# Patient Record
Sex: Male | Born: 1960 | Race: White | Hispanic: No | Marital: Single | Smoking: Current some day smoker
Health system: Southern US, Community
[De-identification: ages and names within clinical notes are randomized; demographics above are authoritative.]

## PROBLEM LIST (undated history)

## (undated) DIAGNOSIS — I10 Essential (primary) hypertension: Secondary | ICD-10-CM

## (undated) DIAGNOSIS — B1920 Unspecified viral hepatitis C without hepatic coma: Secondary | ICD-10-CM

## (undated) DIAGNOSIS — E785 Hyperlipidemia, unspecified: Secondary | ICD-10-CM

## (undated) DIAGNOSIS — IMO0001 Reserved for inherently not codable concepts without codable children: Secondary | ICD-10-CM

## (undated) DIAGNOSIS — K759 Inflammatory liver disease, unspecified: Secondary | ICD-10-CM

## (undated) DIAGNOSIS — M1990 Unspecified osteoarthritis, unspecified site: Secondary | ICD-10-CM

## (undated) DIAGNOSIS — Z972 Presence of dental prosthetic device (complete) (partial): Secondary | ICD-10-CM

## (undated) DIAGNOSIS — K219 Gastro-esophageal reflux disease without esophagitis: Secondary | ICD-10-CM

## (undated) HISTORY — DX: Essential (primary) hypertension: I10

## (undated) HISTORY — DX: Hyperlipidemia, unspecified: E78.5

## (undated) HISTORY — DX: Gastro-esophageal reflux disease without esophagitis: K21.9

## (undated) HISTORY — PX: NO PAST SURGERIES: SHX2092

---

## 2007-09-14 ENCOUNTER — Ambulatory Visit: Payer: Self-pay | Admitting: Family Medicine

## 2007-10-25 DIAGNOSIS — K759 Inflammatory liver disease, unspecified: Secondary | ICD-10-CM

## 2007-10-25 HISTORY — DX: Inflammatory liver disease, unspecified: K75.9

## 2008-05-01 DIAGNOSIS — R5381 Other malaise: Secondary | ICD-10-CM | POA: Insufficient documentation

## 2008-06-25 ENCOUNTER — Ambulatory Visit: Payer: Self-pay | Admitting: Gastroenterology

## 2008-08-08 ENCOUNTER — Ambulatory Visit: Payer: Self-pay | Admitting: Gastroenterology

## 2009-05-15 DIAGNOSIS — M545 Low back pain, unspecified: Secondary | ICD-10-CM | POA: Insufficient documentation

## 2009-07-08 DIAGNOSIS — D619 Aplastic anemia, unspecified: Secondary | ICD-10-CM | POA: Insufficient documentation

## 2009-07-14 DIAGNOSIS — M791 Myalgia, unspecified site: Secondary | ICD-10-CM | POA: Insufficient documentation

## 2009-12-15 DIAGNOSIS — R0602 Shortness of breath: Secondary | ICD-10-CM | POA: Insufficient documentation

## 2010-06-21 ENCOUNTER — Ambulatory Visit: Payer: Self-pay | Admitting: Family Medicine

## 2010-07-24 IMAGING — US ABDOMEN ULTRASOUND
1 series · 18 of 25 positions shown · non-contrast
Comparison: none

REASON FOR EXAM: abd pain
COMMENTS:

[Series 1: abdomen ultrasound · 18 of 72 slices shown]
[im 1/72]
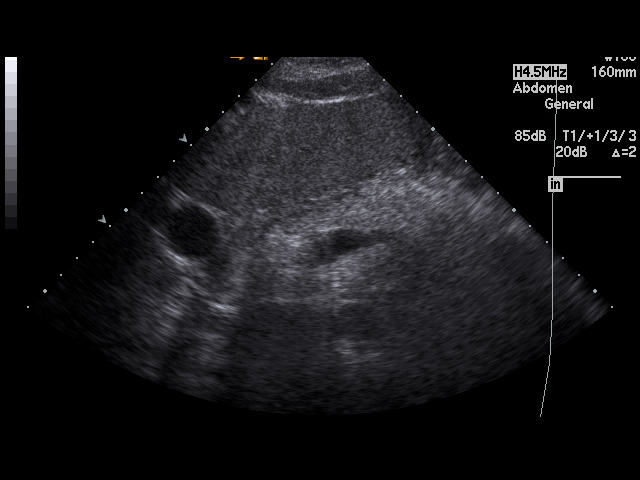
[im 6/72]
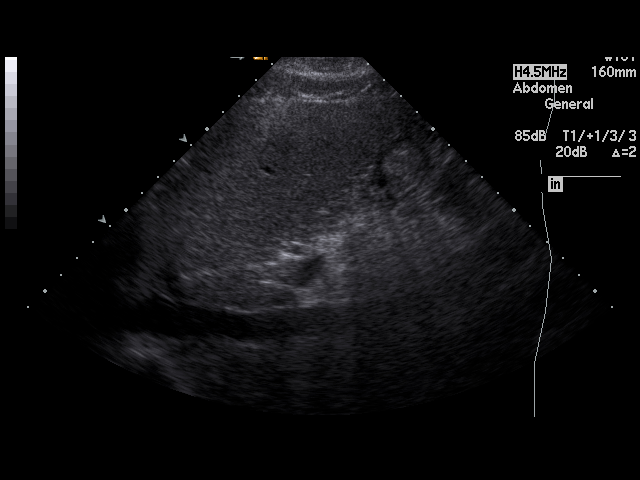
[im 9/72]
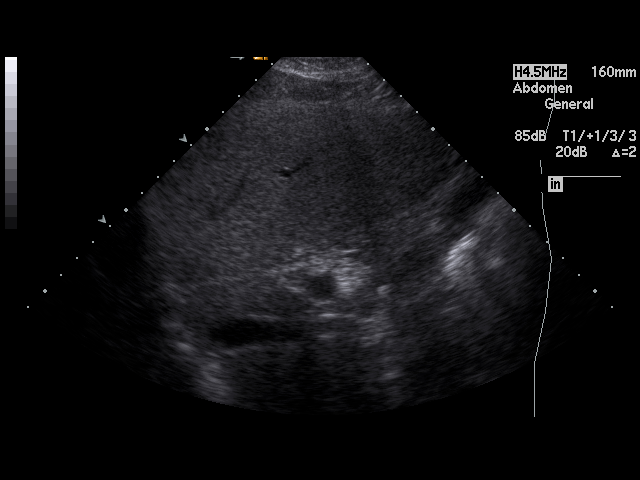
[im 12/72]
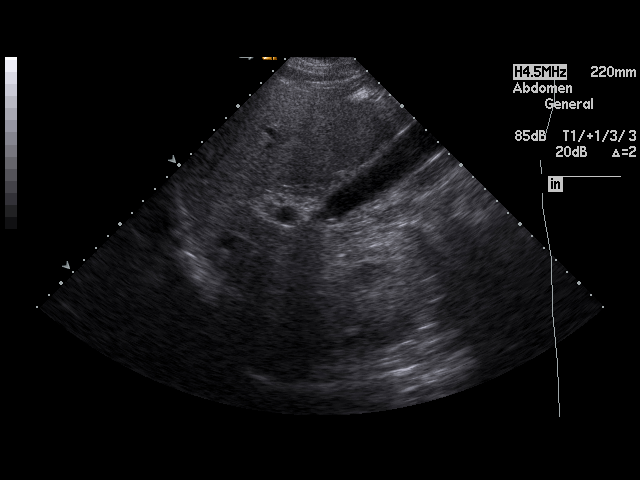
[im 18/72]
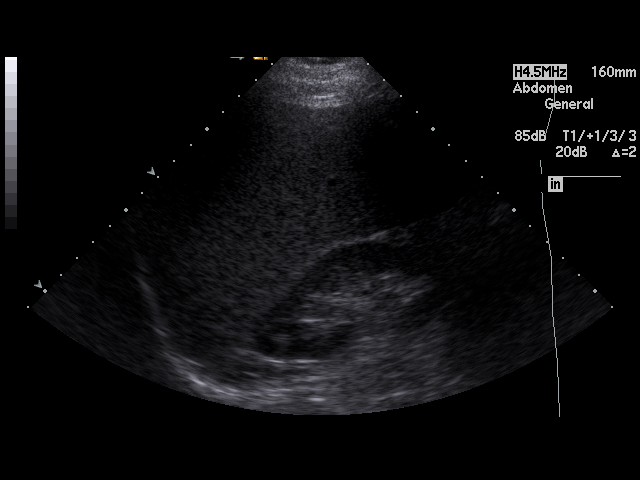
[im 21/72]
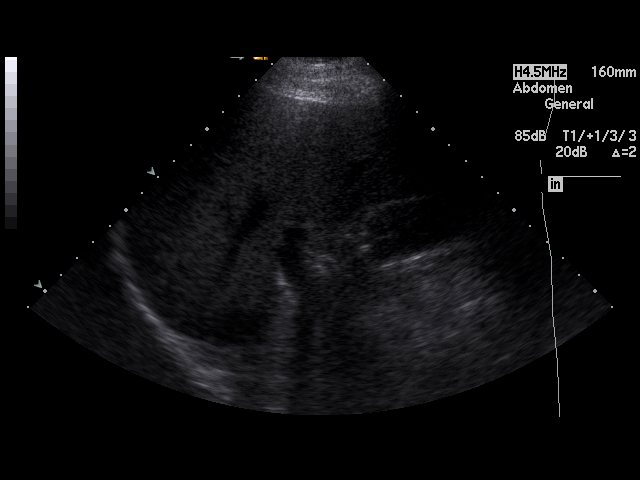
[im 27/72]
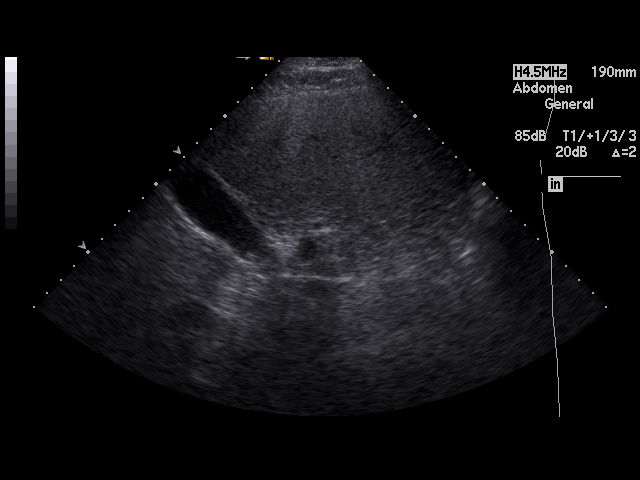
[im 30/72]
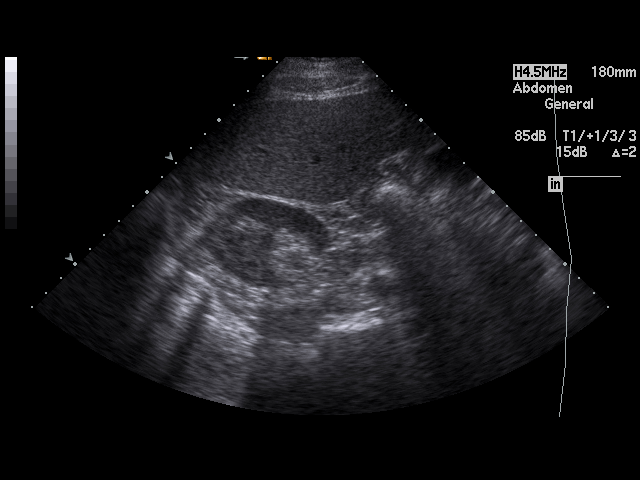
[im 33/72]
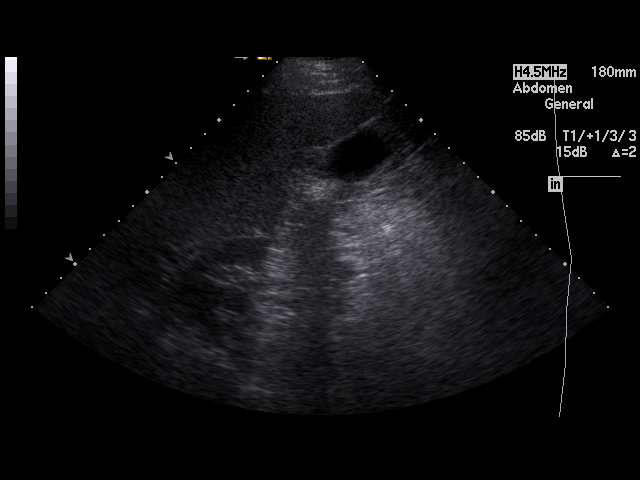
[im 39/72]
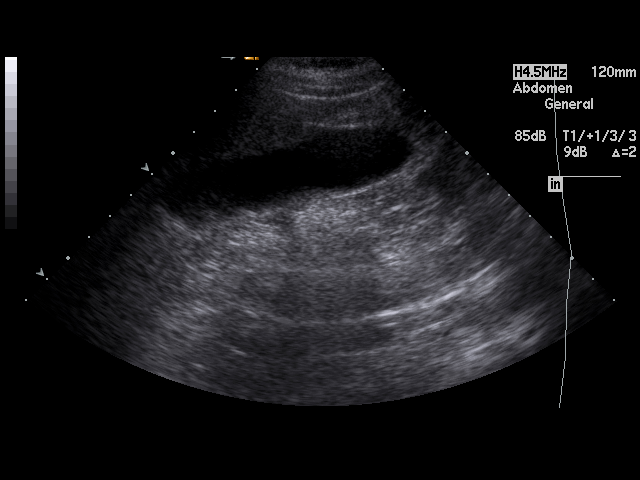
[im 42/72]
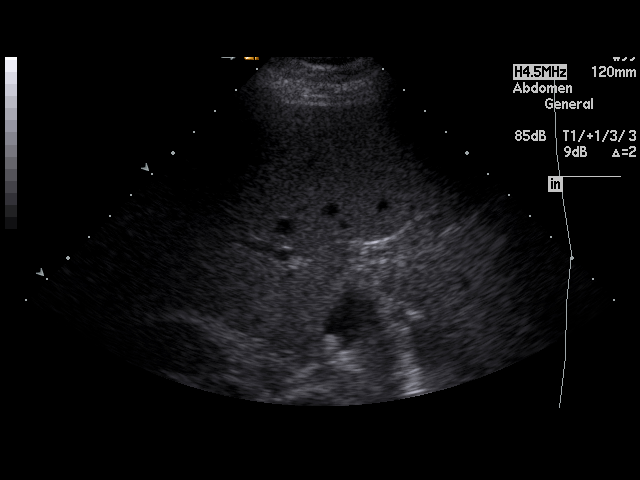
[im 45/72]
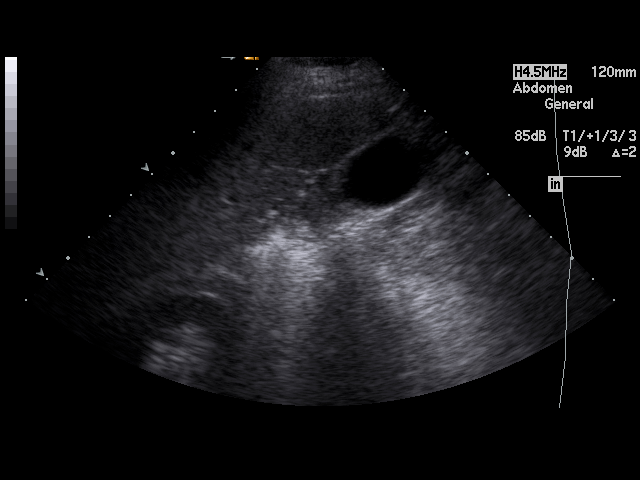
[im 51/72]
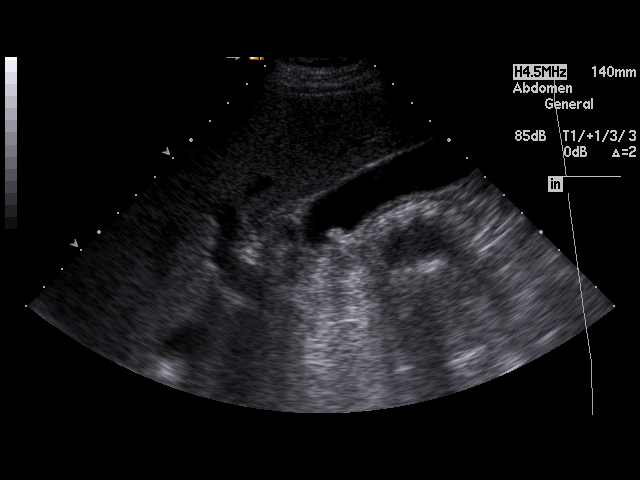
[im 54/72]
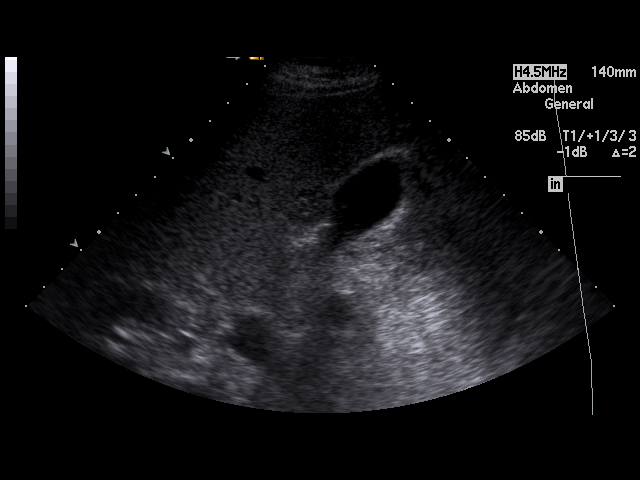
[im 60/72]
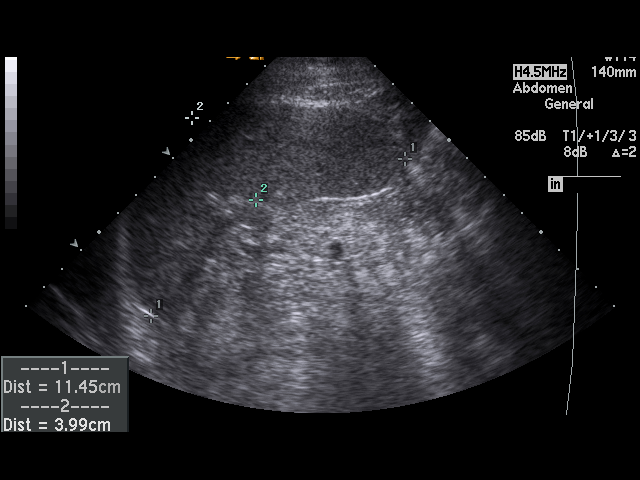
[im 63/72]
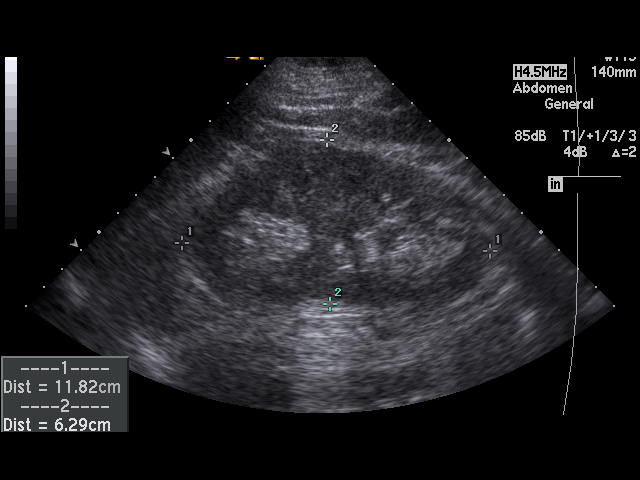
[im 66/72]
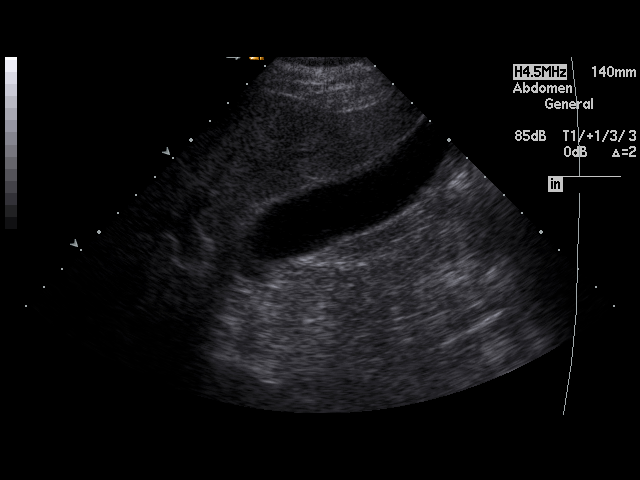
[im 72/72]
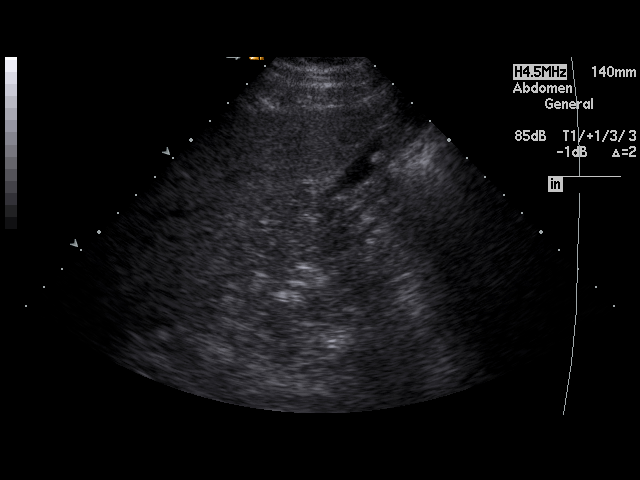

[18 of 25 positions shown; findings below may reference images not displayed]

PROCEDURE:     US  - US ABDOMEN GENERAL SURVEY  - June 25, 2008  [DATE]

RESULT:     The liver is normal.  The pancreas is partially visualized.
Gallstones are present. Gallbladder wall thickness is 1.7 mm.  The common
bile duct is 7 mm.  There is no hydronephrosis.  The RIGHT kidney measures
10.6 cm and the LEFT 11.8 cm.
IMPRESSION: Mobile gallstones.

## 2011-05-30 ENCOUNTER — Ambulatory Visit: Payer: Self-pay | Admitting: Family Medicine

## 2011-06-07 ENCOUNTER — Emergency Department: Payer: Self-pay | Admitting: Emergency Medicine

## 2012-01-06 ENCOUNTER — Emergency Department: Payer: Self-pay | Admitting: Emergency Medicine

## 2012-07-02 ENCOUNTER — Ambulatory Visit: Payer: Self-pay | Admitting: Family Medicine

## 2012-08-22 ENCOUNTER — Emergency Department: Payer: Self-pay

## 2012-08-22 LAB — URINALYSIS, COMPLETE
Ketone: NEGATIVE
Nitrite: NEGATIVE
Ph: 7 (ref 4.5–8.0)
Protein: NEGATIVE
RBC,UR: <1 /HPF (ref 0–5)
Specific Gravity: 1.002 (ref 1.003–1.030)

## 2012-08-22 LAB — CBC
HCT: 43.1 % (ref 40.0–52.0)
MCHC: 36 g/dL (ref 32.0–36.0)
RDW: 13.3 % (ref 11.5–14.5)
WBC: 7.8 10*3/uL (ref 3.8–10.6)

## 2012-08-22 LAB — COMPREHENSIVE METABOLIC PANEL
Albumin: 4.9 g/dL (ref 3.4–5.0)
Alkaline Phosphatase: 108 U/L (ref 50–136)
Anion Gap: 11 (ref 7–16)
Calcium, Total: 9.7 mg/dL (ref 8.5–10.1)
Co2: 26 mmol/L (ref 21–32)
Creatinine: 0.87 mg/dL (ref 0.60–1.30)
EGFR (African American): >60
Potassium: 3.4 mmol/L — ABNORMAL LOW (ref 3.5–5.1)
SGOT(AST): 22 U/L (ref 15–37)
SGPT (ALT): 53 U/L (ref 12–78)

## 2012-08-22 LAB — CK TOTAL AND CKMB (NOT AT ARMC)
CK, Total: 116 U/L (ref 35–232)
CK-MB: 1.4 ng/mL (ref 0.5–3.6)

## 2012-08-22 LAB — LIPASE, BLOOD: Lipase: 118 U/L (ref 73–393)

## 2013-03-14 ENCOUNTER — Ambulatory Visit: Payer: Self-pay | Admitting: Family Medicine

## 2014-01-08 DIAGNOSIS — I7300 Raynaud's syndrome without gangrene: Secondary | ICD-10-CM | POA: Insufficient documentation

## 2014-07-11 LAB — POCT ERYTHROCYTE SEDIMENTATION RATE, NON-AUTOMATED: SED RATE: 2 mm

## 2014-10-25 ENCOUNTER — Emergency Department: Payer: Self-pay | Admitting: Student

## 2014-12-15 ENCOUNTER — Ambulatory Visit: Payer: Self-pay | Admitting: Family Medicine

## 2015-02-10 NOTE — Consult Note (Signed)
PATIENT NAME:  Joshua Acevedo, Joshua Acevedo MR#:  [id number] DATE OF BIRTH:  August 05, 1961  DATE OF CONSULTATION:  08/23/2012  REFERRING PHYSICIAN:   CONSULTING PHYSICIAN:  Harrell Gave A. Jasmia Angst, MD  REASON FOR CONSULTATION: Suprapubic pain x1 day.   HISTORY OF PRESENT ILLNESS: Joshua Acevedo a pleasant 54 year old with a history of hepatitis (which he says is cured) who says that he was working today and developed suprapubic pain. He said it began on both sides. It is tender when he pushes on his abdomen and is worse with movement.  He said that his hands went numb at the same time.Marland Kitchen Has regular bowel movements, last one was today. He has never had a colonoscopy. He also is complaining of right foot pain for which he says is attributable to his gout.  Otherwise no nausea, vomiting, diarrhea, chest pain, shortness of breath, cough, fevers, or chills.   PAST MEDICAL HISTORY:  1. Hepatitis C. ? cured 2. Gout. 3. Osteoarthritis, which he says is secondary to the treatment that he got for hepatitis C.  4. Gastroesophageal reflux disease.  5. Hypercholesterolemia.   MEDICATIONS:  1. Pravastatin 20 mg p.o. daily.  2. Percocet 5/325 mg 2 tabs p.o. every 6 hours. 3. Nexium 40 mg p.o. daily.  4. Meloxicam 15 mg p.o. daily.  5. Flexeril 10 mg p.o. twice a day. 6. AndroGel topically.  7. Allopurinol 100 mg p.o. daily.   SOCIAL HISTORY: He is here with his wife. He lives in [place]. He has smoked one pack a day x30 years. Social drinker and smokes marijuana.   FAMILY HISTORY: Diabetes mellitus in his father as well as gout and coronary artery disease and hypertension in his mother.   REVIEW OF SYSTEMS: A complete 12 point review of systems was performed.  Pertinent positives and negatives as above.  PHYSICAL EXAMINATION:   VITAL SIGNS: Temperature 98.2, pulse 71, blood pressure 173/95, respirations 18, and saturation 96% on room air.   GENERAL: No acute distress, alert and oriented x3.   HEAD: Normocephalic,  atraumatic.   EYES: No scleral icterus. No conjunctivitis  FACE: Normal external ears. Normal external nose.  CHEST: Lungs clear to auscultation, moving air well.   ABDOMEN: Soft, nondistended. Tender when he pushes suprapubic, but no grimacing and no guarding and no rebound.   EXTREMITIES: Moves all extremities. Strength 5/5 in all four extremities.   PSYCH: Normal affect, pleasant mood.   LABORATORY, DIAGNOSTIC AND RADIOLOGIC DATA: I have reviewed all of Mr. Fleer' labs and his labs are significant for white cell count of 7.8 and hemoglobin and hematocrit 15.5 and 43.1. Potassium 3.4. Otherwise no lab abnormalities.   CT: I personally reviewed the CT scan. I do not visualize any acute diverticulitis, no suprapubic stranding. Appendix is visualized and appears normal size and of normal thickness, with contrast intraluminally.   Ultrasound shows gallstones with no pericholecystic fluid. No gallbladder wall thickening. Normal common bile duct. Normal thickness.  ASSESSMENT AND PLAN: Joshua Acevedo is pleasant 55 year old male with suprapubic pain. I do not see any obvious etiology to his abdominal pain at this time. I was consulted for gallstones and possible cholecystitis or symptomatic cholelithiasis. I do not see that his gallbladder is causing this problem. I have told him that he may follow up with me as an outpatient if he has gallbladder symptoms, which he does not at this time.  ____________________________ Glena Norfolk Kavan Devan, MD cal:slb D: 08/23/2012 06:46:51 ET T: 08/23/2012 09:37:35 ET JOB#: [id number]  cc: Harrell Gave A.  Collin Rengel, MD, <Dictator> Floyde Parkins MD ELECTRONICALLY SIGNED 08/24/2012 2:02

## 2015-02-27 LAB — CBC AND DIFFERENTIAL
HCT: 39 % — AB (ref 41–53)
Hemoglobin: 13.1 g/dL — AB (ref 13.5–17.5)
NEUTROS ABS: 47 /uL
Platelets: 205 10*3/uL (ref 150–399)
WBC: 7.4 10*3/mL

## 2015-02-27 LAB — HEPATIC FUNCTION PANEL
ALT: 16 U/L (ref 10–40)
AST: 19 U/L (ref 14–40)
Alkaline Phosphatase: 97 U/L (ref 25–125)
BILIRUBIN, TOTAL: 0.2 mg/dL

## 2015-02-27 LAB — LIPID PANEL
Cholesterol: 277 mg/dL — AB (ref 0–200)
HDL: 28 mg/dL — AB (ref 35–70)
LDL CALC: 219 mg/dL
TRIGLYCERIDES: 150 mg/dL (ref 40–160)

## 2015-02-27 LAB — BASIC METABOLIC PANEL
BUN: 7 mg/dL (ref 4–21)
Creatinine: 0.7 mg/dL (ref 0.6–1.3)
Glucose: 103 mg/dL
Potassium: 5.1 mmol/L (ref 3.4–5.3)
Sodium: 143 mmol/L (ref 137–147)

## 2015-02-27 LAB — TSH: TSH: 1.44 u[IU]/mL (ref 0.41–5.90)

## 2015-03-16 DIAGNOSIS — R6882 Decreased libido: Secondary | ICD-10-CM | POA: Insufficient documentation

## 2015-03-16 DIAGNOSIS — R5383 Other fatigue: Secondary | ICD-10-CM | POA: Insufficient documentation

## 2015-03-16 DIAGNOSIS — G894 Chronic pain syndrome: Secondary | ICD-10-CM | POA: Insufficient documentation

## 2015-03-16 DIAGNOSIS — R252 Cramp and spasm: Secondary | ICD-10-CM | POA: Insufficient documentation

## 2015-03-16 DIAGNOSIS — I10 Essential (primary) hypertension: Secondary | ICD-10-CM | POA: Insufficient documentation

## 2015-03-16 DIAGNOSIS — E559 Vitamin D deficiency, unspecified: Secondary | ICD-10-CM | POA: Insufficient documentation

## 2015-03-16 DIAGNOSIS — R7303 Prediabetes: Secondary | ICD-10-CM | POA: Insufficient documentation

## 2015-03-16 DIAGNOSIS — F1210 Cannabis abuse, uncomplicated: Secondary | ICD-10-CM | POA: Insufficient documentation

## 2015-03-16 DIAGNOSIS — I7300 Raynaud's syndrome without gangrene: Secondary | ICD-10-CM | POA: Insufficient documentation

## 2015-03-16 DIAGNOSIS — E7800 Pure hypercholesterolemia, unspecified: Secondary | ICD-10-CM | POA: Insufficient documentation

## 2015-03-16 DIAGNOSIS — K219 Gastro-esophageal reflux disease without esophagitis: Secondary | ICD-10-CM | POA: Insufficient documentation

## 2015-03-16 DIAGNOSIS — E291 Testicular hypofunction: Secondary | ICD-10-CM | POA: Insufficient documentation

## 2015-03-16 DIAGNOSIS — M2550 Pain in unspecified joint: Secondary | ICD-10-CM | POA: Insufficient documentation

## 2015-03-16 DIAGNOSIS — M109 Gout, unspecified: Secondary | ICD-10-CM | POA: Insufficient documentation

## 2015-03-16 DIAGNOSIS — M6530 Trigger finger, unspecified finger: Secondary | ICD-10-CM | POA: Insufficient documentation

## 2015-03-16 DIAGNOSIS — Z8261 Family history of arthritis: Secondary | ICD-10-CM | POA: Insufficient documentation

## 2015-03-16 DIAGNOSIS — R4586 Emotional lability: Secondary | ICD-10-CM | POA: Insufficient documentation

## 2015-03-26 ENCOUNTER — Other Ambulatory Visit: Payer: Self-pay | Admitting: Family Medicine

## 2015-04-03 ENCOUNTER — Other Ambulatory Visit: Payer: Self-pay | Admitting: Family Medicine

## 2015-04-03 MED ORDER — ALPRAZOLAM 0.5 MG PO TABS: 0.5000 mg | ORAL_TABLET | Freq: Three times a day (TID) | ORAL | Status: DC | PRN

## 2015-04-03 NOTE — Telephone Encounter (Signed)
Patient's wife Angie advised RX is ready for pick up.

## 2015-04-03 NOTE — Telephone Encounter (Signed)
Advise patient prescription will be at the front desk for pick up.

## 2015-04-03 NOTE — Telephone Encounter (Signed)
Patient's wife Janace Hoard) is calling requesting a refill on his Alprazolam. Please call patient @ [phone] when RX is ready for pick up or if you have any questions.

## 2015-04-14 ENCOUNTER — Other Ambulatory Visit: Payer: Self-pay | Admitting: Family Medicine

## 2015-04-14 MED ORDER — HYDROCODONE-ACETAMINOPHEN 10-325 MG PO TABS: 1.0000 | ORAL_TABLET | Freq: Four times a day (QID) | ORAL | Status: DC | PRN

## 2015-04-14 NOTE — Telephone Encounter (Signed)
Patient advised RX is ready for pick up at the front desk.

## 2015-04-14 NOTE — Telephone Encounter (Signed)
Refill printed and left at the front desk for pick up. Patient feels this routine use helps to maintain his mobility and control of chronic pain from myalgias and arthralgias secondary to Hepatitis C treatment with Interferon and Ribavirin.

## 2015-04-14 NOTE — Telephone Encounter (Signed)
Pt contacted office for refill request on the following medications:  HYDROcodone-acetaminophen (NORCO) 10-325 MG per tablet.  [id number]#[id number]

## 2015-04-17 ENCOUNTER — Encounter: Payer: Self-pay | Admitting: Family Medicine

## 2015-04-17 ENCOUNTER — Ambulatory Visit (INDEPENDENT_AMBULATORY_CARE_PROVIDER_SITE_OTHER): Payer: 59 | Admitting: Family Medicine

## 2015-04-17 ENCOUNTER — Telehealth: Payer: Self-pay | Admitting: Family Medicine

## 2015-04-17 VITALS — BP 132/76 | HR 80 | Temp 98.5°F | Resp 18 | Wt 153.8 lb

## 2015-04-17 DIAGNOSIS — M2550 Pain in unspecified joint: Secondary | ICD-10-CM | POA: Diagnosis not present

## 2015-04-17 DIAGNOSIS — L959 Vasculitis limited to the skin, unspecified: Secondary | ICD-10-CM

## 2015-04-17 NOTE — Telephone Encounter (Signed)
Please advise 

## 2015-04-17 NOTE — Telephone Encounter (Signed)
Will need to review actual differences in the yellow versus the white tablet with the pharmacist. Usually the tablets without color are less likely to have any extra inert ingredients to cause reactions. If having itching, may need to take a little Benadryl to counter any reaction. Should limit amount of salt in diet to be sure this does not cause fluid retention and swelling. Let us know if this continues over the weekend.

## 2015-04-17 NOTE — Progress Notes (Signed)
Subjective:    Patient ID: Joshua Acevedo, male    DOB: [birth year], 54 y.o.   MRN: [id number]  HPI Rash started after taking the first few doses of the new bottle of generic Norco 10-325 mg on 04-14-15. States he had to change pharmacies due to insurance requirement ([facility] to [facility]). He noticed the pills were different and did not seem to help any with his chronic pain ([facility] pills were [facility] - [facility] pills were white). Rash is painful and itchy around lower legs above ankles with tight sensation and puffiness around ankles. Wonders if the medicine is not as labeled or he is having an allergic reaction to the new generic.  Patient Active Problem List   Diagnosis Date Noted  . Arthralgia of multiple joints 03/16/2015  . Chronic pain associated with significant psychosocial dysfunction 03/16/2015  . Family history of arthritis 03/16/2015  . Fatigue 03/16/2015  . Acid reflux 03/16/2015  . Gout 03/16/2015  . Hypercholesteremia 03/16/2015  . Benign hypertension 03/16/2015  . Borderline diabetes 03/16/2015  . Decreased libido 03/16/2015  . 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 Cannabis abuse 03/16/2015  . Changeable mood 03/16/2015  . Cramp of limb 03/16/2015  . Acquired trigger finger 03/16/2015  . Raynaud's syndrome 03/16/2015  . Systemic lupus erythematosus (SLE) inhibitor 03/16/2015  . Avitaminosis D 03/16/2015  . Breathlessness on exertion 12/15/2009  . Muscle ache 07/14/2009  . Bone marrow failure 07/08/2009  . LBP (low back pain) 05/15/2009  . Hepatitis C virus infection without hepatic coma 06/02/2008  . Malaise and fatigue 05/01/2008   History  Substance Use Topics  . Smoking status: Current Every Day Smoker -- 1.00 packs/day for 40 years    Types: Cigarettes  . Smokeless tobacco: Never Used  . Alcohol Use: No     Comment: formerly was a heavy user   Past Surgical History  Procedure Laterality Date  . No past surgeries     Family History  Problem Relation Age  of Onset  . Diabetes Paternal Grandfather   . Heart attack Maternal Grandmother   . Heart attack Maternal Grandfather   . Hypertension Mother   . Cataracts Mother   . Diabetes Father   . Coronary artery disease Father    Current Outpatient Prescriptions on File Prior to Visit  Medication Sig Dispense Refill  . ALPRAZolam (XANAX) 0.5 MG tablet Take 1 tablet (0.5 mg total) by mouth 3 (three) times daily as needed. 90 tablet 1  . cholecalciferol (VITAMIN D) 1000 UNITS tablet Take 2 tablets by mouth daily.    . cyclobenzaprine (FLEXERIL) 10 MG tablet Take 1 tablet by mouth 2 (two) times daily as needed.    Marland Kitchen HYDROcodone-acetaminophen (NORCO) 10-325 MG per tablet Take 1-2 tablets by mouth 4 (four) times daily as needed. 180 tablet 0  . NIFEdipine (PROCARDIA-XL/ADALAT CC) 30 MG 24 hr tablet Take 1 tablet by mouth daily.    . Omeprazole 20 MG TBEC Take 2 tablets by mouth daily.    . pravastatin (PRAVACHOL) 40 MG tablet Take 1 tablet by mouth daily.     No current facility-administered medications on file prior to visit.   No Known Allergies     Review of Systems  Constitutional: Negative.   Respiratory: Negative.   Cardiovascular: Negative.   Gastrointestinal: Negative.   Skin: Positive for rash.  Neurological: Positive for headaches.  Psychiatric/Behavioral: Positive for agitation.      BP 132/76 mmHg  Pulse 80  Temp(Src) 98.5 F (36.9  C) (Oral)  Resp 18  Wt 153 lb 12.8 oz (69.763 kg)  Objective:   Physical Exam  Constitutional: He is oriented to person, place, and time. He appears well-developed and well-nourished. No distress.  HENT:  Head: Normocephalic and atraumatic.  Right Ear: Hearing normal.  Left Ear: Hearing normal.  Nose: Nose normal.  Eyes: Conjunctivae and lids are normal. Right eye exhibits no discharge. Left eye exhibits no discharge. No scleral icterus.  Pulmonary/Chest: Effort normal. No respiratory distress.  Musculoskeletal: Normal range of motion.   Neurological: He is alert and oriented to person, place, and time.  Skin: Skin is intact. Rash noted. No lesion noted.  Lacy red painful and itchy rash around lower legs above ankles. Ankles are puffy (R>L). Good pulses without blisters or open sores. No pain to palpate ankles or check ROM. No pitting edema.  Psychiatric: He has a normal mood and affect. His speech is normal and behavior is normal. Thought content normal.      Assessment & Plan:  1. Vasculitis of skin Onset of rash since starting a new generic Norco prescriptions on 04-14-15. No contact with new lotions, plants, foods, detergents, soaps, etc. Will treat with Triamcinolone 0.1% cream TID 30 gm and recheck in 1 week. Elevate legs as much as possible and restrict sun exposure over the weekend.  2. Arthralgia of multiple joints Pain worsening and puffiness of ankles since starting a new generic Norco prescription. Had him take the prescription back to the pharmacy to be sure the prescription was as labeled. Pharmacist advised [place] it was their generic brand of Norco (153 tablets and 8 [facility] brand Tylenol Arthritis tablets). Had him bring all the pills here and placed in disposal container for out dated samples and re-wrote prescription to pick up at a different pharmacy. With skin reaction, it is possible he had an allergic reaction or vasculitis of RA. Recheck in 1 week.

## 2015-04-17 NOTE — Telephone Encounter (Signed)
Patient advised as directed below. Patient verbalized understanding. Patient states he took the RX back to the pharmacy and the pharmacist said the difference between the white and yellow tablets are the additives in the medications.  Patient states the medication and not helping control he pain and he is swelling and has a rash since starting the medication.    Patient is requesting a new Hydrocodone RX to take to a different pharmacy even if he has to pay out of pocket for the medication. Advised patient that if Simona Huh was willing to give a new RX that he may have to bring the old RX up here for disposal because of being a narcotic. Patient verbalized understanding.

## 2015-04-17 NOTE — Telephone Encounter (Signed)
Pt states [facility] per [employer].  Pt states the Rx for Hydrocodone has changed from being a yellow pill to a white pill.  Pt states since he has been taking the white pills his legs are swollen, hand have a rash.  Pt also states the white pill is not helping with the pain.  Pt is asking if he could be having a reaction to the pills?  Pt states he can not come in today due to work.  VQ#[phone]

## 2015-04-17 NOTE — Telephone Encounter (Signed)
Pt stated his legs were swelling more and were tight. Michelle triaged the call and advised to get him in the [place] with [place] today. I scheduled pt with Simona Huh today @ 3. Thanks TNP

## 2015-04-17 NOTE — Telephone Encounter (Signed)
Pt states [facility] per [employer]. Pt states the Rx for Hydrocodone has changed from being a yellow pill to a white pill. Pt states since he has been taking the white pills his legs are swollen, hand have a rash. Pt also states the white pill is not helping with the pain. Pt is asking if he could be having a reaction to the pills? Pt states he can not come in today due to work. BD#[phone]/[id number]

## 2015-04-24 ENCOUNTER — Ambulatory Visit (INDEPENDENT_AMBULATORY_CARE_PROVIDER_SITE_OTHER): Payer: 59 | Admitting: Family Medicine

## 2015-04-24 ENCOUNTER — Telehealth: Payer: Self-pay | Admitting: Gastroenterology

## 2015-04-24 ENCOUNTER — Encounter: Payer: Self-pay | Admitting: Family Medicine

## 2015-04-24 VITALS — BP 126/72 | HR 78 | Temp 98.1°F | Resp 16 | Wt 149.0 lb

## 2015-04-24 DIAGNOSIS — E7800 Pure hypercholesterolemia, unspecified: Secondary | ICD-10-CM

## 2015-04-24 DIAGNOSIS — I10 Essential (primary) hypertension: Secondary | ICD-10-CM | POA: Diagnosis not present

## 2015-04-24 DIAGNOSIS — I776 Arteritis, unspecified: Secondary | ICD-10-CM

## 2015-04-24 NOTE — Telephone Encounter (Signed)
pcp Lelon Huh Triage They have the insurance corrected now

## 2015-04-24 NOTE — Progress Notes (Signed)
Subjective:     Patient ID: Joshua Acevedo, male   DOB: Mar 11, 1961, 54 y.o.   MRN: [id number]  Rash This is a new problem. The current episode started 1 to 4 weeks ago. The problem has been resolved since onset. It is unknown if there was an exposure to a precipitant. Pertinent negatives include no fever. (Patient reports that joint pain has improved ) The treatment provided significant relief.  Rash has resolve and only some tan coloration remains.  Hyperlipidemia Reports tolerating Pravastatin without additional arthralgias or myalgias (has chronic muscle and joint pains since treatment of Hepatitis C with Ribavirin and Interferon). Requests recheck of lipid panel to check progress. Patient Active Problem List   Diagnosis Date Noted  . Arthralgia of multiple joints 03/16/2015  . Chronic pain associated with significant psychosocial dysfunction 03/16/2015  . Family history of arthritis 03/16/2015  . Fatigue 03/16/2015  . Acid reflux 03/16/2015  . Gout 03/16/2015  . Hypercholesteremia 03/16/2015  . Benign hypertension 03/16/2015  . Borderline diabetes 03/16/2015  . Decreased libido 03/16/2015  . 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 Cannabis abuse 03/16/2015  . Changeable mood 03/16/2015  . Cramp of limb 03/16/2015  . Acquired trigger finger 03/16/2015  . Raynaud's syndrome 03/16/2015  . Systemic lupus erythematosus (SLE) inhibitor 03/16/2015  . Avitaminosis D 03/16/2015  . Breathlessness on exertion 12/15/2009  . Muscle ache 07/14/2009  . Bone marrow failure 07/08/2009  . LBP (low back pain) 05/15/2009  . Hepatitis C virus infection without hepatic coma 06/02/2008  . Malaise and fatigue 05/01/2008   Past Surgical History  Procedure Laterality Date  . No past surgeries     History  Substance Use Topics  . Smoking status: Current Every Day Smoker -- 1.00 packs/day for 40 years    Types: Cigarettes  . Smokeless tobacco: Never Used  . Alcohol Use: No     Comment: formerly was a  heavy user   Family History  Problem Relation Age of Onset  . Diabetes Paternal Grandfather   . Heart attack Maternal Grandmother   . Heart attack Maternal Grandfather   . Hypertension Mother   . Cataracts Mother   . Diabetes Father   . Coronary artery disease Father    Current Outpatient Prescriptions on File Prior to Visit  Medication Sig Dispense Refill  . ALPRAZolam (XANAX) 0.5 MG tablet Take 1 tablet (0.5 mg total) by mouth 3 (three) times daily as needed. 90 tablet 1  . cholecalciferol (VITAMIN D) 1000 UNITS tablet Take 2 tablets by mouth daily.    . cyclobenzaprine (FLEXERIL) 10 MG tablet Take 1 tablet by mouth 2 (two) times daily as needed.    Marland Kitchen NIFEdipine (PROCARDIA-XL/ADALAT CC) 30 MG 24 hr tablet Take 1 tablet by mouth daily.    . Omeprazole 20 MG TBEC Take 2 tablets by mouth daily.    . pravastatin (PRAVACHOL) 40 MG tablet Take 1 tablet by mouth daily.    Marland Kitchen HYDROcodone-acetaminophen (NORCO) 10-325 MG per tablet Take 1-2 tablets by mouth 4 (four) times daily as needed. (Patient not taking: Reported on 04/24/2015) 180 tablet 0   No current facility-administered medications on file prior to visit.   No Known Allergies  Review of Systems  Constitutional: Negative.  Negative for fever.  HENT: Negative.   Respiratory: Negative.   Cardiovascular: Negative.   Gastrointestinal: Negative.   Musculoskeletal: Positive for back pain and arthralgias.  Skin: Positive for rash.  Neurological: Negative.      BP 126/72 mmHg  Pulse 78  Temp(Src) 98.1 F (36.7 C)  Resp 16  Wt 149 lb (67.586 kg)   Objective:   Physical Exam  Constitutional: He is oriented to person, place, and time. He appears well-developed and well-nourished.  HENT:  Head: Normocephalic and atraumatic.  Nose: Nose normal.  Eyes: EOM are normal. Pupils are equal, round, and reactive to light.  Cardiovascular: Normal rate and regular rhythm.   Pulmonary/Chest: Effort normal and breath sounds normal.   Abdominal: Soft. Bowel sounds are normal.  Musculoskeletal:  No joint swelling. Ache and soreness of calves and feet today. Normal pulses.  Neurological: He is alert and oriented to person, place, and time.  Skin: Skin is warm and dry. Rash noted.  Vasculitis of lower legs fading with slight tan coloration remaining. No tenderness of skin.  Psychiatric:  Irritated but not anxious or angry today.      Assessment:     Vasculitis Discomfort and rash fading since he changed from the generic Norco that had a yellow dye in the tablets back to a plain white tablet of generic Norco and using Triamcinolone cream.  Hypercholesterolemia States he is tolerating Pravastatin and concerned about progress.  Lab Results  Component Value Date   CHOL 277* 02/27/2015   Lab Results  Component Value Date   HDL 28* 02/27/2015   Lab Results  Component Value Date   LDLCALC 219 02/27/2015   Lab Results  Component Value Date   TRIG 150 02/27/2015   Benign Hypertension    Tolerating Nifedipine (initially started for Raynaud's Phenomenon of fingers - no recurrence since established on this medication). No peripheral edema. Plan:    1. Vasculitis Resolving. Will recheck CBC for changes of infection. - CBC with Differential/Platelet  2. Hypercholesteremia Tolerating medications. Continue present dosage and work harder on low fat diet. Recheck lipids and metabolic panel with history of hepatitis. - Lipid panel - Comprehensive metabolic panel  3. Benign hypertension Very well controlled blood pressure. Continue Nifedipine and recheck pending lab reports. BP Readings from Last 3 Encounters:  04/24/15 126/72  04/17/15 132/76  12/15/14 132/82    - CBC with Differential/Platelet

## 2015-04-25 LAB — LIPID PANEL
Chol/HDL Ratio: 11 ratio units — ABNORMAL HIGH (ref 0.0–5.0)
Cholesterol, Total: 308 mg/dL — ABNORMAL HIGH (ref 100–199)
HDL: 28 mg/dL — AB (ref >39)
LDL CALC: 241 mg/dL — AB (ref 0–99)
Triglycerides: 195 mg/dL — ABNORMAL HIGH (ref 0–149)
VLDL Cholesterol Cal: 39 mg/dL (ref 5–40)

## 2015-04-25 LAB — COMPREHENSIVE METABOLIC PANEL
A/G RATIO: 1.8 (ref 1.1–2.5)
ALT: 28 IU/L (ref 0–44)
AST: 21 IU/L (ref 0–40)
Albumin: 4.8 g/dL (ref 3.5–5.5)
Alkaline Phosphatase: 112 IU/L (ref 39–117)
BUN/Creatinine Ratio: 8 — ABNORMAL LOW (ref 9–20)
BUN: 5 mg/dL — ABNORMAL LOW (ref 6–24)
CALCIUM: 10 mg/dL (ref 8.7–10.2)
CHLORIDE: 98 mmol/L (ref 97–108)
CO2: 30 mmol/L — ABNORMAL HIGH (ref 18–29)
Creatinine, Ser: 0.62 mg/dL — ABNORMAL LOW (ref 0.76–1.27)
GFR calc non Af Amer: 112 mL/min/{1.73_m2} (ref >59)
GFR, EST AFRICAN AMERICAN: 129 mL/min/{1.73_m2} (ref >59)
GLOBULIN, TOTAL: 2.6 g/dL (ref 1.5–4.5)
GLUCOSE: 68 mg/dL (ref 65–99)
Potassium: 4.4 mmol/L (ref 3.5–5.2)
Sodium: 142 mmol/L (ref 134–144)
Total Protein: 7.4 g/dL (ref 6.0–8.5)

## 2015-04-25 LAB — CBC WITH DIFFERENTIAL/PLATELET
BASOS ABS: 0 10*3/uL (ref 0.0–0.2)
BASOS: 0 %
EOS (ABSOLUTE): 0.1 10*3/uL (ref 0.0–0.4)
Eos: 1 %
HEMATOCRIT: 39 % (ref 37.5–51.0)
Hemoglobin: 13.5 g/dL (ref 12.6–17.7)
IMMATURE GRANULOCYTES: 0 %
Immature Grans (Abs): 0 10*3/uL (ref 0.0–0.1)
LYMPHS ABS: 3.5 10*3/uL — AB (ref 0.7–3.1)
Lymphs: 43 %
MCH: 30.3 pg (ref 26.6–33.0)
MCHC: 34.6 g/dL (ref 31.5–35.7)
MCV: 88 fL (ref 79–97)
Monocytes Absolute: 0.5 10*3/uL (ref 0.1–0.9)
Monocytes: 7 %
Neutrophils Absolute: 3.9 10*3/uL (ref 1.4–7.0)
Neutrophils: 49 %
PLATELETS: 340 10*3/uL (ref 150–379)
RBC: 4.45 x10E6/uL (ref 4.14–5.80)
RDW: 13.8 % (ref 12.3–15.4)
WBC: 8 10*3/uL (ref 3.4–10.8)

## 2015-04-30 ENCOUNTER — Telehealth: Payer: Self-pay | Admitting: Family Medicine

## 2015-04-30 ENCOUNTER — Telehealth: Payer: Self-pay

## 2015-04-30 DIAGNOSIS — E785 Hyperlipidemia, unspecified: Secondary | ICD-10-CM

## 2015-04-30 MED ORDER — ATORVASTATIN CALCIUM 20 MG PO TABS: 20.0000 mg | ORAL_TABLET | Freq: Every day | ORAL | Status: DC

## 2015-04-30 NOTE — Telephone Encounter (Signed)
Patient advised as directed below. Patient verbalized understanding and agrees with treatment plan. Patient states he takes the Pravastatin on a daily basis. Patient agrees to switch to Atorvastatin 40 mg. Pharmacy- [place] [facility].

## 2015-04-30 NOTE — Telephone Encounter (Signed)
Change sent to his pharmacy.

## 2015-04-30 NOTE — Telephone Encounter (Signed)
Pt is asking if the Rx for atorvastatin (LIPITOR) 20 MG is correct?  Pt states he was taking a different Rx that was 40mg .  Pharmacy only gave him 20mg .  [id number]#[phone]

## 2015-04-30 NOTE — Telephone Encounter (Signed)
LMTCB

## 2015-04-30 NOTE — Telephone Encounter (Signed)
-----   Message from [place], [place] sent at 04/30/2015 12:56 AM EDT ----- Normal liver function tests and no signs of anemia or infection. Cholesterol and triglycerides worse than last check 2 months ago. If taking Pravastatin 40 mg qd on a regular basis with lowering fats in diet, will need to switch to Atorvastatin 40 mg qd and recheck blood levels in 3 months.

## 2015-05-01 MED ORDER — ATORVASTATIN CALCIUM 40 MG PO TABS: 40.0000 mg | ORAL_TABLET | Freq: Every day | ORAL | Status: DC

## 2015-05-01 NOTE — Telephone Encounter (Signed)
Please advise. Previous message states will switch to Atorvastatin 40 mg.

## 2015-05-01 NOTE — Telephone Encounter (Signed)
Per Bartolo Darter for Atorvastatin 40 mg tablets sent to [facility]. Patient is aware that the RX should have been 40 mg instead of 20 mg.

## 2015-05-01 NOTE — Telephone Encounter (Signed)
Advised pharmacy the Atorvastatin order should have been 40 mg qd.

## 2015-05-07 NOTE — Telephone Encounter (Signed)
LVM for pt to return my call to schedule colonoscopy.  

## 2015-05-14 ENCOUNTER — Other Ambulatory Visit: Payer: Self-pay | Admitting: Family Medicine

## 2015-05-14 ENCOUNTER — Telehealth: Payer: Self-pay | Admitting: Family Medicine

## 2015-05-14 MED ORDER — HYDROCODONE-ACETAMINOPHEN 10-325 MG PO TABS: 1.0000 | ORAL_TABLET | Freq: Four times a day (QID) | ORAL | Status: DC | PRN

## 2015-05-14 NOTE — Telephone Encounter (Signed)
HYDROcodone-acetaminophen (NORCO) 10-325 MG per tablet needs written refill

## 2015-05-15 NOTE — Telephone Encounter (Signed)
Pt advised-aa 

## 2015-05-15 NOTE — Telephone Encounter (Signed)
See below

## 2015-05-15 NOTE — Telephone Encounter (Signed)
Prescription written and placed at the front desk file for pick-up.

## 2015-05-22 ENCOUNTER — Other Ambulatory Visit: Payer: Self-pay

## 2015-05-22 MED ORDER — CYCLOBENZAPRINE HCL 10 MG PO TABS: 10.0000 mg | ORAL_TABLET | Freq: Two times a day (BID) | ORAL | Status: DC | PRN

## 2015-05-22 NOTE — Telephone Encounter (Signed)
Patient's wife requesting a refill for Flexeril. Order pulled down and waiting for your approval-aa

## 2015-05-31 ENCOUNTER — Other Ambulatory Visit: Payer: Self-pay | Admitting: Family Medicine

## 2015-06-01 ENCOUNTER — Other Ambulatory Visit: Payer: Self-pay | Admitting: Family Medicine

## 2015-06-01 DIAGNOSIS — R4586 Emotional lability: Secondary | ICD-10-CM

## 2015-06-01 DIAGNOSIS — G894 Chronic pain syndrome: Secondary | ICD-10-CM

## 2015-06-01 MED ORDER — ALPRAZOLAM 0.5 MG PO TABS: 0.5000 mg | ORAL_TABLET | Freq: Three times a day (TID) | ORAL | Status: DC | PRN

## 2015-06-01 NOTE — Telephone Encounter (Signed)
Pt's wife contacted office for refill request on the following medications: ALPRAZolam (XANAX) 0.5 MG tablet to [facility]. Thanks TNP

## 2015-06-02 NOTE — Telephone Encounter (Signed)
Patient advised RX is at the front desk for pickup.  

## 2015-06-11 ENCOUNTER — Telehealth: Payer: Self-pay | Admitting: *Deleted

## 2015-06-11 ENCOUNTER — Telehealth: Payer: Self-pay | Admitting: Family Medicine

## 2015-06-11 DIAGNOSIS — G894 Chronic pain syndrome: Secondary | ICD-10-CM

## 2015-06-11 NOTE — Telephone Encounter (Signed)
Pt's wife is requesting a refill for HYDROcodone-acetaminophen ([place]) 10-325 MG per tablet because pt will run out Sunday and will need this for work on Monday. They would like to pick up the RX tomorrow 06/12/15. Thanks TNP

## 2015-06-11 NOTE — Telephone Encounter (Signed)
-----   Message from Birdie Sons, MD sent at 06/10/2015  4:16 PM EDT ----- Regarding: Appointment This is a patient of Dennis's that scheduled an appointment with me on Friday 8-19 because he wants me to prescribe him more pain medications.   Please advise patient that we would be happy to refer him to pain management clinic, but I will not be writing him prescriptions for any pain medications. Please cancel his appointment for Friday. May send order to [facility] for referral to pain clinic if he likes.

## 2015-06-11 NOTE — Telephone Encounter (Signed)
Patient's wife Janace Hoard was given the message. Wife stated that pt would like to see pain management.

## 2015-06-12 ENCOUNTER — Other Ambulatory Visit: Payer: Self-pay | Admitting: Family Medicine

## 2015-06-12 ENCOUNTER — Ambulatory Visit: Payer: 59 | Admitting: Family Medicine

## 2015-06-12 ENCOUNTER — Telehealth: Payer: Self-pay | Admitting: Family Medicine

## 2015-06-12 DIAGNOSIS — M2550 Pain in unspecified joint: Secondary | ICD-10-CM

## 2015-06-12 MED ORDER — HYDROCODONE-ACETAMINOPHEN 10-325 MG PO TABS: 1.0000 | ORAL_TABLET | Freq: Four times a day (QID) | ORAL | Status: DC | PRN

## 2015-06-12 NOTE — Telephone Encounter (Signed)
Pt's wife calling to see if husband's prescription is ready for pick up CB# [phone]. CC  HYDROcodone-acetaminophen (NORCO) 10-325 MG per tablet

## 2015-06-12 NOTE — Telephone Encounter (Signed)
Refilled and must proceed with Pain Management referral.

## 2015-06-12 NOTE — Telephone Encounter (Signed)
Patient will be out of medication over the weekend.

## 2015-06-12 NOTE — Telephone Encounter (Signed)
Pt called to request refill. I advised his wife requested it yesterday. Thanks TNP

## 2015-06-23 ENCOUNTER — Other Ambulatory Visit: Payer: Self-pay | Admitting: Family Medicine

## 2015-06-23 NOTE — Telephone Encounter (Signed)
Needs refill on NIFEdipine (PROCARDIA-XL/ADALAT CC) 30 MG 24 hr tablet 06/01/15 -- Margo Common, PA TAKE 1 TABLET BY MOUTH EVERY DAY  Uses [facility]  Thanks Con Memos

## 2015-06-23 NOTE — Telephone Encounter (Signed)
Patient's wife advised [facility] with 3 additional refills. Wife states they use a different pharmacy now. She will call to get the RX transferred to [facility]. If she is not able to transfer RX she will call back.

## 2015-06-25 ENCOUNTER — Encounter: Payer: Self-pay | Admitting: Pain Medicine

## 2015-06-25 ENCOUNTER — Telehealth: Payer: Self-pay | Admitting: Family Medicine

## 2015-06-25 ENCOUNTER — Ambulatory Visit: Payer: 59 | Attending: Pain Medicine | Admitting: Pain Medicine

## 2015-06-25 VITALS — BP 149/88 | HR 90 | Temp 98.4°F | Resp 18 | Ht 66.0 in | Wt 150.0 lb

## 2015-06-25 DIAGNOSIS — R7309 Other abnormal glucose: Secondary | ICD-10-CM | POA: Diagnosis not present

## 2015-06-25 DIAGNOSIS — B1920 Unspecified viral hepatitis C without hepatic coma: Secondary | ICD-10-CM | POA: Insufficient documentation

## 2015-06-25 DIAGNOSIS — F17200 Nicotine dependence, unspecified, uncomplicated: Secondary | ICD-10-CM | POA: Diagnosis not present

## 2015-06-25 DIAGNOSIS — M797 Fibromyalgia: Secondary | ICD-10-CM | POA: Insufficient documentation

## 2015-06-25 DIAGNOSIS — I7300 Raynaud's syndrome without gangrene: Secondary | ICD-10-CM | POA: Insufficient documentation

## 2015-06-25 DIAGNOSIS — M542 Cervicalgia: Secondary | ICD-10-CM | POA: Diagnosis present

## 2015-06-25 DIAGNOSIS — M329 Systemic lupus erythematosus, unspecified: Secondary | ICD-10-CM | POA: Insufficient documentation

## 2015-06-25 DIAGNOSIS — I10 Essential (primary) hypertension: Secondary | ICD-10-CM | POA: Insufficient documentation

## 2015-06-25 DIAGNOSIS — M069 Rheumatoid arthritis, unspecified: Secondary | ICD-10-CM | POA: Insufficient documentation

## 2015-06-25 DIAGNOSIS — M546 Pain in thoracic spine: Secondary | ICD-10-CM | POA: Diagnosis present

## 2015-06-25 NOTE — Progress Notes (Signed)
Safety precautions to be maintained throughout the outpatient stay will include: orient to surroundings, keep bed in low position, maintain call bell within reach at all times, provide assistance with transfer out of bed and ambulation.  

## 2015-06-25 NOTE — Patient Instructions (Signed)
PLAN   Continue present medications  F/U PCP Dr Natale Milch   for evaliation of  BP and general medical  condition  F/U surgical evaluation. May consider pending follow-up evaluations  F/U neurological evaluation. May consider pending follow-up evaluations  F/U rheumatological evaluation   May consider radiofrequency rhizolysis or intraspinal procedures pending response to present treatment and F/U evaluation   Patient to call Pain Management Center should patient have concerns prior to scheduled return appointment.

## 2015-06-25 NOTE — Progress Notes (Signed)
Subjective:    Patient ID: Joshua Acevedo, male    DOB: 21-Jan-1961, 54 y.o.   MRN: [id number]  HPI  Patient is 54 year old gentleman who comes to pain management Center at the request of Dr. Lelon Huh for further evaluation and treatment of pain involving the neck and entire back upper and lower extremity regions as well as headaches. Patient has diagnosis of systemic lupus erythematosus  Raynaud's syndrome osteoarthritis hepatitis C with multiple arthralgias and myalgias. Patient described his pain as aching agonizing unknowing cramping cool sensation that was toothache-like sensation and uncomfortable patient's pain was awakens him from sleep and is associated with spasms as well as weakness patient states the pain increases with sitting standing squatting stooping bending kneeling lifting walking and working. Pain has decreased with warm showers and baths and medications we discussed patient's condition on today's visit and will consider modifications of treatment regimen pending further assessment of patient's condition. We will reevaluate patient at time return appointment as well as discussed his medications and will consider treatment of patient or referral to a tertiary center. The patient was understanding and agreement with suggested treatment plan       Review of Systems   Cardiovascular High blood pressure  Pulmonary Cigarette smoking  Neurological Unremarkable  Psychological Unremarkable  Gastrointestinal Hepatitis C  Genitourinary Unremarkable  Endocrine Unremarkable  Rheumatological Osteoarthritis Rheumatoid arthritis  Musculoskeletal Unremarkable  Other significant Unremarkable       Objective:   Physical Exam  There was tends to palpation of the splenius capitis and occipitalis musculature regions of moderate degree. No new masses of the head and neck were noted. Patient appeared to be with slightly decreased grip strength. Tinel and Phalen's  maneuver were without increased pain of significant degree. There was tenderness over the cervical facet cervical paraspinal musculature region of moderate degree. There was moderate tenderness of the thoracic facet thoracic paraspinal musculature region. No crepitus of the thoracic region was noted. Palpation of the lumbar paraspinal musculature region lumbar facet region associated with increased pain of moderate degree with lateral bending and rotation and extension and palpation over the lumbar facets reproducing moderate discomfort. There was tenderness over the PSIS and PII S regions as well as the gluteal and piriformis musculature regions of moderate degree as well there was moderate tenderness of the greater trochanteric region iliotibial band region. Straight leg raising was tolerates approximately 30 without a definite increased pain with dorsiflexion noted. There was negative clonus negative Homans. DTRs were difficult to elicit patient had difficulty relaxing. Abdomen soft nontender no costovertebral angle tenderness was noted.    Assessment & Plan:    Systemic lupus erythematosus Raynaud's syndrome Fibromyalgia Osteoarthritis Hepatitis C Borderline diabetes mellitus   PLAN   Continue present medications  F/U PCP Dr. Caryn Section for evaliation of  BP and general medical  condition  F/U surgical evaluation. May consider pending follow-up evaluations  F/U neurological evaluation. May consider pending follow-up evaluations  Rheumatological evaluation as discussed  Psych evaluation was addressed on today's visit. Patient is with history of marijuana use. We informed patient that patient must discontinue the use of marijuana if he wishes to be treated in a pain management Center. The patient stated that he would stop marijuana use. We discussed psych evaluation and will observe urine drug screens and consider further evaluation of patient or discharge patient from [facility]  pending follow-up evaluations. Patient must discontinue use of marijuana if patient wishes to be treated and Pain Management  Center. Patient is understanding and agrees with suggested plan of treatment  May consider radiofrequency rhizolysis or intraspinal procedures pending response to present treatment and F/U evaluation   Patient to call Pain Management Center should patient have concerns prior to scheduled return appointment.

## 2015-06-25 NOTE — Telephone Encounter (Signed)
Pt's wife called to see if we had heard anything from the pain clinic. Wife stated he went there this morning and was told they wanted [place] to change pt's medications and they were going to send [place] a fax. Wife wanted to know if we had received any information & if we had any medications ready to be picked up. Thanks TNP

## 2015-06-26 NOTE — Telephone Encounter (Signed)
Not received any recommendations for changes yet. Dr. Primus Bravo may make changes at his follow up appointment.

## 2015-06-26 NOTE — Telephone Encounter (Signed)
Pt called again to see if we had heard anything form the pain clinic.

## 2015-07-02 ENCOUNTER — Telehealth: Payer: Self-pay

## 2015-07-02 ENCOUNTER — Telehealth: Payer: Self-pay | Admitting: Pain Medicine

## 2015-07-02 ENCOUNTER — Other Ambulatory Visit: Payer: Self-pay | Admitting: Pain Medicine

## 2015-07-02 NOTE — Telephone Encounter (Signed)
Pt would like to talk to someone about meds

## 2015-07-02 NOTE — Telephone Encounter (Signed)
Patient asking if we have heard from Dr. Mattie Marlin office about prescribing meds. Informed patient that we did get permission from Dr. Freddi Starr to prescribe medications.

## 2015-07-02 NOTE — Telephone Encounter (Signed)
Patient's wife Anajette Hirata advised as directed below. Wife verbalized understanding, and states she will advise patient of message below.

## 2015-07-02 NOTE — Telephone Encounter (Signed)
Patient states he has a follow up appointment with Dr. Primus Bravo on 07/28/15. Patient states that his Hydrocodone will run out on 9/18. Patient is requesting a RX until he sees Dr. Primus Bravo on 10/4. Please advise.

## 2015-07-02 NOTE — Telephone Encounter (Signed)
If Dr. Primus Bravo agrees, I could have it ready for pick up on 07-10-15.

## 2015-07-02 NOTE — Telephone Encounter (Signed)
Contacted [facility] pain clinic spoke with [place]. Per Tye Maryland patient does have a follow up appointment on 07/28/15, but she did not see in patient's chart where Dr. Primus Bravo agreed to start prescribing patient's medication at that time. Tye Maryland states typically Dr. Primus Bravo doe not start prescribing/treatment until after the first few office visits. Dr. Primus Bravo will send a documentation to [facility] when/if he starts prescribing for the patient.

## 2015-07-10 ENCOUNTER — Telehealth: Payer: Self-pay | Admitting: Pain Medicine

## 2015-07-10 MED ORDER — HYDROCODONE-ACETAMINOPHEN 10-325 MG PO TABS: 1.0000 | ORAL_TABLET | Freq: Four times a day (QID) | ORAL | Status: DC | PRN

## 2015-07-10 NOTE — Telephone Encounter (Signed)
Spoke with Dr. Primus Bravo, we do have permission from Dr. Jeananne Rama to prescribe.

## 2015-07-10 NOTE — Telephone Encounter (Signed)
Signed form from Dr. Primus Bravo to agree to him prescribing for this patient after his initial evaluation process. Will refill medication until this is accomplished.

## 2015-07-10 NOTE — Telephone Encounter (Signed)
RX at front desk for pickup. Left patient a voicemail advising him that RX is at front desk for pick up.

## 2015-07-10 NOTE — Telephone Encounter (Signed)
Pt stopped by and was wanting to talk to Dr Primus Bravo about getting a refill on meds , pt has only seen Dr Primus Bravo once. Dr Jeananne Rama has been prescribing.

## 2015-07-14 NOTE — Telephone Encounter (Signed)
error 

## 2015-07-20 ENCOUNTER — Telehealth: Payer: Self-pay | Admitting: Gastroenterology

## 2015-07-20 NOTE — Telephone Encounter (Signed)
Colonoscopy triage Call at work [phone]

## 2015-07-23 ENCOUNTER — Other Ambulatory Visit: Payer: Self-pay

## 2015-07-23 DIAGNOSIS — Z1211 Encounter for screening for malignant neoplasm of colon: Secondary | ICD-10-CM

## 2015-07-23 DIAGNOSIS — Z1212 Encounter for screening for malignant neoplasm of rectum: Principal | ICD-10-CM

## 2015-07-23 MED ORDER — NA SULFATE-K SULFATE-MG SULF 17.5-3.13-1.6 GM/177ML PO SOLN: 1.0000 | ORAL | Status: DC

## 2015-07-23 NOTE — Telephone Encounter (Signed)
Gastroenterology Pre-Procedure Review  Request Date: 08/28/15 Requesting Physician: Vernie Murders, PA  PATIENT REVIEW QUESTIONS: The patient responded to the following health history questions as indicated:    1. Are you having any GI issues? no 2. Do you have a personal history of Polyps? no 3. Do you have a family history of Colon Cancer or Polyps? no 4. Diabetes Mellitus? no 5. Joint replacements in the past 12 months?no 6. Major health problems in the past 3 months?no 7. Any artificial heart valves, MVP, or defibrillator?no    MEDICATIONS & ALLERGIES:    Patient reports the following regarding taking any anticoagulation/antiplatelet therapy:   Plavix, Coumadin, Eliquis, Xarelto, Lovenox, Pradaxa, Brilinta, or Effient? no Aspirin? no  Patient confirms/reports the following medications:  Current Outpatient Prescriptions  Medication Sig Dispense Refill  . ALPRAZolam (XANAX) 0.5 MG tablet Take 1 tablet (0.5 mg total) by mouth 3 (three) times daily as needed. 90 tablet 1  . atorvastatin (LIPITOR) 40 MG tablet Take 1 tablet (40 mg total) by mouth daily. 30 tablet 3  . cholecalciferol (VITAMIN D) 1000 UNITS tablet Take 2 tablets by mouth daily.    . cyclobenzaprine (FLEXERIL) 10 MG tablet Take 1 tablet (10 mg total) by mouth 2 (two) times daily as needed. 60 tablet 1  . fluticasone (FLONASE) 50 MCG/ACT nasal spray Place 2 sprays into both nostrils daily.    [place] HYDROcodone-acetaminophen (NORCO) 10-325 MG per tablet Take 1-2 tablets by mouth 4 (four) times daily as needed. 180 tablet 0  . Omeprazole 20 MG TBEC Take 2 tablets by mouth daily.     No current facility-administered medications for this visit.    Patient confirms/reports the following allergies:  No Known Allergies  No orders of the defined types were placed in this encounter.    AUTHORIZATION INFORMATION Primary Insurance: 1D#: Group #:  Secondary Insurance: 1D#: Group #:  SCHEDULE INFORMATION: Date: 08/28/15   Time: Location: [place]

## 2015-07-24 ENCOUNTER — Other Ambulatory Visit: Payer: Self-pay | Admitting: Family Medicine

## 2015-07-24 NOTE — Telephone Encounter (Signed)
Please call in alprazolam.  

## 2015-07-24 NOTE — Telephone Encounter (Signed)
Called in medication. Advised patient.

## 2015-07-24 NOTE — Telephone Encounter (Signed)
Joshua Acevedo patient 

## 2015-07-28 ENCOUNTER — Encounter: Payer: Self-pay | Admitting: Pain Medicine

## 2015-07-28 ENCOUNTER — Ambulatory Visit: Payer: 59 | Attending: Pain Medicine | Admitting: Pain Medicine

## 2015-07-28 VITALS — BP 138/69 | HR 81 | Temp 98.1°F | Resp 18 | Ht 66.0 in | Wt 152.0 lb

## 2015-07-28 DIAGNOSIS — R7309 Other abnormal glucose: Secondary | ICD-10-CM | POA: Insufficient documentation

## 2015-07-28 DIAGNOSIS — B1920 Unspecified viral hepatitis C without hepatic coma: Secondary | ICD-10-CM | POA: Insufficient documentation

## 2015-07-28 DIAGNOSIS — M329 Systemic lupus erythematosus, unspecified: Secondary | ICD-10-CM | POA: Diagnosis not present

## 2015-07-28 DIAGNOSIS — M546 Pain in thoracic spine: Secondary | ICD-10-CM | POA: Diagnosis present

## 2015-07-28 DIAGNOSIS — I7300 Raynaud's syndrome without gangrene: Secondary | ICD-10-CM | POA: Diagnosis not present

## 2015-07-28 DIAGNOSIS — M797 Fibromyalgia: Secondary | ICD-10-CM | POA: Insufficient documentation

## 2015-07-28 DIAGNOSIS — M1990 Unspecified osteoarthritis, unspecified site: Secondary | ICD-10-CM | POA: Diagnosis not present

## 2015-07-28 DIAGNOSIS — M542 Cervicalgia: Secondary | ICD-10-CM | POA: Diagnosis present

## 2015-07-28 DIAGNOSIS — M5136 Other intervertebral disc degeneration, lumbar region: Secondary | ICD-10-CM

## 2015-07-28 DIAGNOSIS — M533 Sacrococcygeal disorders, not elsewhere classified: Secondary | ICD-10-CM

## 2015-07-28 NOTE — Patient Instructions (Addendum)
PLAN   Continue present medication  F/U PCP Dr Natale Milch for evaliation of  BP and general medical  condition  F/U surgical evaluation. May consider pending follow-up evaluations  F/U neurological evaluation. May consider pending follow-up evaluations  Please ask Caryl Pina date of your lumbar MRI  Psych evaluation as discussed. Please ask Caryl Pina date of psych evaluation  May consider radiofrequency rhizolysis or intraspinal procedures pending response to present treatment and F/U evaluation   Patient to call Pain Management Center should patient have concerns prior to scheduled return appointment.

## 2015-07-28 NOTE — Progress Notes (Signed)
Safety precautions to be maintained throughout the outpatient stay will include: orient to surroundings, keep bed in low position, maintain call bell within reach at all times, provide assistance with transfer out of bed and ambulation.  

## 2015-07-28 NOTE — Progress Notes (Signed)
Subjective:    Patient ID: Joshua Acevedo, male    DOB: [birth year], 54 y.o.   MRN: [id number]  HPI Patient is 54 year old gentleman who returns to Pain Management Center for further evaluation and treatment of pain involving the neck and entire back upper and lower extremity regions. Patient states that he has to get up 3 hours early to get going to work. Patient is continuing to work despite his pain. Patient states that the pain is severe in the morning and that he has to take medication as and sit and watch the news while the medication works patient eventually is able to dress and get ready to go to work. Patient denies any recent trauma change in events of daily living the cause of the patient's symptoms to change. We discussed patient's condition and will repeat urine drug screen on today's visit. We've discussed the need for patient to avoid use of marijuana. We will need to have urine drug screen without evidence of marijuana prior to beginning to prescribed medications for treatment of patient's pain. The patient will continue to avoid use of marijuana and we will proceed with additional studies at this time including studies of the spine. Patient will return for follow-up evaluation and will consider modification of treatment regimen and will review urine drug screen and determine if we are going to continue treatment of patient.We also discussed patient undergoing psych evaluation and patient will proceed with psych evaluation is discussed as wellThe patient was understanding and in agreement with suggested treatment plan.   Review of Systems     Objective:   Physical Exam  There was tends to palpation of the splenius capitis and occipitalis musculature region a moderate degree. No new masses of the head and neck were noted. There was tennis of the acromioclavicular and glenohumeral joint region a moderate degree. Patient appeared to be with bilaterally equal grip strength. Tinel and  Phalen's maneuver associated with mild discomfort. There was tends to palpation of the thoracic facet thoracic paraspinal musculature region with evidence of moderate muscle spasms of the thoracic region. No crepitus of the thoracic region noted. Palpation of the lumbar paraspinal musculature region lumbar facet region was a tends to palpation of moderate degree. Lateral bending and rotation and extension and palpation of the lumbar facets reproduce moderate discomfort. Straight leg raising was tolerates approximately 20 without increased pain with dorsiflexion noted. There appeared to be negative clonus negative Homans. There was moderate tenderness over the PSIS and PII S regions as well as the gluteal and piriformis musculature regions. Palpation of the greater trochanteric region iliotibial band region was with moderate tends to palpation. There was moderate to moderately severe increase of pain with pressure applied to the ilium with patient in lateral decubitus position EHL strength appeared to be decreased. DTRs were difficult to elicit patient had difficulty relaxing. No definite sensory deficit of dermatomal distribution was detected. There was no clonus negative Homans. Abdomen nontender with no costovertebral maintenance noted.      Assessment & Plan:    Systemic lupus erythematosus Raynaud's syndrome Fibromyalgia Osteoarthritis Hepatitis C Borderline diabetes     PLAN   Continue present medication  F/U PCP for evaliation of  BP and general medical  condition  F/U surgical evaluation. May consider pending follow-up evaluations  F/U neurological evaluation. May consider pending follow-up evaluations  May consider radiofrequency rhizolysis or intraspinal procedures pending  response to present treatment and F/U evaluation   Lumbar MRI scheduled  Psych  evaluation as planned  Patient to call Pain Management Center should patient have concerns prior to scheduled return  appointment.

## 2015-07-30 ENCOUNTER — Telehealth: Payer: Self-pay | Admitting: Family Medicine

## 2015-07-30 NOTE — Telephone Encounter (Signed)
Pt's wife called asking the correct MG of Lipitor.  States pt has always taken 40 MG but when he went to get RX filled they gave him 20MG  and the pain clinic had on their paperwork 20MG .  She and the patient would like to know what is the correct MG.

## 2015-08-03 ENCOUNTER — Other Ambulatory Visit: Payer: Self-pay | Admitting: Pain Medicine

## 2015-08-04 ENCOUNTER — Telehealth: Payer: Self-pay | Admitting: Pain Medicine

## 2015-08-04 ENCOUNTER — Other Ambulatory Visit: Payer: Self-pay | Admitting: Family Medicine

## 2015-08-04 NOTE — Telephone Encounter (Signed)
Spoke with Dr. Primus Bravo, instructed to have patient come Monday at 0700. Attempted to call patient, left message at home for patient to call us.  Please put patient on the schedule for Monday, October 17 at 0700

## 2015-08-04 NOTE — Telephone Encounter (Signed)
Patient says he is out of meds now and cannot work with out them / can we call his pcp and let them know we have not taken over meds yet and would they write meds until nov appt Call him at work [phone]

## 2015-08-04 NOTE — Telephone Encounter (Signed)
Will be out of meds today  / next appt 08-25-15 does he get refills here or from previous phys

## 2015-08-06 ENCOUNTER — Other Ambulatory Visit: Payer: Self-pay | Admitting: Family Medicine

## 2015-08-06 DIAGNOSIS — G894 Chronic pain syndrome: Secondary | ICD-10-CM

## 2015-08-06 NOTE — Telephone Encounter (Signed)
Last ov was on 06/12/2015. Last refill for this medication was on 07/10/2015.  Thanks,

## 2015-08-06 NOTE — Telephone Encounter (Signed)
Pt contacted office for refill request on the following medications:  HYDROcodone-acetaminophen ([place]) 10-325 MG.  Pt is requesting enough to get him through until Monday.  Pt will be seen in the pain clinic on Monday. Pt states he only has 1 pill left.  CB#[phone]/MW

## 2015-08-10 ENCOUNTER — Ambulatory Visit: Payer: 59 | Attending: Pain Medicine | Admitting: Pain Medicine

## 2015-08-10 ENCOUNTER — Encounter: Payer: Self-pay | Admitting: Pain Medicine

## 2015-08-10 ENCOUNTER — Other Ambulatory Visit: Payer: Self-pay | Admitting: Family Medicine

## 2015-08-10 ENCOUNTER — Encounter: Payer: Self-pay | Admitting: Family Medicine

## 2015-08-10 ENCOUNTER — Other Ambulatory Visit: Payer: Self-pay | Admitting: Pain Medicine

## 2015-08-10 ENCOUNTER — Ambulatory Visit (INDEPENDENT_AMBULATORY_CARE_PROVIDER_SITE_OTHER): Payer: 59 | Admitting: Family Medicine

## 2015-08-10 VITALS — BP 130/93 | HR 86 | Temp 96.7°F | Resp 14 | Ht 66.0 in | Wt 150.0 lb

## 2015-08-10 VITALS — BP 158/88 | HR 84 | Temp 97.7°F | Resp 16 | Wt 155.0 lb

## 2015-08-10 DIAGNOSIS — M1990 Unspecified osteoarthritis, unspecified site: Secondary | ICD-10-CM | POA: Diagnosis not present

## 2015-08-10 DIAGNOSIS — I7300 Raynaud's syndrome without gangrene: Secondary | ICD-10-CM | POA: Diagnosis not present

## 2015-08-10 DIAGNOSIS — M542 Cervicalgia: Secondary | ICD-10-CM | POA: Diagnosis present

## 2015-08-10 DIAGNOSIS — B1920 Unspecified viral hepatitis C without hepatic coma: Secondary | ICD-10-CM | POA: Diagnosis not present

## 2015-08-10 DIAGNOSIS — M797 Fibromyalgia: Secondary | ICD-10-CM | POA: Diagnosis not present

## 2015-08-10 DIAGNOSIS — M5136 Other intervertebral disc degeneration, lumbar region: Secondary | ICD-10-CM

## 2015-08-10 DIAGNOSIS — M329 Systemic lupus erythematosus, unspecified: Secondary | ICD-10-CM | POA: Insufficient documentation

## 2015-08-10 DIAGNOSIS — M533 Sacrococcygeal disorders, not elsewhere classified: Secondary | ICD-10-CM | POA: Insufficient documentation

## 2015-08-10 DIAGNOSIS — R7309 Other abnormal glucose: Secondary | ICD-10-CM | POA: Insufficient documentation

## 2015-08-10 DIAGNOSIS — G8929 Other chronic pain: Secondary | ICD-10-CM

## 2015-08-10 DIAGNOSIS — M546 Pain in thoracic spine: Secondary | ICD-10-CM | POA: Diagnosis present

## 2015-08-10 DIAGNOSIS — M47816 Spondylosis without myelopathy or radiculopathy, lumbar region: Secondary | ICD-10-CM

## 2015-08-10 MED ORDER — HYDROCODONE-ACETAMINOPHEN 10-325 MG PO TABS: ORAL_TABLET | ORAL | Status: DC

## 2015-08-10 NOTE — Progress Notes (Signed)
Subjective:    Patient ID: Joshua Acevedo, male    DOB: [birth year], 54 y.o.   MRN: [id number]  HPI Patient 54 year old gentleman returns to [facility] for further evaluation and treatment of pain involving the neck and entire back upper and lower extremity regions. On today's visit we discussed patient's condition including patient's multiple arthralgias and myalgias. We also discussed patient's condition with D Chrissmon at the present time we will proceed with interventional treatment at time return appointment as well as prescribed patient's medications. We discussed patient undergoing evaluation at a tertiary pain clinic and follow-up rheumatological evaluations. After resolution to discussion of patient's condition and decision was made to proceed interventional treatment at time return appointment and to consider modifying medications and other aspects of treatment regimen pending response to treatment. The patient was also provided prescription hydrocodone acetaminophen 10/325 limit 6 per day for quantity of 180. Reversibly had been prescribed medications by D Chrissmon and had requested an increase of medications. We explained to patient on today's visit multiple times. There was need for patient to be evaluated by rheumatologist as well as to be considered for additional evaluations and additional treatments if patient was to have successful treatment of his condition. After rather than to discussion of patient's condition the patient was with understanding and wished to proceed with recommended treatment regimen. We'll advise patient that we could have patient evaluated by tertiary pain clinic at any time if such was felt to be necessary. All were with understanding and in agreement status treatment plan   Review of Systems     Objective:   Physical Exam  There was tends to palpation of the splenius capitis Cipro talus musculature region of mild to moderate degree. Patient  appeared to be with unremarkable Spurling's maneuver with grip strength appeared to be equal bilaterally. There was tenderness over the region of the acromioclavicular glenohumeral joint regions with tenderness over the thoracic facet thoracic paraspinal muscles region a moderate degree as well there. Be unremarkable Spurling's maneuver and Tinel and Phalen's maneuver were without significant increase of pain. Palpation over the region of the lumbar paraspinal muscles lumbar facet region associated with moderate discomfort with lateral bending and rotation extension and palpation of the lumbar facets reproducing moderate discomfort. There was moderate to moderately severe tenderness to palpation over the PSIS PSIS region as well as the gluteal and piriformis musculature region was prepped raising tolerates approximately 20 without a definite increased pain with dorsiflexion noted. There appeared to be negative clonus negative Homans. He was tenderness on the greater trochanteric region iliotibial band region. No definite sensory deficit of dermatomal distribution detected. There was negative clonus negative Homans. Palpation of the gluteal and piriformis musculature region reproduced moderate discomfort with moderate to moderately severe tenderness to palpation on the left as well as on the right. Abdomen is nontender with no costovertebral angle tenderness noted.      Assessment & Plan:   Sacroiliac joint dysfunction Lumbar facet syndrome Systemic lupus erythematosus Raynaud's syndrome Fibromyalgia Osteoarthritis Hepatitis C Borderline diabetes    PLAN   Continue present medication hydrocodone acetaminophen  Block of nerves to sacroiliac joint to be performed at time return appointment  F/U PCP D Chrissmon  for evaliation of  BP and general medical  condition  F/U surgical evaluation. May consider pending follow-up evaluations  F/U neurological evaluation. May consider pending follow-up  evaluations  F/U rheumatological evaluation with Dr. Mayer Masker at [facility] as discussed  May consider radiofrequency rhizolysis  or intraspinal procedures pending response to present treatment and F/U evaluation   Patient to call Pain Management Center should patient have concerns prior to scheduled return appointment.

## 2015-08-10 NOTE — Patient Instructions (Addendum)
PLAN   Continue present medication   Block of nerves to the sacroiliac joint to be performed at time of return appointment   F/U PCP D C Chrissmon for evaliation of  BP and general medical  Condition  Recommend evaluation at [facility] to pain clinic to recommend treatment regimen as discussed. Once you have been evaluated at [facility] pain clinic we will follow recommendations of the tertiary pain clinic and consider prescribing medications as well as considering interventional treatment for treatment of your pain  F/U surgical evaluation. May consider pending follow-up evaluations  F/U neurological evaluation. May consider pending follow-up evaluations  May consider radiofrequency rhizolysis or intraspinal procedures pending response to present treatment and F/U evaluation   Patient to call Pain Management Center should patient have concerns prior to scheduled return appointment.

## 2015-08-10 NOTE — Telephone Encounter (Signed)
Refill HYDROcodone-acetaminophen (NORCO) 10-325 MG tablet  Is totally out.   Pt is coming in at 1:30 but needs pain meds before.  Thanks Con Memos

## 2015-08-10 NOTE — Telephone Encounter (Signed)
Patient is being seen in the office now. 

## 2015-08-10 NOTE — Progress Notes (Signed)
Subjective:    Patient ID: Joshua Acevedo, male    DOB: 1961-06-06, 54 y.o.   MRN: [id number] Chief Complaint  Patient presents with  . Pain    HPI  This 54 year old male with history of hepatitis C treated with interferon and ribiviron haschronic generalized pain in arms and legs with Raynaud's Phenomena. He has started evaluation with Dr. Primus Bravo at the [facility]. States he no longer uses marijuana or alcohol (states drug tests at the Pain Clinic were negative) but still having severe pain in the mornings that limits his ability to work until he take Hydrocodone/APAP. States Dr. Primus Bravo was going to do spinal injections today but he doesn't want to have that done. 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 States he cannot be out of work due to financial difficulties and wife can't work due to her health.    Patient Active Problem List   Diagnosis Date Noted  . Facet syndrome, lumbar 08/10/2015  . Fibromyalgia 06/25/2015  . Primary Raynaud's phenomenon 06/25/2015  . Arthralgia of multiple joints 03/16/2015  . Chronic pain associated with significant psychosocial dysfunction 03/16/2015  . Family history of arthritis 03/16/2015  . Fatigue 03/16/2015  . Acid reflux 03/16/2015  . Gout 03/16/2015  . Hypercholesteremia 03/16/2015  . Benign hypertension 03/16/2015  . Borderline diabetes 03/16/2015  . Decreased libido 03/16/2015  . 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 Cannabis abuse 03/16/2015  . Changeable mood ([place]) 03/16/2015  . Cramp of limb 03/16/2015  . Acquired trigger finger 03/16/2015  . Raynaud's syndrome 03/16/2015  . Avitaminosis D 03/16/2015  . Paroxysmal digital cyanosis 01/08/2014  . Breathlessness on exertion 12/15/2009  . Muscle ache 07/14/2009  . Bone marrow failure ([place]) 07/08/2009  . LBP (low back pain) 05/15/2009  . Hepatitis C virus infection without hepatic coma 06/02/2008  . Malaise and fatigue 05/01/2008   Past Surgical  History  Procedure Laterality Date  . No past surgeries     Family History  Problem Relation Age of Onset  . Diabetes Paternal Grandfather   . Heart attack Maternal Grandmother   . Heart attack Maternal Grandfather   . Hypertension Mother   . Cataracts Mother   . Diabetes Father   . Coronary artery disease Father    Social History  Substance Use Topics  . Smoking status: Current Every Day Smoker -- 1.00 packs/day for 40 years    Types: Cigarettes  . Smokeless tobacco: Never Used  . Alcohol Use: No     Comment: formerly was a heavy user   No Known Allergies Current Outpatient Prescriptions on File Prior to Visit  Medication Sig Dispense Refill  . ALPRAZolam (XANAX) 0.5 MG tablet TAKE 1 TABLET THREE TIMES DAILY AS NEEDED 90 tablet 0  . atorvastatin (LIPITOR) 40 MG tablet Take 1 tablet (40 mg total) by mouth daily. 30 tablet 3  . cholecalciferol (VITAMIN D) 1000 UNITS tablet Take 2 tablets by mouth daily.    . cyclobenzaprine (FLEXERIL) 10 MG tablet TAKE 1 TABLET TWICE DAILY AS NEEDED 60 tablet 1  . fluticasone (FLONASE) 50 MCG/ACT nasal spray Place 2 sprays into both nostrils daily.    Marland Kitchen HYDROcodone-acetaminophen (NORCO) 10-325 MG tablet Limit 3-6 tablets by mouth per day if tolerated 180 tablet 0  . Na Sulfate-K Sulfate-Mg Sulf (SUPREP BOWEL PREP) SOLN Take 1 kit by mouth as directed. (Patient not taking: Reported on 07/28/2015) 1 Bottle 0  . Omeprazole 20  MG TBEC Take 2 tablets by mouth daily.     No current facility-administered medications on file prior to visit.    Review of Systems  HENT: Negative.   Eyes: Negative.   Respiratory: Negative.   Cardiovascular: Negative.   Gastrointestinal: Negative.   Genitourinary: Negative.   Musculoskeletal: Positive for myalgias and arthralgias.       Rare episodes of Raynaud's Phenomena. Still having soreness and pains in calves nearly all day. Controlled by Hydrocodone/APAP.  Neurological: Negative.   Psychiatric/Behavioral:  Positive for agitation.  BP 158/88 mmHg  Pulse 84  Temp(Src) 97.7 F (36.5 C) (Oral)  Resp 16  Wt 155 lb (70.308 kg)  SpO2 98%     Objective:   Physical Exam  Constitutional: He appears well-developed and well-nourished.  Musculoskeletal:  Tenderness in both calves and forearms at the present. No joint swelling or sign of Raynaud's today. Stiff gait initially upon standing but less as he walks some.  Psychiatric: His mood appears anxious. He is agitated.      Assessment & Plan:  1. Chronic pain Counseled regarding need to stay on the program as outlined by Dr. Primus Bravo and proceed with injection therapy. Discussed situation with Dr. Primus Bravo and he stated he would be glad to see the patient today to proceed with injections. He also suggested he may refer the patient to a tertiary pain clinic at [facility] or [facility] to confirm diagnoses of fibromyalgia, Raynaud's, gout and possible lupus. Patient agreed to go back to Dr. Ethel Rana office now.

## 2015-08-14 ENCOUNTER — Ambulatory Visit: Payer: 59

## 2015-08-24 ENCOUNTER — Other Ambulatory Visit: Payer: Self-pay | Admitting: Family Medicine

## 2015-08-24 ENCOUNTER — Encounter: Payer: Self-pay | Admitting: *Deleted

## 2015-08-24 NOTE — Telephone Encounter (Signed)
Cholesterol medication was changed to Lipitor 40 mg qd on 05-01-15 due to high cholesterol levels. Pain clinic knows statins like Lipitor can have muscle and joint pains as a side effect. They may be lowering the dose to try to help diminish pain.

## 2015-08-24 NOTE — Telephone Encounter (Signed)
Medication list in chart states Lipitor is 40 mg. Please advise.

## 2015-08-25 ENCOUNTER — Ambulatory Visit: Payer: 59 | Admitting: Pain Medicine

## 2015-08-25 NOTE — Telephone Encounter (Signed)
Pt's wife called to see if the Xanax had been called into the pharmacy. Thanks TNP

## 2015-08-25 NOTE — Telephone Encounter (Signed)
Patient's wife Anjanette advised as directed below. Wife verbalized understanding.

## 2015-08-26 ENCOUNTER — Telehealth: Payer: Self-pay | Admitting: Pain Medicine

## 2015-08-26 NOTE — Telephone Encounter (Signed)
Joshua Acevedo is out of Zanax and dr Jeananne Rama will not write anymore / wants dr crisp to take over this med

## 2015-08-27 NOTE — Discharge Instructions (Signed)

## 2015-08-28 ENCOUNTER — Encounter
Admission: RE | Disposition: A | Discharge status: Home / Self Care | Payer: Self-pay | Source: Ambulatory Visit | Attending: Gastroenterology

## 2015-08-28 ENCOUNTER — Other Ambulatory Visit: Payer: Self-pay | Admitting: Family Medicine

## 2015-08-28 ENCOUNTER — Other Ambulatory Visit: Payer: Self-pay | Admitting: Gastroenterology

## 2015-08-28 ENCOUNTER — Ambulatory Visit: Payer: 59 | Admitting: Anesthesiology

## 2015-08-28 ENCOUNTER — Ambulatory Visit
Admission: RE | Admit: 2015-08-28 | Discharge: 2015-08-28 | Disposition: A | Discharge status: Home / Self Care | Payer: 59 | Source: Ambulatory Visit | Attending: Gastroenterology | Admitting: Gastroenterology

## 2015-08-28 DIAGNOSIS — I10 Essential (primary) hypertension: Secondary | ICD-10-CM | POA: Diagnosis not present

## 2015-08-28 DIAGNOSIS — Z8489 Family history of other specified conditions: Secondary | ICD-10-CM | POA: Diagnosis not present

## 2015-08-28 DIAGNOSIS — Z8619 Personal history of other infectious and parasitic diseases: Secondary | ICD-10-CM | POA: Insufficient documentation

## 2015-08-28 DIAGNOSIS — Z79899 Other long term (current) drug therapy: Secondary | ICD-10-CM | POA: Diagnosis not present

## 2015-08-28 DIAGNOSIS — M1990 Unspecified osteoarthritis, unspecified site: Secondary | ICD-10-CM | POA: Diagnosis not present

## 2015-08-28 DIAGNOSIS — Z1211 Encounter for screening for malignant neoplasm of colon: Secondary | ICD-10-CM | POA: Insufficient documentation

## 2015-08-28 DIAGNOSIS — D124 Benign neoplasm of descending colon: Secondary | ICD-10-CM | POA: Insufficient documentation

## 2015-08-28 DIAGNOSIS — K219 Gastro-esophageal reflux disease without esophagitis: Secondary | ICD-10-CM | POA: Diagnosis not present

## 2015-08-28 DIAGNOSIS — D125 Benign neoplasm of sigmoid colon: Secondary | ICD-10-CM | POA: Insufficient documentation

## 2015-08-28 DIAGNOSIS — Z7951 Long term (current) use of inhaled steroids: Secondary | ICD-10-CM | POA: Diagnosis not present

## 2015-08-28 DIAGNOSIS — Z8249 Family history of ischemic heart disease and other diseases of the circulatory system: Secondary | ICD-10-CM | POA: Insufficient documentation

## 2015-08-28 DIAGNOSIS — E785 Hyperlipidemia, unspecified: Secondary | ICD-10-CM | POA: Diagnosis not present

## 2015-08-28 DIAGNOSIS — Z87891 Personal history of nicotine dependence: Secondary | ICD-10-CM | POA: Insufficient documentation

## 2015-08-28 DIAGNOSIS — K641 Second degree hemorrhoids: Secondary | ICD-10-CM | POA: Insufficient documentation

## 2015-08-28 DIAGNOSIS — Z833 Family history of diabetes mellitus: Secondary | ICD-10-CM | POA: Diagnosis not present

## 2015-08-28 HISTORY — DX: Reserved for inherently not codable concepts without codable children: IMO0001

## 2015-08-28 HISTORY — DX: Presence of dental prosthetic device (complete) (partial): Z97.2

## 2015-08-28 HISTORY — PX: POLYPECTOMY: SHX5525

## 2015-08-28 HISTORY — DX: Unspecified osteoarthritis, unspecified site: M19.90

## 2015-08-28 HISTORY — DX: Inflammatory liver disease, unspecified: K75.9

## 2015-08-28 HISTORY — PX: COLONOSCOPY WITH PROPOFOL: SHX5780

## 2015-08-28 SURGERY — COLONOSCOPY WITH PROPOFOL
Anesthesia: Monitor Anesthesia Care | Wound class: Contaminated

## 2015-08-28 MED ORDER — HYDROMORPHONE HCL 1 MG/ML IJ SOLN: 0.2500 mg | INTRAMUSCULAR | Status: DC | PRN

## 2015-08-28 MED ORDER — PROMETHAZINE HCL 25 MG/ML IJ SOLN: 6.2500 mg | INTRAMUSCULAR | Status: DC | PRN

## 2015-08-28 MED ORDER — OXYCODONE HCL 5 MG/5ML PO SOLN: 5.0000 mg | Freq: Once | ORAL | Status: DC | PRN

## 2015-08-28 MED ORDER — LACTATED RINGERS IV SOLN
INTRAVENOUS | Status: DC
  Administered 2015-08-28: 11:00:00 via INTRAVENOUS

## 2015-08-28 MED ORDER — LIDOCAINE HCL (CARDIAC) 20 MG/ML IV SOLN
INTRAVENOUS | Status: DC | PRN
  Administered 2015-08-28: 50 mg via INTRAVENOUS

## 2015-08-28 MED ORDER — PROPOFOL 10 MG/ML IV BOLUS
INTRAVENOUS | Status: DC | PRN
  Administered 2015-08-28: 20 mg via INTRAVENOUS
  Administered 2015-08-28: 70 mg via INTRAVENOUS
  Administered 2015-08-28: 30 mg via INTRAVENOUS
  Administered 2015-08-28: 20 mg via INTRAVENOUS
  Administered 2015-08-28 (×2): 30 mg via INTRAVENOUS
  Administered 2015-08-28: 20 mg via INTRAVENOUS

## 2015-08-28 MED ORDER — OXYCODONE HCL 5 MG PO TABS: 5.0000 mg | ORAL_TABLET | Freq: Once | ORAL | Status: DC | PRN

## 2015-08-28 MED ORDER — STERILE WATER FOR IRRIGATION IR SOLN
Status: DC | PRN
  Administered 2015-08-28: 12:00:00

## 2015-08-28 MED ORDER — MEPERIDINE HCL 25 MG/ML IJ SOLN: 6.2500 mg | INTRAMUSCULAR | Status: DC | PRN

## 2015-08-28 SURGICAL SUPPLY — 28 items
CANISTER SUCT 1200ML W/VALVE (MISCELLANEOUS) ×4 IMPLANT
FCP ESCP3.2XJMB 240X2.8X (MISCELLANEOUS)
FORCEPS BIOP RAD 4 LRG CAP 4 (CUTTING FORCEPS) ×4 IMPLANT
FORCEPS BIOP RJ4 240 W/NDL (MISCELLANEOUS)
FORCEPS ESCP3.2XJMB 240X2.8X (MISCELLANEOUS) IMPLANT
GOWN CVR UNV OPN BCK APRN NK (MISCELLANEOUS) ×4 IMPLANT
GOWN ISOL THUMB LOOP REG UNIV (MISCELLANEOUS) ×4
HEMOCLIP INSTINCT (CLIP) IMPLANT
INJECTOR VARIJECT VIN23 (MISCELLANEOUS) IMPLANT
KIT CO2 TUBING (TUBING) IMPLANT
KIT DEFENDO VALVE AND CONN (KITS) IMPLANT
KIT ENDO PROCEDURE OLY (KITS) ×4 IMPLANT
LIGATOR MULTIBAND 6SHOOTER MBL (MISCELLANEOUS) IMPLANT
MARKER SPOT ENDO TATTOO 5ML (MISCELLANEOUS) IMPLANT
PAD GROUND ADULT SPLIT (MISCELLANEOUS) IMPLANT
SNARE SHORT THROW 13M SML OVAL (MISCELLANEOUS) IMPLANT
SNARE SHORT THROW 30M LRG OVAL (MISCELLANEOUS) IMPLANT
SPOT EX ENDOSCOPIC TATTOO (MISCELLANEOUS)
SUCTION POLY TRAP 4CHAMBER (MISCELLANEOUS) IMPLANT
TRAP SUCTION POLY (MISCELLANEOUS) IMPLANT
TUBING CONN 6MMX3.1M (TUBING)
TUBING SUCTION CONN 0.25 STRL (TUBING) IMPLANT
UNDERPAD 30X60 958B10 (PK) (MISCELLANEOUS) IMPLANT
VALVE BIOPSY ENDO (VALVE) IMPLANT
VARIJECT INJECTOR VIN23 (MISCELLANEOUS)
WATER AUXILLARY (MISCELLANEOUS) IMPLANT
WATER STERILE IRR 250ML POUR (IV SOLUTION) ×4 IMPLANT
WATER STERILE IRR 500ML POUR (IV SOLUTION) IMPLANT

## 2015-08-28 NOTE — H&P (Signed)
[facility]  [address] [place], [place] [address] Phone: [phone] Fax : [phone]  Primary Care Physician:  Vernie Murders, [place] Primary Gastroenterologist:  Dr. Allen Norris  Pre-Procedure History & Physical: HPI:  Joshua Acevedo is a 54 y.o. male is here for a screening colonoscopy.   Past Medical History  Diagnosis Date  . Hypertension   . Hyperlipidemia   . GERD (gastroesophageal reflux disease)   . Arthritis     "everywhere"  . Shortness of breath dyspnea   . Hepatitis 2009    "c" - treated  . Wears dentures     partial upper    Past Surgical History  Procedure Laterality Date  . No past surgeries      Prior to Admission medications   Medication Sig Start Date End Date Taking? Authorizing Provider  ALPRAZolam Duanne Moron) 0.5 MG tablet TAKE 1 TABLET THREE TIMES DAILY AS NEEDED 07/24/15  Yes Birdie Sons, MD  atorvastatin (LIPITOR) 40 MG tablet Take 1 tablet (40 mg total) by mouth daily. 05/01/15  Yes Dennis E Chrismon, PA  cholecalciferol (VITAMIN D) 1000 UNITS tablet Take 2 tablets by mouth daily.   Yes Historical Provider, MD  cyclobenzaprine (FLEXERIL) 10 MG tablet TAKE 1 TABLET TWICE DAILY AS NEEDED 08/04/15  Yes Dennis E Chrismon, PA  fluticasone (FLONASE) 50 MCG/ACT nasal spray Place 2 sprays into both nostrils daily.   Yes Historical Provider, MD  HYDROcodone-acetaminophen [facility]) 10-325 MG tablet Limit 3-6 tablets by mouth per day if tolerated 08/10/15  Yes Mohammed Kindle, MD  Na Sulfate-K Sulfate-Mg Sulf (SUPREP BOWEL PREP) SOLN Take 1 kit by mouth as directed. 07/23/15  Yes Lucilla Lame, MD  NIFEDIPINE PO Take 20 mg by mouth daily. AM   Yes Historical Provider, MD  Omeprazole 20 MG TBEC Take 2 tablets by mouth daily.   Yes Historical Provider, MD    Allergies as of 07/23/2015  . (No Known Allergies)    Family History  Problem Relation Age of Onset  . Diabetes Paternal Grandfather   . Heart attack Maternal Grandmother   . Heart attack  Maternal Grandfather   . Hypertension Mother   . Cataracts Mother   . Diabetes Father   . Coronary artery disease Father     Social History   Social History  . Marital Status: Married    Spouse Name: N/A  . Number of Children: N/A  . Years of Education: N/A   Occupational History  . Not on file.   Social History Main Topics  . Smoking status: Former Smoker -- 1.00 packs/day for 40 years    Types: Cigarettes    Quit date: 08/19/2015  . Smokeless tobacco: Never Used  . Alcohol Use: No     Comment: formerly was a heavy user  . Drug Use: No     Comment: previously used marijuana, quit in 2009 when diagnosed with Hepatitis C  . Sexual Activity: Not on file   Other Topics Concern  . Not on file   Social History Narrative    Review of Systems: See HPI, otherwise negative ROS  Physical Exam: BP 155/75 mmHg  Pulse 72  Temp(Src) 97.9 F (36.6 C) (Temporal)  Resp 16  Ht _0  (1.676 m)  Wt 144 lb (65.318 kg)  BMI 23.25 kg/m2  SpO2 100% General:   Alert,  pleasant and cooperative in NAD Head:  Normocephalic and atraumatic. Neck:  Supple; no masses or thyromegaly. Lungs:  Clear throughout to auscultation.  Heart:  Regular rate and rhythm. Abdomen:  Soft, nontender and nondistended. Normal bowel sounds, without guarding, and without rebound.   Neurologic:  Alert and  oriented x4;  grossly normal neurologically.  Impression/Plan: Joshua Acevedo is now here to undergo a screening colonoscopy.  Risks, benefits, and alternatives regarding colonoscopy have been reviewed with the patient.  Questions have been answered.  All parties agreeable.

## 2015-08-28 NOTE — Telephone Encounter (Signed)
Pt's wife is requesting a refill for pt's ALPRAZolam Duanne Moron) 0.5 MG tablet be sent to [facility]. Wife stated that she contacted the pain clinic that pt goes to and was told they do not prescribe  ALPRAZolam (XANAX) 0.5 MG tablet that she would have to contact our office for this medication. Thanks TNP

## 2015-08-28 NOTE — Transfer of Care (Signed)
Immediate Anesthesia Transfer of Care Note  Patient: Joshua Acevedo  Procedure(s) Performed: Procedure(s): COLONOSCOPY WITH PROPOFOL (N/A) POLYPECTOMY  Patient Location: PACU  Anesthesia Type: MAC  Level of Consciousness: awake, alert  and patient cooperative  Airway and Oxygen Therapy: Patient Spontanous Breathing and Patient connected to supplemental oxygen  Post-op Assessment: Post-op Vital signs reviewed, Patient's Cardiovascular Status Stable, Respiratory Function Stable, Patent Airway and No signs of Nausea or vomiting  Post-op Vital Signs: Reviewed and stable  Complications: No apparent anesthesia complications

## 2015-08-28 NOTE — Anesthesia Procedure Notes (Signed)
Procedure Name: MAC Performed by: Raphael Espe Pre-anesthesia Checklist: Patient identified, Emergency Drugs available, Suction available, Timeout performed and Patient being monitored Patient Re-evaluated:Patient Re-evaluated prior to inductionOxygen Delivery Method: Nasal cannula Placement Confirmation: positive ETCO2       

## 2015-08-28 NOTE — Op Note (Signed)
[facility] Patient Name: Joshua Acevedo Procedure Date: 08/28/2015 12:25 PM MRN: [id number] Account #: [id number] Date of Birth: Dec 14, 1960 Admit Type: Outpatient Age: 54 Room: [facility] OR ROOM 01 Gender: Male Note Status: Finalized Procedure:         Colonoscopy Indications:       Screening for colorectal malignant neoplasm Providers:         Lucilla Lame, MD Referring MD:      Vickki Muff. Chrismon, MD (Referring MD) Medicines:         Propofol per Anesthesia Complications:     No immediate complications. Procedure:         Pre-Anesthesia Assessment:                    - Prior to the procedure, a History and Physical was                     performed, and patient medications and allergies were                     reviewed. The patient's tolerance of previous anesthesia                     was also reviewed. The risks and benefits of the procedure                     and the sedation options and risks were discussed with the                     patient. All questions were answered, and informed consent                     was obtained. Prior Anticoagulants: The patient has taken                     no previous anticoagulant or antiplatelet agents. ASA                     Grade Assessment: II - A patient with mild systemic                     disease. After reviewing the risks and benefits, the                     patient was deemed in satisfactory condition to undergo                     the procedure.                    After obtaining informed consent, the colonoscope was                     passed under direct vision. Throughout the procedure, the                     patient's blood pressure, pulse, and oxygen saturations                     were monitored continuously. The Olympus CF-HQ190L                     Colonoscope (S#. [id number]) was introduced through the anus  and advanced to the the cecum, identified by appendiceal                      orifice and ileocecal valve. The colonoscopy was performed                     without difficulty. The patient tolerated the procedure                     well. The quality of the bowel preparation was excellent. Findings:      The perianal and digital rectal examinations were normal.      A 4 mm polyp was found in the descending colon. The polyp was sessile.       The polyp was removed with a cold biopsy forceps. Resection and       retrieval were complete.      Two sessile polyps were found in the sigmoid colon. The polyps were 2 to       3 mm in size. These polyps were removed with a cold biopsy forceps.       Resection and retrieval were complete.      Non-bleeding internal hemorrhoids were found during retroflexion. The       hemorrhoids were Grade II (internal hemorrhoids that prolapse but reduce       spontaneously). Impression:        - One 4 mm polyp in the descending colon. Resected and                     retrieved.                    - Two 2 to 3 mm polyps in the sigmoid colon. Resected and                     retrieved.                    - Non-bleeding internal hemorrhoids. Recommendation:    - Await pathology results.                    - Repeat colonoscopy in 5 years if polyp adenoma and 10                     years if hyperplastic Procedure Code(s): --- Professional ---                    [phone], Colonoscopy, flexible; with biopsy, single or                     multiple Diagnosis Code(s): --- Professional ---                    Z12.11, Encounter for screening for malignant neoplasm of                     colon                    D12.4, Benign neoplasm of descending colon CPT copyright 2014 American Medical Association. All rights reserved. The codes documented in this report are preliminary and upon coder review may  be revised to meet current compliance requirements. Lucilla Lame, MD 08/28/2015 12:50:32 PM This report has been signed  electronically. Number of Addenda: 0 Note Initiated On: 08/28/2015 12:25 PM Scope Withdrawal  Time: 0 hours 8 minutes 2 seconds  Total Procedure Duration: 0 hours 11 minutes 9 seconds       [facility]

## 2015-08-28 NOTE — Anesthesia Postprocedure Evaluation (Signed)
  Anesthesia Post-op Note  Patient: Joshua Acevedo  Procedure(s) Performed: Procedure(s): COLONOSCOPY WITH PROPOFOL (N/A) POLYPECTOMY  Anesthesia type:MAC  Patient location: PACU  Post pain: Pain level controlled  Post assessment: Post-op Vital signs reviewed, Patient's Cardiovascular Status Stable, Respiratory Function Stable, Patent Airway and No signs of Nausea or vomiting  Post vital signs: Reviewed and stable  Last Vitals:  Filed Vitals:   08/28/15 1300  BP: 148/95  Pulse: 70  Temp:   Resp: 17    Level of consciousness: awake, alert  and patient cooperative  Complications: No apparent anesthesia complications

## 2015-08-28 NOTE — Anesthesia Preprocedure Evaluation (Addendum)
Anesthesia Evaluation  Patient identified by MRN, date of birth, ID band Patient awake    Reviewed: Allergy & Precautions, NPO status , Patient's Chart, lab work & pertinent test results, reviewed documented beta blocker date and time   Airway Mallampati: I  TM Distance: >3 FB Neck ROM: Full    Dental  (+) Partial Upper   Pulmonary former smoker,    Pulmonary exam normal        Cardiovascular hypertension, Normal cardiovascular exam     Neuro/Psych negative neurological ROS  negative psych ROS   GI/Hepatic GERD  Medicated and Controlled,(+) Hepatitis -, C  Endo/Other  negative endocrine ROS  Renal/GU negative Renal ROS  negative genitourinary   Musculoskeletal  (+) Arthritis , Osteoarthritis,  Fibromyalgia -  Abdominal   Peds  Hematology negative hematology ROS (+)   Anesthesia Other Findings   Reproductive/Obstetrics                            Anesthesia Physical Anesthesia Plan  ASA: II  Anesthesia Plan: MAC   Post-op Pain Management:    Induction: Intravenous  Airway Management Planned:   Additional Equipment:   Intra-op Plan:   Post-operative Plan:   Informed Consent: I have reviewed the patients History and Physical, chart, labs and discussed the procedure including the risks, benefits and alternatives for the proposed anesthesia with the patient or authorized representative who has indicated his/her understanding and acceptance.     Plan Discussed with: CRNA  Anesthesia Plan Comments:         Anesthesia Quick Evaluation

## 2015-08-31 ENCOUNTER — Encounter: Payer: Self-pay | Admitting: Gastroenterology

## 2015-08-31 MED ORDER — ALPRAZOLAM 0.5 MG PO TABS: 0.5000 mg | ORAL_TABLET | Freq: Three times a day (TID) | ORAL | Status: DC | PRN

## 2015-08-31 NOTE — Telephone Encounter (Signed)
Pt is calling to see if Rx is ready.  CB#[phone]/MW

## 2015-08-31 NOTE — Telephone Encounter (Signed)
RX can be called in at pharmacy.

## 2015-08-31 NOTE — Telephone Encounter (Signed)
RX called in at [facility]. Left patient a voicemail advising him that the RX has been called in at pharmacy.

## 2015-09-01 ENCOUNTER — Encounter: Payer: Self-pay | Admitting: Gastroenterology

## 2015-09-02 ENCOUNTER — Telehealth: Payer: Self-pay | Admitting: Pain Medicine

## 2015-09-02 NOTE — Telephone Encounter (Signed)
Routed to [facility] for input.

## 2015-09-02 NOTE — Telephone Encounter (Signed)
Patient's insurance has refused to pay for labs / they told her an appeal can be sent to [employer] as an appeal for payment / in the future if labs could be sent to [facility] they are accepted by ins. / please call patient when letter of appeal is sent.

## 2015-09-07 ENCOUNTER — Telehealth: Payer: Self-pay | Admitting: Pain Medicine

## 2015-09-07 NOTE — Telephone Encounter (Signed)
Out of meds on 09-08-15 has appt for procedure on 09-14-15 / wants refill please

## 2015-09-07 NOTE — Telephone Encounter (Signed)
Joshua Acevedo and [name] Schedule patient for eval for Tuesday, 09/08/2015 at 7 AM Angie, Has insurance approval this patient for his procedure? Please ask Angie if insurance has approved patient for procedure and have patient come for evaluation tomorrow for evaluation

## 2015-09-07 NOTE — Telephone Encounter (Signed)
According to our records, meds are TLU 09-08-15. What do you want to do?

## 2015-09-08 ENCOUNTER — Telehealth: Payer: Self-pay

## 2015-09-08 ENCOUNTER — Ambulatory Visit: Payer: 59 | Attending: Pain Medicine | Admitting: Pain Medicine

## 2015-09-08 ENCOUNTER — Other Ambulatory Visit: Payer: Self-pay | Admitting: Pain Medicine

## 2015-09-08 ENCOUNTER — Encounter: Payer: Self-pay | Admitting: Pain Medicine

## 2015-09-08 VITALS — BP 128/66 | HR 78 | Temp 98.6°F | Resp 15 | Ht 66.0 in | Wt 152.0 lb

## 2015-09-08 DIAGNOSIS — M47816 Spondylosis without myelopathy or radiculopathy, lumbar region: Secondary | ICD-10-CM

## 2015-09-08 DIAGNOSIS — I7300 Raynaud's syndrome without gangrene: Secondary | ICD-10-CM | POA: Insufficient documentation

## 2015-09-08 DIAGNOSIS — B1920 Unspecified viral hepatitis C without hepatic coma: Secondary | ICD-10-CM | POA: Insufficient documentation

## 2015-09-08 DIAGNOSIS — M533 Sacrococcygeal disorders, not elsewhere classified: Secondary | ICD-10-CM | POA: Diagnosis not present

## 2015-09-08 DIAGNOSIS — M797 Fibromyalgia: Secondary | ICD-10-CM | POA: Diagnosis not present

## 2015-09-08 DIAGNOSIS — M329 Systemic lupus erythematosus, unspecified: Secondary | ICD-10-CM | POA: Diagnosis not present

## 2015-09-08 DIAGNOSIS — M549 Dorsalgia, unspecified: Secondary | ICD-10-CM | POA: Diagnosis present

## 2015-09-08 DIAGNOSIS — R7303 Prediabetes: Secondary | ICD-10-CM | POA: Diagnosis not present

## 2015-09-08 DIAGNOSIS — M1990 Unspecified osteoarthritis, unspecified site: Secondary | ICD-10-CM | POA: Insufficient documentation

## 2015-09-08 DIAGNOSIS — M542 Cervicalgia: Secondary | ICD-10-CM | POA: Diagnosis present

## 2015-09-08 DIAGNOSIS — M5136 Other intervertebral disc degeneration, lumbar region: Secondary | ICD-10-CM

## 2015-09-08 MED ORDER — HYDROCODONE-ACETAMINOPHEN 10-325 MG PO TABS: ORAL_TABLET | ORAL | Status: DC

## 2015-09-08 NOTE — Telephone Encounter (Signed)
Pt needs urine to be sent to [facility] he said his insurance does not cover well

## 2015-09-08 NOTE — Progress Notes (Signed)
   Subjective:    Patient ID: Joshua Acevedo, male    DOB: 1960-12-29, 54 y.o.   MRN: [id number]  HPI  Patient is a 54 year old gentleman who returns to pain management for further evaluation and treatment of pain involving the neck and back upper and lower extremity region with predominant pain involving the lower back lower extremity regions. Patient is pain is aggravated by standing walking twisting turning maneuvers. Patient states that the pain occurs across the buttocks and is aggravated by prolonged standing walking twisting turning maneuvers. Patient continues to work states that his pain is aggravated by certain movements throughout the day as well as standing walking. We discussed patient's condition and will proceed with block of the nerves to the sacroiliac joint at time return appointment in attempt to decrease severity of symptoms, minimize progression of symptoms, and avoid any more involved treatment. The patient agreed to suggested treatment plan.       Review of Systems     Objective:   Physical Exam  There was tenderness to palpation of the cervical facet cervical paraspinal musculature region of mild/moderate degree. There was unremarkable Spurling's maneuver. There was moderate tenderness to palpation of the acromioclavicular and glenohumeral joint regions. Patient appeared to be with bilaterally equal grip strength and Tinel and Phalen's maneuver without increased pain of significant degree. No crepitus of the thoracic region was noted. Palpation over the lumbar paraspinal muscles lumbar facet region associated with moderate discomfort. Lateral bending and rotation extension and palpation of the lumbar facets reproduced moderate discomfort. There was moderately severe tenderness to palpation of the PSIS and PII S regions. Patient was a positive Patrick's maneuver. Straight leg raising was tolerated to approximately 20 without increased pain with dorsiflexion noted. There was  negative clonus negative Homans. There was mild tenderness to palpation of the greater trochanteric region and iliotibial band region. No definite sensory deficit or dermatomal distribution of the lower extremities noted. Palpation over the PSIS and PII S regions reproduced predominant portion of patient's pain Abdomen was without excessive tenderness to palpation and no costovertebral tenderness was noted.            Assessment & Plan:    Sacroiliac joint dysfunction Lumbar facet syndrome Systemic lupus erythematosus Raynaud's syndrome Fibromyalgia Osteoarthritis Hepatitis C Borderline diabetes   PLAN    Continue present medication hydrocodone acetaminophen   Block of nerves to the sacroiliac joint to be performed at time of return appointment   F/U PCP D C Chrissmon for evaliation of  BP and general medical  condition  F/U surgical evaluation. May consider pending follow-up evaluations  F/U neurological evaluation. May consider pending follow-up evaluations  May consider radiofrequency rhizolysis or intraspinal procedures pending response to present treatment and F/U evaluation  Patient is to call l pain management prior to return appointment if you have concerns regarding condition

## 2015-09-08 NOTE — Progress Notes (Signed)
Safety precautions to be maintained throughout the outpatient stay will include: orient to surroundings, keep bed in low position, maintain call bell within reach at all times, provide assistance with transfer out of bed and ambulation.  

## 2015-09-08 NOTE — Patient Instructions (Addendum)
PLAN   Continue present medication hydrocodone acetaminophen   Block of nerves to the sacroiliac joint to be performed at time of return appointment   F/U PCP D C Chrissmon for evaliation of  BP and general medical  condition  F/U surgical evaluation. May consider pending follow-up evaluations  F/U neurological evaluation. May consider pending follow-up evaluations  May consider radiofrequency rhizolysis or intraspinal procedures pending response to present treatment and F/U evaluation   Call pain management prior to return appointment if you have concerns regarding conditionSacroiliac (SI) Joint Injection Patient Information  Description: The sacroiliac joint connects the scrum (very low back and tailbone) to the ilium (a pelvic bone which also forms half of the hip joint).  Normally this joint experiences very little motion.  When this joint becomes inflamed or unstable low back and or hip and pelvis pain may result.  Injection of this joint with local anesthetics (numbing medicines) and steroids can provide diagnostic information and reduce pain.  This injection is performed with the aid of x-ray guidance into the tailbone area while you are lying on your stomach.   You may experience an electrical sensation down the leg while this is being done.  You may also experience numbness.  We also may ask if we are reproducing your normal pain during the injection.  Conditions which may be treated SI injection:   Low back, buttock, hip or leg pain  Preparation for the Injection:  1. Do not eat any solid food or dairy products within 6 hours of your appointment.  2. You may drink clear liquids up to 2 hours before appointment.  Clear liquids include water, black coffee, juice or soda.  No milk or cream please. 3. You may take your regular medications, including pain medications with a sip of water before your appointment.  Diabetics should hold regular insulin (if take separately) and take  1/2 normal NPH dose the morning of the procedure.  Carry some sugar containing items with you to your appointment. 4. A driver must accompany you and be prepared to drive you home after your procedure. 5. Bring all of your current medications with you. 6. An IV may be inserted and sedation may be given at the discretion of the physician. 7. A blood pressure cuff, EKG and other monitors will often be applied during the procedure.  Some patients may need to have extra oxygen administered for a short period.  8. You will be asked to provide medical information, including your allergies, prior to the procedure.  We must know immediately if you are taking blood thinners (like Coumadin/Warfarin) or if you are allergic to IV iodine contrast (dye).  We must know if you could possible be pregnant.  Possible side effects:   Bleeding from needle site  Infection (rare, may require surgery)  Nerve injury (rare)  Numbness & tingling (temporary)  A brief convulsion or seizure  Light-headedness (temporary)  Pain at injection site (several days)  Decreased blood pressure (temporary)  Weakness in the leg (temporary)   Call if you experience:   New onset weakness or numbness of an extremity below the injection site that last more than 8 hours.  Hives or difficulty breathing ( go to the emergency room)  Inflammation or drainage at the injection site  Any new symptoms which are concerning to you  Please note:  Although the local anesthetic injected can often make your back/ hip/ buttock/ leg feel good for several hours after the injections, the pain  will likely return.  It takes 3-7 days for steroids to work in the sacroiliac area.  You may not notice any pain relief for at least that one week.  If effective, we will often do a series of three injections spaced 3-6 weeks apart to maximally decrease your pain.  After the initial series, we generally will wait some months before a repeat  injection of the same type.  If you have any questions, please call [phone] [facility]  Sacroiliac (SI) Joint Injection Patient Information  Description: The sacroiliac joint connects the scrum (very low back and tailbone) to the ilium (a pelvic bone which also forms half of the hip joint).  Normally this joint experiences very little motion.  When this joint becomes inflamed or unstable low back and or hip and pelvis pain may result.  Injection of this joint with local anesthetics (numbing medicines) and steroids can provide diagnostic information and reduce pain.  This injection is performed with the aid of x-ray guidance into the tailbone area while you are lying on your stomach.   You may experience an electrical sensation down the leg while this is being done.  You may also experience numbness.  We also may ask if we are reproducing your normal pain during the injection.  Conditions which may be treated SI injection:   Low back, buttock, hip or leg pain  Preparation for the Injection:  9. Do not eat any solid food or dairy products within 6 hours of your appointment.  10. You may drink clear liquids up to 2 hours before appointment.  Clear liquids include water, black coffee, juice or soda.  No milk or cream please. 58. You may take your regular medications, including pain medications with a sip of water before your appointment.  Diabetics should hold regular insulin (if take separately) and take 1/2 normal NPH dose the morning of the procedure.  Carry some sugar containing items with you to your appointment. 12. A driver must accompany you and be prepared to drive you home after your procedure. 75. Bring all of your current medications with you. 14. An IV may be inserted and sedation may be given at the discretion of the physician. 15. A blood pressure cuff, EKG and other monitors will often be applied during the procedure.  Some patients may  need to have extra oxygen administered for a short period.  23. You will be asked to provide medical information, including your allergies, prior to the procedure.  We must know immediately if you are taking blood thinners (like Coumadin/Warfarin) or if you are allergic to IV iodine contrast (dye).  We must know if you could possible be pregnant.  Possible side effects:   Bleeding from needle site  Infection (rare, may require surgery)  Nerve injury (rare)  Numbness & tingling (temporary)  A brief convulsion or seizure  Light-headedness (temporary)  Pain at injection site (several days)  Decreased blood pressure (temporary)  Weakness in the leg (temporary)   Call if you experience:   New onset weakness or numbness of an extremity below the injection site that last more than 8 hours.  Hives or difficulty breathing ( go to the emergency room)  Inflammation or drainage at the injection site  Any new symptoms which are concerning to you  Please note:  Although the local anesthetic injected can often make your back/ hip/ buttock/ leg feel good for several hours after the injections, the pain will likely  return.  It takes 3-7 days for steroids to work in the sacroiliac area.  You may not notice any pain relief for at least that one week.  If effective, we will often do a series of three injections spaced 3-6 weeks apart to maximally decrease your pain.  After the initial series, we generally will wait some months before a repeat injection of the same type.  If you have any questions, please call [phone] [place]  What are the risk, side effects and possible complications? Generally speaking, most procedures are safe.  However, with any procedure there are risks, side effects, and the possibility of complications.  The risks and complications are dependent upon the sites that are lesioned, or the type  of nerve block to be performed.  The closer the procedure is to the spine, the more serious the risks are.  Great care is taken when placing the radio frequency needles, block needles or lesioning probes, but sometimes complications can occur. 1. Infection: Any time there is an injection through the skin, there is a risk of infection.  This is why sterile conditions are used for these blocks.  There are four possible types of infection. 1. Localized skin infection. 2. Central Nervous System Infection-This can be in the form of Meningitis, which can be deadly. 3. Epidural Infections-This can be in the form of an epidural abscess, which can cause pressure inside of the spine, causing compression of the spinal cord with subsequent paralysis. This would require an emergency surgery to decompress, and there are no guarantees that the patient would recover from the paralysis. 4. Discitis-This is an infection of the intervertebral discs.  It occurs in about 1% of discography procedures.  It is difficult to treat and it may lead to surgery.        2. Pain: the needles have to go through skin and soft tissues, will cause soreness.       3. Damage to internal structures:  The nerves to be lesioned may be near blood vessels or    other nerves which can be potentially damaged.       4. Bleeding: Bleeding is more common if the patient is taking blood thinners such as  aspirin, Coumadin, Ticiid, Plavix, etc., or if he/she have some genetic predisposition  such as hemophilia. Bleeding into the spinal canal can cause compression of the spinal  cord with subsequent paralysis.  This would require an emergency surgery to  decompress and there are no guarantees that the patient would recover from the  paralysis.       5. Pneumothorax:  Puncturing of a lung is a possibility, every time a needle is introduced in  the area of the chest or upper back.  Pneumothorax refers to free air around the  collapsed lung(s), inside of the  thoracic cavity (chest cavity).  Another two possible  complications related to a similar event would include: Hemothorax and Chylothorax.   These are variations of the Pneumothorax, where instead of air around the collapsed  lung(s), you may have blood or chyle, respectively.       6. Spinal headaches: They may occur with any procedures in the area of the spine.       7. Persistent CSF (Cerebro-Spinal Fluid) leakage: This is a rare problem, but may occur  with prolonged intrathecal or epidural catheters either due to the formation of a fistulous  track or a dural  tear.       8. Nerve damage: By working so close to the spinal cord, there is always a possibility of  nerve damage, which could be as serious as a permanent spinal cord injury with  paralysis.       9. Death:  Although rare, severe deadly allergic reactions known as "Anaphylactic  reaction" can occur to any of the medications used.      10. Worsening of the symptoms:  We can always make thing worse.  What are the chances of something like this happening? Chances of any of this occuring are extremely low.  By statistics, you have more of a chance of getting killed in a motor vehicle accident: while driving to the hospital than any of the above occurring .  Nevertheless, you should be aware that they are possibilities.  In general, it is similar to taking a shower.  Everybody knows that you can slip, hit your head and get killed.  Does that mean that you should not shower again?  Nevertheless always keep in mind that statistics do not mean anything if you happen to be on the wrong side of them.  Even if a procedure has a 1 (one) in a 1,000,000 (million) chance of going wrong, it you happen to be that one..Also, keep in mind that by statistics, you have more of a chance of having something go wrong when taking medications.  Who should not have this procedure? If you are on a blood thinning medication (e.g. Coumadin, Plavix, see list of "Blood  Thinners"), or if you have an active infection going on, you should not have the procedure.  If you are taking any blood thinners, please inform your physician.  How should I prepare for this procedure?  Do not eat or drink anything at least six hours prior to the procedure.  Bring a driver with you .  It cannot be a taxi.  Come accompanied by an adult that can drive you back, and that is strong enough to help you if your legs get weak or numb from the local anesthetic.  Take all of your medicines the morning of the procedure with just enough water to swallow them.  If you have diabetes, make sure that you are scheduled to have your procedure done first thing in the morning, whenever possible.  If you have diabetes, take only half of your insulin dose and notify our nurse that you have done so as soon as you arrive at the clinic.  If you are diabetic, but only take blood sugar pills (oral hypoglycemic), then do not take them on the morning of your procedure.  You may take them after you have had the procedure.  Do not take aspirin or any aspirin-containing medications, at least eleven (11) days prior to the procedure.  They may prolong bleeding.  Wear loose fitting clothing that may be easy to take off and that you would not mind if it got stained with Betadine or blood.  Do not wear any jewelry or perfume  Remove any nail coloring.  It will interfere with some of our monitoring equipment.  NOTE: Remember that this is not meant to be interpreted as a complete list of all possible complications.  Unforeseen problems may occur.  BLOOD THINNERS The following drugs contain aspirin or other products, which can cause increased bleeding during surgery and should not be taken for 2 weeks prior to and 1 week after surgery.  If you should need  take something for relief of minor pain, you may take acetaminophen which is found in Tylenol,m Datril, Anacin-3 and Panadol. It is not blood thinner. The  products listed below are.  Do not take any of the products listed below in addition to any listed on your instruction sheet.  A.P.C or A.P.C with Codeine Codeine Phosphate Capsules #3 Ibuprofen Ridaura  ABC compound Congesprin Imuran rimadil  Advil Cope Indocin Robaxisal  Alka-Seltzer Effervescent Pain Reliever and Antacid Coricidin or Coricidin-D  Indomethacin Rufen  Alka-Seltzer plus Cold Medicine Cosprin Ketoprofen S-A-C Tablets  Anacin Analgesic Tablets or Capsules Coumadin Korlgesic Salflex  Anacin Extra Strength Analgesic tablets or capsules CP-2 Tablets Lanoril Salicylate  Anaprox Cuprimine Capsules Levenox Salocol  Anexsia-D Dalteparin Magan Salsalate  Anodynos Darvon compound Magnesium Salicylate Sine-off  Ansaid Dasin Capsules Magsal Sodium Salicylate  Anturane Depen Capsules Marnal Soma  APF Arthritis pain formula Dewitt's Pills Measurin Stanback  Argesic Dia-Gesic Meclofenamic Sulfinpyrazone  Arthritis Bayer Timed Release Aspirin Diclofenac Meclomen Sulindac  Arthritis pain formula Anacin Dicumarol Medipren Supac  Analgesic (Safety coated) Arthralgen Diffunasal Mefanamic Suprofen  Arthritis Strength Bufferin Dihydrocodeine Mepro Compound Suprol  Arthropan liquid Dopirydamole Methcarbomol with Aspirin Synalgos  ASA tablets/Enseals Disalcid Micrainin Tagament  Ascriptin Doan's Midol Talwin  Ascriptin A/D Dolene Mobidin Tanderil  Ascriptin Extra Strength Dolobid Moblgesic Ticlid  Ascriptin with Codeine Doloprin or Doloprin with Codeine Momentum Tolectin  Asperbuf Duoprin Mono-gesic Trendar  Aspergum Duradyne Motrin or Motrin IB Triminicin  Aspirin plain, buffered or enteric coated Durasal Myochrisine Trigesic  Aspirin Suppositories Easprin Nalfon Trillsate  Aspirin with Codeine Ecotrin Regular or Extra Strength Naprosyn Uracel  Atromid-S Efficin Naproxen Ursinus  Auranofin Capsules Elmiron Neocylate Vanquish  Axotal Emagrin Norgesic Verin  Azathioprine Empirin or Empirin  with Codeine Normiflo Vitamin E  Azolid Emprazil Nuprin Voltaren  Bayer Aspirin plain, buffered or children's or timed BC Tablets or powders Encaprin Orgaran Warfarin Sodium  Buff-a-Comp Enoxaparin Orudis Zorpin  Buff-a-Comp with Codeine Equegesic Os-Cal-Gesic   Buffaprin Excedrin plain, buffered or Extra Strength Oxalid   Bufferin Arthritis Strength Feldene Oxphenbutazone   Bufferin plain or Extra Strength Feldene Capsules Oxycodone with Aspirin   Bufferin with Codeine Fenoprofen Fenoprofen Pabalate or Pabalate-SF   Buffets II Flogesic Panagesic   Buffinol plain or Extra Strength Florinal or Florinal with Codeine Panwarfarin   Buf-Tabs Flurbiprofen Penicillamine   Butalbital Compound Four-way cold tablets Penicillin   Butazolidin Fragmin Pepto-Bismol   Carbenicillin Geminisyn Percodan   Carna Arthritis Reliever Geopen Persantine   Carprofen Gold's salt Persistin   Chloramphenicol Goody's Phenylbutazone   Chloromycetin Haltrain Piroxlcam   Clmetidine heparin Plaquenil   Cllnoril Hyco-pap Ponstel   Clofibrate Hydroxy chloroquine Propoxyphen         Before stopping any of these medications, be sure to consult the physician who ordered them.  Some, such as Coumadin (Warfarin) are ordered to prevent or treat serious conditions such as "deep thrombosis", "pumonary embolisms", and other heart problems.  The amount of time that you may need off of the medication may also vary with the medication and the reason for which you were taking it.  If you are taking any of these medications, please make sure you notify your pain physician before you undergo any procedures.

## 2015-09-10 NOTE — Telephone Encounter (Signed)
Patient is scheduled for 09/14/2015 appointment

## 2015-09-14 ENCOUNTER — Ambulatory Visit (INDEPENDENT_AMBULATORY_CARE_PROVIDER_SITE_OTHER): Payer: 59 | Admitting: Family Medicine

## 2015-09-14 ENCOUNTER — Encounter: Payer: Self-pay | Admitting: Pain Medicine

## 2015-09-14 ENCOUNTER — Encounter: Payer: Self-pay | Admitting: Family Medicine

## 2015-09-14 ENCOUNTER — Ambulatory Visit: Payer: 59 | Attending: Pain Medicine | Admitting: Pain Medicine

## 2015-09-14 VITALS — BP 128/70 | HR 70 | Temp 97.5°F | Resp 16 | Wt 155.6 lb

## 2015-09-14 VITALS — BP 131/74 | HR 89 | Temp 97.8°F | Resp 18 | Ht 66.0 in | Wt 152.0 lb

## 2015-09-14 DIAGNOSIS — M79604 Pain in right leg: Secondary | ICD-10-CM | POA: Insufficient documentation

## 2015-09-14 DIAGNOSIS — M797 Fibromyalgia: Secondary | ICD-10-CM

## 2015-09-14 DIAGNOSIS — M545 Low back pain: Secondary | ICD-10-CM | POA: Diagnosis present

## 2015-09-14 DIAGNOSIS — I10 Essential (primary) hypertension: Secondary | ICD-10-CM

## 2015-09-14 DIAGNOSIS — M79605 Pain in left leg: Secondary | ICD-10-CM | POA: Diagnosis present

## 2015-09-14 DIAGNOSIS — M5136 Other intervertebral disc degeneration, lumbar region: Secondary | ICD-10-CM

## 2015-09-14 DIAGNOSIS — E7800 Pure hypercholesterolemia, unspecified: Secondary | ICD-10-CM | POA: Diagnosis not present

## 2015-09-14 DIAGNOSIS — I7300 Raynaud's syndrome without gangrene: Secondary | ICD-10-CM

## 2015-09-14 DIAGNOSIS — M533 Sacrococcygeal disorders, not elsewhere classified: Secondary | ICD-10-CM

## 2015-09-14 DIAGNOSIS — M47816 Spondylosis without myelopathy or radiculopathy, lumbar region: Secondary | ICD-10-CM

## 2015-09-14 MED ORDER — ORPHENADRINE CITRATE 30 MG/ML IJ SOLN
INTRAMUSCULAR | Status: AC
  Administered 2015-09-14: 60 mg via INTRAMUSCULAR
  Filled 2015-09-14: qty 2

## 2015-09-14 MED ORDER — MIDAZOLAM HCL 5 MG/5ML IJ SOLN
INTRAMUSCULAR | Status: AC
  Administered 2015-09-14: 5 mg via INTRAVENOUS
  Filled 2015-09-14: qty 5

## 2015-09-14 MED ORDER — FENTANYL CITRATE (PF) 100 MCG/2ML IJ SOLN
INTRAMUSCULAR | Status: AC
Start: 2015-09-14 — End: 2015-09-14
  Administered 2015-09-14: 100 ug via INTRAVENOUS
  Filled 2015-09-14: qty 2

## 2015-09-14 MED ORDER — SODIUM CHLORIDE 0.9 % IJ SOLN
INTRAMUSCULAR | Status: AC
  Filled 2015-09-14: qty 20

## 2015-09-14 MED ORDER — TRIAMCINOLONE ACETONIDE 40 MG/ML IJ SUSP
40.0000 mg | Freq: Once | INTRAMUSCULAR | Status: AC
  Administered 2015-09-14: 40 mg

## 2015-09-14 MED ORDER — BUPIVACAINE HCL (PF) 0.25 % IJ SOLN
INTRAMUSCULAR | Status: AC
  Administered 2015-09-14: 30 mL
  Filled 2015-09-14: qty 30

## 2015-09-14 MED ORDER — ATORVASTATIN CALCIUM 40 MG PO TABS: 40.0000 mg | ORAL_TABLET | Freq: Every day | ORAL | Status: DC

## 2015-09-14 MED ORDER — ORPHENADRINE CITRATE 30 MG/ML IJ SOLN
60.0000 mg | Freq: Once | INTRAMUSCULAR | Status: AC
  Administered 2015-09-14: 60 mg via INTRAMUSCULAR

## 2015-09-14 MED ORDER — SODIUM CHLORIDE 0.9 % IJ SOLN: 20.0000 mL | Freq: Once | INTRAMUSCULAR | Status: DC

## 2015-09-14 MED ORDER — FENTANYL CITRATE (PF) 100 MCG/2ML IJ SOLN
100.0000 ug | Freq: Once | INTRAMUSCULAR | Status: AC
  Administered 2015-09-14: 100 ug via INTRAVENOUS

## 2015-09-14 MED ORDER — TRIAMCINOLONE ACETONIDE 40 MG/ML IJ SUSP
INTRAMUSCULAR | Status: AC
  Administered 2015-09-14: 40 mg
  Filled 2015-09-14: qty 1

## 2015-09-14 MED ORDER — MIDAZOLAM HCL 5 MG/5ML IJ SOLN
5.0000 mg | Freq: Once | INTRAMUSCULAR | Status: AC
  Administered 2015-09-14: 5 mg via INTRAVENOUS

## 2015-09-14 MED ORDER — LACTATED RINGERS IV SOLN: 1000.0000 mL | INTRAVENOUS | Status: DC

## 2015-09-14 MED ORDER — BUPIVACAINE HCL (PF) 0.25 % IJ SOLN
30.0000 mL | Freq: Once | INTRAMUSCULAR | Status: AC
  Administered 2015-09-14: 30 mL

## 2015-09-14 NOTE — Patient Instructions (Addendum)
PLAN   Continue present medication hydrocodone acetaminophe  F/U PCP D C Chrissmon for evaliation of  BP and general medical  condition  F/U surgical evaluation. May consider pending follow-up evaluations  F/U neurological evaluation. May consider pending follow-up evaluations  May consider radiofrequency rhizolysis or intraspinal procedures pending response to present treatment and F/U evaluation   Call pain management prior to return appointment if you have concerns regarding conditionSelective Nerve Root Block Patient Information  Description: Specific nerve roots exit the spinal canal and these nerves can be compressed and inflamed by a bulging disc and bone spurs.  By injecting steroids on the nerve root, we can potentially decrease the inflammation surrounding these nerves, which often leads to decreased pain.  Also, by injecting local anesthesia on the nerve root, this can provide [place] helpful information to give to your referring doctor if it decreases your pain.  Selective nerve root blocks can be done along the spine from the neck to the low back depending on the location of your pain.   After numbing the skin with local anesthesia, a small needle is passed to the nerve root and the position of the needle is verified using x-ray pictures.  After the needle is in correct position, we then deposit the medication.  You may experience a pressure sensation while this is being done.  The entire block usually lasts less than 15 minutes.  Conditions that may be treated with selective nerve root blocks:  Low back and leg pain  Spinal stenosis  Diagnostic block prior to potential surgery  Neck and arm pain  Post laminectomy syndrome  Preparation for the injection:  1. Do not eat any solid food or dairy products within 6 hours of your appointment. 2. You may drink clear liquids up to 2 hours before an appointment.  Clear liquids include water, black coffee, juice or soda.  No milk or  cream please. 3. You may take your regular medications, including pain medications, with a sip of water before your appointment.  Diabetics should hold regular insulin (if taken separately) and take 1/2 normal NPH dose the morning of the procedure.  Carry some sugar containing items with you to your appointment. 4. A driver must accompany you and be prepared to drive you home after your procedure. 5. Bring all your current medications with you. 6. An IV may be inserted and sedation may be given at the discretion of the physician. 7. A blood pressure cuff, EKG, and other monitors will often be applied during the procedure.  Some patients may need to have extra oxygen administered for a short period. 8. You will be asked to provide medical information, including allergies, prior to the procedure.  We must know immediately if you are taking blood  Thinners (like Coumadin) or if you are allergic to IV iodine contrast (dye).  Possible side-effects: All are usually temporary  Bleeding from needle site  Light headedness  Numbness and tingling  Decreased blood pressure  Weakness in arms/legs  Pressure sensation in back/neck  Pain at injection site (several days)  Possible complications: All are extremely rare  Infection  Nerve injury  Spinal headache (a headache wore with upright position)  Call if you experience:  Fever/chills associated with headache or increased back/neck pain  Headache worsened by an upright position  New onset weakness or numbness of an extremity below the injection site  Hives or difficulty breathing (go to the emergency room)  Inflammation or drainage at the injection site(s)  Severe back/neck pain greater than usual  New symptoms which are concerning to you  Please note:  Although the local anesthetic injected can often make your back or neck feel good for several hours after the injection the pain will likely return.  It takes 3-5 days for  steroids to work on the nerve root. You may not notice any pain relief for at least one week.  If effective, we will often do a series of 3 injections spaced 3-6 weeks apart to maximally decrease your pain.    If you have any questions, please call [phone] [facility] Discharge Inst[facility]  General Discharge Instructions :  If you need to reach your doctor call: Monday-Friday 8:00 am - 4:00 pm at [phone] or toll free [phone].  After clinic hours [phone] to have operator reach doctor.  Bring all of your medication bottles to all your appointments in the pain clinic.  To cancel or reschedule your appointment with Pain Management please remember to call 24 hours in advance to avoid a fee.  Refer to the educational materials which you have been given on: General Risks, I had my Procedure. Discharge Instructions, Post Sedation.  Post Procedure Instructions:  The drugs you were given will stay in your system until tomorrow, so for the next 24 hours you should not drive, make any legal decisions or drink any alcoholic beverages.  You may eat anything you prefer, but it is better to start with liquids then soups and crackers, and gradually work up to solid foods.  Please notify your doctor immediately if you have any unusual bleeding, trouble breathing or pain that is not related to your normal pain.  Depending on the type of procedure that was done, some parts of your body may feel week and/or numb.  This usually clears up by tonight or the next day.  Walk with the use of an assistive device or accompanied by an adult for the 24 hours.  You may use ice on the affected area for the first 24 hours.  Put ice in a Ziploc bag and cover with a towel and place against area 15 minutes on 15 minutes off.  You may switch to heat after 24 hours.

## 2015-09-14 NOTE — Progress Notes (Signed)
Patient ID: Joshua Acevedo, male   DOB: 1961/04/23, 54 y.o.   MRN: [id number]    Subjective:  Hypertension This is a chronic problem. The problem is controlled (Noticed more elevations at the pain management clinic). Pertinent negatives include no chest pain or shortness of breath. Treatments tried: been on Nifedipine for Raynaud's Phenomena without side effects.  Hyperlipidemia This is a chronic problem. The current episode started more than 1 year ago. The problem is controlled. Factors aggravating his hyperlipidemia include fatty foods. Pertinent negatives include no chest pain, focal weakness or shortness of breath. Current antihyperlipidemic treatment includes statins.    Prior to Admission medications   Medication Sig Start Date End Date Taking? Authorizing Provider  ALPRAZolam Duanne Moron) 0.5 MG tablet Take 1 tablet (0.5 mg total) by mouth 3 (three) times daily as needed. 08/31/15  Yes Tameya Kuznia E Meta Kroenke, PA  atorvastatin (LIPITOR) 40 MG tablet Take 1 tablet (40 mg total) by mouth daily. 05/01/15  Yes Shakenya Stoneberg E Rashan Rounsaville, PA  cholecalciferol (VITAMIN D) 1000 UNITS tablet Take 2 tablets by mouth daily.   Yes Historical Provider, MD  cyclobenzaprine (FLEXERIL) 10 MG tablet TAKE 1 TABLET TWICE DAILY AS NEEDED 08/04/15  Yes Karletta Millay E Trevel Dillenbeck, PA  fluticasone (FLONASE) 50 MCG/ACT nasal spray Place 2 sprays into both nostrils daily.   Yes Historical Provider, MD  HYDROcodone-acetaminophen [facility]) 10-325 MG tablet Limit 3-6 tablets by mouth per day if tolerated 09/08/15  Yes Mohammed Kindle, MD  NIFEDIPINE PO Take 20 mg by mouth daily. AM   Yes Historical Provider, MD  Omeprazole 20 MG TBEC Take 2 tablets by mouth daily.   Yes Historical Provider, MD    Patient Active Problem List   Diagnosis Date Noted  . Special screening for malignant neoplasms, colon   . Benign neoplasm of descending colon   . Facet syndrome, lumbar 08/10/2015  . Fibromyalgia 06/25/2015  . Primary Raynaud's phenomenon 06/25/2015    . Arthralgia of multiple joints 03/16/2015  . Chronic pain associated with significant psychosocial dysfunction 03/16/2015  . Family history of arthritis 03/16/2015  . Fatigue 03/16/2015  . Acid reflux 03/16/2015  . Gout 03/16/2015  . Hypercholesteremia 03/16/2015  . Benign hypertension 03/16/2015  . Borderline diabetes 03/16/2015  . Decreased libido 03/16/2015  . 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 Cannabis abuse 03/16/2015  . Changeable mood ([place]) 03/16/2015  . Cramp of limb 03/16/2015  . Acquired trigger finger 03/16/2015  . Raynaud's syndrome 03/16/2015  . Avitaminosis D 03/16/2015  . Paroxysmal digital cyanosis 01/08/2014  . Breathlessness on exertion 12/15/2009  . Muscle ache 07/14/2009  . Bone marrow failure ([place]) 07/08/2009  . LBP (low back pain) 05/15/2009  . Hepatitis C virus infection without hepatic coma 06/02/2008  . Malaise and fatigue 05/01/2008    Past Medical History  Diagnosis Date  . Hypertension   . Hyperlipidemia   . GERD (gastroesophageal reflux disease)   . Arthritis     "everywhere"  . Shortness of breath dyspnea   . Hepatitis 2009    "c" - treated  . Wears dentures     partial upper    Social History   Social History  . Marital Status: Married    Spouse Name: N/A  . Number of Children: N/A  . Years of Education: N/A   Occupational History  . Not on file.   Social History Main Topics  . Smoking status: Former Smoker -- 1.00 packs/day for 40 years    Types: Cigarettes    Quit date:  08/19/2015  . Smokeless tobacco: Never Used  . Alcohol Use: No     Comment: formerly was a heavy user  . Drug Use: No     Comment: previously used marijuana, quit in 2009 when diagnosed with Hepatitis C  . Sexual Activity: Not on file   Other Topics Concern  . Not on file   Social History Narrative    No Known Allergies  Review of Systems  Constitutional: Negative.   HENT: Negative.   Eyes: Negative.   Respiratory: Negative.  Negative for  shortness of breath.   Cardiovascular: Negative.  Negative for chest pain.  Gastrointestinal: Negative.   Genitourinary: Negative.   Musculoskeletal: Negative.   Skin: Negative.   Neurological: Negative.  Negative for focal weakness.  Endo/Heme/Allergies: Negative.   Psychiatric/Behavioral: Negative.     Immunization History  Administered Date(s) Administered  . Hepatitis A 08/01/2008, 09/12/2008  . Hepatitis B 08/01/2008, 09/12/2008, 03/20/2009   Objective:  BP 128/70 mmHg  Pulse 70  Temp(Src) 97.5 F (36.4 C) (Oral)  Resp 16  Wt 155 lb 9.6 oz (70.58 kg)  SpO2 97%  Physical Exam  Constitutional: He is oriented to person, place, and time and well-developed, well-nourished, and in no distress.  HENT:  Head: Normocephalic.  Eyes: Conjunctivae and EOM are normal.  Neck: Normal range of motion. Neck supple.  Cardiovascular: Normal rate and regular rhythm.   Pulmonary/Chest: Effort normal and breath sounds normal.  Abdominal: Soft. Bowel sounds are normal.  Musculoskeletal:  Chronic pain in muscles and joints of legs and lower back followed by Dr. Primus Bravo (pain management). Had spinal injection this morning.  Neurological: He is alert and oriented to person, place, and time.  Skin: No rash noted.  Psychiatric: Affect and judgment normal.    Lab Results  Component Value Date   WBC 8.0 04/24/2015   HGB 13.1* 02/27/2015   HCT 39.0 04/24/2015   PLT 205 02/27/2015   GLUCOSE 68 04/24/2015   CHOL 308* 04/24/2015   TRIG 195* 04/24/2015   HDL 28* 04/24/2015   LDLCALC 241* 04/24/2015   TSH 1.44 02/27/2015    CMP     Component Value Date/Time   NA 142 04/24/2015 1558   NA 141 08/22/2012 1950   K 4.4 04/24/2015 1558   K 3.4* 08/22/2012 1950   CL 98 04/24/2015 1558   CL 104 08/22/2012 1950   CO2 30* 04/24/2015 1558   CO2 26 08/22/2012 1950   GLUCOSE 68 04/24/2015 1558   GLUCOSE 112* 08/22/2012 1950   BUN 5* 04/24/2015 1558   BUN 7 08/22/2012 1950   CREATININE 0.62*  04/24/2015 1558   CREATININE 0.7 02/27/2015   CREATININE 0.87 08/22/2012 1950   CALCIUM 10.0 04/24/2015 1558   CALCIUM 9.7 08/22/2012 1950   PROT 7.4 04/24/2015 1558   PROT 8.5* 08/22/2012 1950   ALBUMIN 4.8 04/24/2015 1558   ALBUMIN 4.9 08/22/2012 1950   AST 21 04/24/2015 1558   AST 22 08/22/2012 1950   ALT 28 04/24/2015 1558   ALT 53 08/22/2012 1950   ALKPHOS 112 04/24/2015 1558   ALKPHOS 108 08/22/2012 1950   BILITOT <0.2 04/24/2015 1558   BILITOT 0.3 08/22/2012 1950   GFRNONAA 112 04/24/2015 1558   GFRNONAA >60 08/22/2012 1950   GFRAA 129 04/24/2015 1558   GFRAA >60 08/22/2012 1950    Assessment and Plan :  1. Benign hypertension Elevations of BP at pain management occasionally. Well controlled at the present and had spinal injections earlier this morning.  Will check routine lab and continue Nifedipine and alprazolam for anxiety. Follow up pending reports. - CBC with Differential/Platelet  2. Hypercholesteremia On 04-24-15, LDL was 241, triglycerides 195 with a total cholesterol o 308. Tolerating Lipitor 40 mg qd without side effects. Will refill Lipitor and continue low fat high fiber diet. Recheck labs and follow up pending reports. - Lipid panel - COMPLETE METABOLIC PANEL WITH GFR - atorvastatin (LIPITOR) 40 MG tablet; Take 1 tablet (40 mg total) by mouth daily.  Dispense: 90 tablet; Refill: [facility] 09/14/2015 1:25 PM

## 2015-09-14 NOTE — Progress Notes (Signed)
Subjective:    Patient ID: Joshua Acevedo, male    DOB: May 25, 1961, 54 y.o.   MRN: [id number]  HPI  PROCEDURE:  Block of nerves to the sacroiliac joint.   NOTE:  The patient is a 54 y.o. male who returns to the [facility] Management Center for further evaluation and treatment of pain involving the lower back and lower extremity region with pain in the region of the buttocks as well. The patient is with reproduction of severe pain with palpation over the PSIS and PII S regions and is with positive Patrick's maneuver as well There is concern regarding a significant component of the patient's pain being due to sacroiliac joint dysfunction The risks, benefits, expectations of the procedure have been discussed and explained to the patient who is understanding and willing to proceed with interventional treatment in attempt to decrease severity of patient's symptoms, minimize the risk of medication escalation and  hopefully retard the progression of the patient's symptoms. We will proceed with what is felt to be a medically necessary procedure, block of nerves to the sacroiliac joint.   DESCRIPTION OF PROCEDURE:  Block of nerves to the sacroiliac joint.   The patient was taken to the fluoroscopy suite. With the patient in the prone position with EKG, blood pressure, pulse and pulse oximetry monitoring, IV Versed, IV fentanyl conscious sedation, Betadine prep of proposed entry site was performed.   Block of nerves at the L5 vertebral body level.   With the patient in prone position, under fluoroscopic guidance, a 22 -gauge needle was inserted at the L5 vertebral body level on the left side. With 15 degrees oblique orientation a 22 -gauge needle was inserted in the region known as Burton's eye or eye of the Scotty dog. Following documentation of needle placement in the area of Burton's eye or eye of the Scotty dog under fluoroscopic guidance, needle placement was then accomplished at the sacral ala level on the  left side.   Needle placement at the sacral ala.   With the patient in prone position under fluoroscopic guidance with AP view of the lumbosacral spine, a 22 -gauge needle was inserted in the region known as the sacral ala on the left side. Following documentation of needle placement on the left side under fluoroscopic guidance needle placement was then accomplished at the S1 foramen level.   Needle placement at the S1 foramen level.   With the patient in prone position under fluoroscopic guidance with AP view of the lumbosacral spine and cephalad orientation, a 22 -gauge needle was inserted at the superior and lateral border of the S1 foramen on the left side. Following documentation of needle placement at the S1 foramen level on the left side, needle placement was then accomplished at the S2 foramen level on the left side.   Needle placement at the S2 foramen level.   With the patient in prone position with AP view of the lumbosacral spine with cephalad orientation, a 22 - gauge needle was inserted at the superior and lateral border of the S2 foramen under fluoroscopic guidance on the left side. Following needle placement at the L5 vertebral body level, sacral ala, S1 foramen and S2 foramen on the left left left left side, needle placement was verified on lateral view under fluoroscopic guidance.  Following needle placement documentation on lateral view, each needle was injected with 1 mL of 0.25% bupivacaine and Kenalog.   BLOCK OF THE NERVES TO SACROILIAC JOINT ON THE RIGHT SIDE The  procedure was performed on the right side at the same levels as was performed on the left side and utilizing the same technique as on the left side and was performed under fluoroscopic guidance as on the left side   A total of 10mg  of Kenalog was utilized for the procedure.   PLAN:  1. Medications: The patient will continue presently prescribed medications hydrocodone acetaminophen.  2. The patient will be  considered for modification of treatment regimen pending response to the procedure performed on today's visit.  3. The patient is to follow-up with primary care physician D Chrissmon for evaluation of blood pressure and general medical condition following the procedure performed on today's visit.  4. Surgical evaluation as discussed. May consider 5. Neurological evaluation as discussed. May consider PNCV EMG studies and others 6. The patient may be a candidate for radiofrequency procedures, implantation devices and other treatment pending response to treatment performed on today's visit and follow-up evaluation.  7. The patient has been advised to adhere to proper body mechanics and to avoid activities which may exacerbate the patient's symptoms.   Return appointment to Pain Management Center as scheduled.        Review of Systems     Objective:   Physical Exam        Assessment & Plan:

## 2015-09-15 ENCOUNTER — Telehealth: Payer: Self-pay | Admitting: *Deleted

## 2015-09-15 NOTE — Telephone Encounter (Signed)
Left voicemail re; procedure on 09/14/2015, to call our office with any problems or concerns.

## 2015-09-28 ENCOUNTER — Other Ambulatory Visit: Payer: Self-pay | Admitting: Family Medicine

## 2015-10-06 ENCOUNTER — Ambulatory Visit: Payer: 59 | Attending: Pain Medicine | Admitting: Pain Medicine

## 2015-10-06 ENCOUNTER — Encounter: Payer: Self-pay | Admitting: Pain Medicine

## 2015-10-06 VITALS — BP 117/59 | HR 81 | Temp 98.2°F | Resp 16 | Ht 66.0 in | Wt 150.0 lb

## 2015-10-06 DIAGNOSIS — B1920 Unspecified viral hepatitis C without hepatic coma: Secondary | ICD-10-CM | POA: Insufficient documentation

## 2015-10-06 DIAGNOSIS — M5136 Other intervertebral disc degeneration, lumbar region: Secondary | ICD-10-CM

## 2015-10-06 DIAGNOSIS — M47816 Spondylosis without myelopathy or radiculopathy, lumbar region: Secondary | ICD-10-CM

## 2015-10-06 DIAGNOSIS — M533 Sacrococcygeal disorders, not elsewhere classified: Secondary | ICD-10-CM | POA: Insufficient documentation

## 2015-10-06 DIAGNOSIS — M329 Systemic lupus erythematosus, unspecified: Secondary | ICD-10-CM | POA: Insufficient documentation

## 2015-10-06 DIAGNOSIS — M79605 Pain in left leg: Secondary | ICD-10-CM | POA: Diagnosis present

## 2015-10-06 DIAGNOSIS — M545 Low back pain: Secondary | ICD-10-CM | POA: Diagnosis present

## 2015-10-06 DIAGNOSIS — M1990 Unspecified osteoarthritis, unspecified site: Secondary | ICD-10-CM | POA: Insufficient documentation

## 2015-10-06 DIAGNOSIS — I7300 Raynaud's syndrome without gangrene: Secondary | ICD-10-CM | POA: Insufficient documentation

## 2015-10-06 DIAGNOSIS — M797 Fibromyalgia: Secondary | ICD-10-CM | POA: Diagnosis not present

## 2015-10-06 DIAGNOSIS — R7303 Prediabetes: Secondary | ICD-10-CM | POA: Diagnosis not present

## 2015-10-06 DIAGNOSIS — M79604 Pain in right leg: Secondary | ICD-10-CM | POA: Diagnosis present

## 2015-10-06 MED ORDER — HYDROCODONE-ACETAMINOPHEN 10-325 MG PO TABS: ORAL_TABLET | ORAL | Status: DC

## 2015-10-06 MED ORDER — GABAPENTIN 300 MG PO CAPS: ORAL_CAPSULE | ORAL | Status: DC

## 2015-10-06 NOTE — Progress Notes (Signed)
   Subjective:    Patient ID: Joshua Acevedo, male    DOB: [birth year], 54 y.o.   MRN: [id number]  HPI The patient is a 54 year old gentleman who returns to pain management for further evaluation and treatment of pain involving the lower back and lower extremity region. The patient stated that he had remarkable relief of pain following block of nerves to the sacroiliac joint and he wishes to undergo the procedure again. We discussed patient's condition and patient stated that he was able to stand as well as arise from lying down on squatting positions more easily since he had the procedure. Patient states that all of his body mechanics has improved since the procedure. Patient denies any trauma change in events of daily living because change in symptomatology. We discussed patient's condition and will continue medications and will begin Neurontin and will consider patient for block of nerves to the sacral joint at time return appointment. The patient was with understanding and agreed to suggested treatment plan.     Review of Systems     Objective:   Physical Exam  There was minimal tenderness to palpation of the paraspinal must reason cervical region cervical facet region palpation which reproduces minimal discomfort. There was minimal tenderness over the splenius capitis and occipitalis musculature region. Palpation over the thoracic facet and cervical facet regions reproduces minimal discomfort. There was no crepitus of the thoracic region noted. Patient appeared to be with bilaterally equal grip strength and Tinel and Phalen's maneuver were without increased pain of significant degree. Palpation over the lumbar paraspinal must reason lumbar facet region was with moderate discomfort. Palpation over the PSIS and PII S region reproduced moderate to moderately severe discomfort. There was mild tenderness of the greater trochanteric region iliotibial band region. Palpation over the lumbar facet lumbar  paraspinal must reason reproduced moderate discomfort with lateral bending rotation extension and palpation of the lumbar facets reproducing moderate discomfort. DTRs difficult to elicit patient had difficulty relaxing. EHL strength appeared to be decreased. No definite sensory deficit or dermatomal distribution detected. Negative clonus negative Homans. Abdomen nontender with no costovertebral tenderness noted. The patient's pain was increased with palpation of the PSIS and PII S region.      Assessment & Plan:      Sacroiliac joint dysfunction  Lumbar facet syndrome  Systemic lupus erythematosus  Raynaud's syndrome  Fibromyalgia  Osteoarthritis  Hepatitis C  Borderline diabetes     PLAN    Continue present medication hydrocodone acetaminophen and begin Neurontin. Caution Neurontin can cause drowsiness excessive sedation and respiratory depression and confusion   Block of nerves to the sacroiliac joint to be performed at time return appointment  F/U PCP D C Chrissmon for evaliation of  BP and general medical  condition  F/U surgical evaluation. May consider pending follow-up evaluations  F/U neurological evaluation. May consider pending follow-up evaluations  May consider radiofrequency rhizolysis or intraspinal procedures pending response to present treatment and F/U evaluation  Patient is to call pain management prior to scheduled return appointment should patient have change in condition or have concerns regarding condition prior to scheduled return appointment

## 2015-10-06 NOTE — Patient Instructions (Addendum)
PLAN   Continue present medication hydrocodone acetaminophen and begin Neurontin. Caution Neurontin can cause drowsiness excessive sedation and respiratory depression and confusion   Block of nerves to the sacroiliac joint to be performed at time return appointment  F/U PCP D C Chrissmon for evaliation of  BP and general medical  condition  F/U surgical evaluation. May consider pending follow-up evaluations  F/U neurological evaluation. May consider pending follow-up evaluations  May consider radiofrequency rhizolysis or intraspinal procedures pending response to present treatment and F/U evaluation   Call pain management prior to return appointment if you have concerns regarding condition

## 2015-10-06 NOTE — Progress Notes (Signed)
Safety precautions to be maintained throughout the outpatient stay will include: orient to surroundings, keep bed in low position, maintain call bell within reach at all times, provide assistance with transfer out of bed and ambulation.  

## 2015-10-07 LAB — CBC WITH DIFFERENTIAL/PLATELET
Basophils Absolute: 0 10*3/uL (ref 0.0–0.2)
Basos: 0 %
EOS (ABSOLUTE): 0.1 10*3/uL (ref 0.0–0.4)
Eos: 1 %
Hematocrit: 39.7 % (ref 37.5–51.0)
Hemoglobin: 13.9 g/dL (ref 12.6–17.7)
IMMATURE GRANULOCYTES: 0 %
Immature Grans (Abs): 0 10*3/uL (ref 0.0–0.1)
Lymphocytes Absolute: 2.7 10*3/uL (ref 0.7–3.1)
Lymphs: 42 %
MCH: 30.8 pg (ref 26.6–33.0)
MCHC: 35 g/dL (ref 31.5–35.7)
MCV: 88 fL (ref 79–97)
MONOS ABS: 0.4 10*3/uL (ref 0.1–0.9)
Monocytes: 7 %
NEUTROS PCT: 50 %
Neutrophils Absolute: 3.2 10*3/uL (ref 1.4–7.0)
PLATELETS: 238 10*3/uL (ref 150–379)
RBC: 4.52 x10E6/uL (ref 4.14–5.80)
RDW: 13.1 % (ref 12.3–15.4)
WBC: 6.5 10*3/uL (ref 3.4–10.8)

## 2015-10-07 LAB — COMPREHENSIVE METABOLIC PANEL
A/G RATIO: 2.1 (ref 1.1–2.5)
ALK PHOS: 104 IU/L (ref 39–117)
ALT: 30 IU/L (ref 0–44)
AST: 21 IU/L (ref 0–40)
Albumin: 4.8 g/dL (ref 3.5–5.5)
BUN/Creatinine Ratio: 8 — ABNORMAL LOW (ref 9–20)
BUN: 6 mg/dL (ref 6–24)
Bilirubin Total: 0.3 mg/dL (ref 0.0–1.2)
CALCIUM: 9.7 mg/dL (ref 8.7–10.2)
CHLORIDE: 101 mmol/L (ref 96–106)
CO2: 27 mmol/L (ref 18–29)
Creatinine, Ser: 0.71 mg/dL — ABNORMAL LOW (ref 0.76–1.27)
GFR calc Af Amer: 121 mL/min/{1.73_m2} (ref >59)
GFR, EST NON AFRICAN AMERICAN: 105 mL/min/{1.73_m2} (ref >59)
Globulin, Total: 2.3 g/dL (ref 1.5–4.5)
Glucose: 113 mg/dL — ABNORMAL HIGH (ref 65–99)
POTASSIUM: 4.1 mmol/L (ref 3.5–5.2)
Sodium: 144 mmol/L (ref 134–144)
Total Protein: 7.1 g/dL (ref 6.0–8.5)

## 2015-10-07 LAB — LIPID PANEL
CHOLESTEROL TOTAL: 266 mg/dL — AB (ref 100–199)
Chol/HDL Ratio: 8.1 ratio units — ABNORMAL HIGH (ref 0.0–5.0)
HDL: 33 mg/dL — ABNORMAL LOW (ref >39)
LDL CALC: 215 mg/dL — AB (ref 0–99)
TRIGLYCERIDES: 90 mg/dL (ref 0–149)
VLDL CHOLESTEROL CAL: 18 mg/dL (ref 5–40)

## 2015-10-09 ENCOUNTER — Telehealth: Payer: Self-pay

## 2015-10-09 NOTE — Telephone Encounter (Signed)
Patient advised as directed below. Patient verbalized understanding.  

## 2015-10-09 NOTE — Telephone Encounter (Signed)
LMTCB

## 2015-10-09 NOTE — Telephone Encounter (Signed)
-----   Message from Margo Common, [place] sent at 10/08/2015  5:01 PM EST ----- Cholesterol showing improvement. Need to continue present pravastatin and low fat diet. Recheck levels in 4 months.

## 2015-10-21 ENCOUNTER — Encounter: Payer: Self-pay | Admitting: Pain Medicine

## 2015-10-21 ENCOUNTER — Telehealth: Payer: Self-pay | Admitting: Pain Medicine

## 2015-10-21 ENCOUNTER — Other Ambulatory Visit: Payer: Self-pay | Admitting: Pain Medicine

## 2015-10-21 ENCOUNTER — Ambulatory Visit: Payer: 59 | Attending: Pain Medicine | Admitting: Pain Medicine

## 2015-10-21 VITALS — BP 125/88 | HR 74 | Temp 97.9°F | Resp 16 | Ht 66.0 in | Wt 150.0 lb

## 2015-10-21 DIAGNOSIS — M545 Low back pain: Secondary | ICD-10-CM | POA: Insufficient documentation

## 2015-10-21 DIAGNOSIS — M47816 Spondylosis without myelopathy or radiculopathy, lumbar region: Secondary | ICD-10-CM

## 2015-10-21 DIAGNOSIS — I7300 Raynaud's syndrome without gangrene: Secondary | ICD-10-CM

## 2015-10-21 DIAGNOSIS — M5136 Other intervertebral disc degeneration, lumbar region: Secondary | ICD-10-CM

## 2015-10-21 DIAGNOSIS — M79606 Pain in leg, unspecified: Secondary | ICD-10-CM | POA: Insufficient documentation

## 2015-10-21 DIAGNOSIS — M533 Sacrococcygeal disorders, not elsewhere classified: Secondary | ICD-10-CM

## 2015-10-21 DIAGNOSIS — M797 Fibromyalgia: Secondary | ICD-10-CM

## 2015-10-21 MED ORDER — ORPHENADRINE CITRATE 30 MG/ML IJ SOLN
INTRAMUSCULAR | Status: AC
  Administered 2015-10-21: 14:00:00
  Filled 2015-10-21: qty 2

## 2015-10-21 MED ORDER — TRIAMCINOLONE ACETONIDE 40 MG/ML IJ SUSP: 40.0000 mg | Freq: Once | INTRAMUSCULAR | Status: DC

## 2015-10-21 MED ORDER — BUPIVACAINE HCL (PF) 0.25 % IJ SOLN
INTRAMUSCULAR | Status: AC
  Administered 2015-10-21: 14:00:00
  Filled 2015-10-21: qty 30

## 2015-10-21 MED ORDER — MIDAZOLAM HCL 5 MG/5ML IJ SOLN
INTRAMUSCULAR | Status: AC
  Administered 2015-10-21: 5 mg via INTRAVENOUS
  Filled 2015-10-21: qty 5

## 2015-10-21 MED ORDER — CEFAZOLIN SODIUM 1 G IJ SOLR
INTRAMUSCULAR | Status: AC
  Administered 2015-10-21: 1 g via INTRAVENOUS
  Filled 2015-10-21: qty 10

## 2015-10-21 MED ORDER — MIDAZOLAM HCL 5 MG/5ML IJ SOLN: 5.0000 mg | Freq: Once | INTRAMUSCULAR | Status: DC

## 2015-10-21 MED ORDER — CEFAZOLIN SODIUM 1-5 GM-% IV SOLN: 1.0000 g | Freq: Once | INTRAVENOUS | Status: DC

## 2015-10-21 MED ORDER — FENTANYL CITRATE (PF) 100 MCG/2ML IJ SOLN
INTRAMUSCULAR | Status: AC
  Administered 2015-10-21: 100 ug via INTRAVENOUS
  Filled 2015-10-21: qty 2

## 2015-10-21 MED ORDER — TRIAMCINOLONE ACETONIDE 40 MG/ML IJ SUSP
INTRAMUSCULAR | Status: AC
  Administered 2015-10-21: 14:00:00
  Filled 2015-10-21: qty 1

## 2015-10-21 MED ORDER — FENTANYL CITRATE (PF) 100 MCG/2ML IJ SOLN: 100.0000 ug | Freq: Once | INTRAMUSCULAR | Status: DC

## 2015-10-21 MED ORDER — SODIUM CHLORIDE 0.9 % IJ SOLN
INTRAMUSCULAR | Status: AC
  Filled 2015-10-21: qty 20

## 2015-10-21 MED ORDER — SODIUM CHLORIDE 0.9 % IJ SOLN: 20.0000 mL | Freq: Once | INTRAMUSCULAR | Status: DC

## 2015-10-21 NOTE — Progress Notes (Signed)
Subjective:    Patient ID: Joshua Acevedo, male    DOB: 01-17-1961, 55 y.o.   MRN: [id number]  HPI PROCEDURE:  Block of nerves to the sacroiliac joint.   NOTE:  The patient is a 54 y.o. male who returns to the Pain Management Center for further evaluation and treatment of pain involving the lower back and lower extremity region with pain in the region of the buttocks as well.  The patient is with reproduction of severe pain with palpation over the PSIS and P I I S regions  There is concern regarding a significant component of the patient's pain being due to sacroiliac joint dysfunction The risks, benefits, expectations of the procedure have been discussed and explained to the patient who is understanding and willing to proceed with interventional treatment in attempt to decrease severity of patient's symptoms, minimize the risk of medication escalation and  hopefully retard the progression of the patient's symptoms. We will proceed with what is felt to be a medically necessary procedure, block of nerves to the sacroiliac joint.   DESCRIPTION OF PROCEDURE:  Block of nerves to the sacroiliac joint.   The patient was taken to the fluoroscopy suite. With the patient in the prone position with EKG, blood pressure, pulse and pulse oximetry monitoring, IV Versed, IV fentanyl conscious sedation, Betadine prep of proposed entry site was performed.   Block of nerves at the L5 vertebral body level.   With the patient in prone position, under fluoroscopic guidance, a 22 -gauge needle was inserted at the L5 vertebral body level on the  left side. With 15 degrees oblique orientation a 22 -gauge needle was inserted in the region known as Burton's eye or eye of the Scotty dog. Following documentation of needle placement in the area of Burton's eye or eye of the Scotty dog under fluoroscopic guidance, needle placement was then accomplished at the sacral ala level on the  left side.   Needle placement at the sacral  ala.   With the patient in prone position under fluoroscopic guidance with AP view of the lumbosacral spine, a 22 -gauge needle was inserted in the region known as the sacral ala on the  left side. Following documentation of needle placement on the  left side under fluoroscopic guidance needle placement was then accomplished at the S1 foramen level.   Needle placement at the S1 foramen level.   With the patient in prone position under fluoroscopic guidance with AP view of the lumbosacral spine and cephalad orientation, a 22 -gauge needle was inserted at the superior and lateral border of the S1 foramen on the  left side. Following documentation of needle placement at the S1 foramen level on the  left side, needle placement was then accomplished at the S2 foramen level on the  left side.   Needle placement at the S2 foramen level.   With the patient in prone position with AP view of the lumbosacral spine with cephalad orientation, a 22 - gauge needle was inserted at the superior and lateral border of the S2 foramen under fluoroscopic guidance on the  left side. Following needle placement at the L5 vertebral body level, sacral ala, S1 foramen and S2 foramen on the  left side, needle placement was verified on lateral view under fluoroscopic guidance.  Following needle placement documentation on lateral view, each needle was injected with 1 mL of 0.25% bupivacaine and Kenalog.   BLOCK OF THE NERVES TO SACROILIAC JOINT ON THE RIGHT SIDE  The procedure was performed on the right side at the same levels as was performed on the left side and utilizing the same technique as on the left side and was performed under fluoroscopic guidance as on the left side   The patient tolerated the procedure well   A total of 10mg  of Kenalog was utilized for the procedure.   PLAN:  1. Medications: The patient will continue presently prescribed medications.  Neurontin and hydrocodone acetaminophen 2. The patient will  be considered for modification of treatment regimen pending response to the procedure performed on today's visit.  3. The patient is to follow-up with primary care physician D Chrissmon for evaluation of blood pressure and general medical condition following the procedure performed on today's visit.  4. Surgical evaluation as discussed.  5. Neurological evaluation as discussed.  6. The patient may be a candidate for radiofrequency procedures, implantation devices and other treatment pending response to treatment performed on today's visit and follow-up evaluation.  7. The patient has been advised to adhere to proper body mechanics and to avoid activities which may exacerbate the patient's symptoms.   Return appointment to Pain Management Center as scheduled.     Review of Systems     Objective:   Physical Exam        Assessment & Plan:

## 2015-10-21 NOTE — Progress Notes (Signed)
Safety precautions to be maintained throughout the outpatient stay will include: orient to surroundings, keep bed in low position, maintain call bell within reach at all times, provide assistance with transfer out of bed and ambulation.  Pt would like IV sedation today- pt called worked and states he doesn't have to go back- talked with pt about not driving today and needs his wife to drive him home

## 2015-10-21 NOTE — Patient Instructions (Addendum)
PLAN   Continue present medication hydrocodone acetaminophe  F/U PCP D C Chrissmon for evaliation of  BP and general medical  condition  F/U surgical evaluation. May consider pending follow-up evaluations  F/U neurological evaluation. May consider pending follow-up evaluations  May consider radiofrequency rhizolysis or intraspinal procedures pending response to present treatment and F/U evaluation   Call pain management prior to return appointment if you have concerns regarding conditionPATIENT INSTRUCTIONS POST-ANESTHESIA  IMMEDIATELY FOLLOWING SURGERY:  Do not drive or operate machinery for the first twenty four hours after surgery.  Do not make any important decisions for twenty four hours after surgery or while taking narcotic pain medications or sedatives.  If you develop intractable nausea and vomiting or a severe headache please notify your doctor immediately.  FOLLOW-UP:  Please make an appointment with your surgeon as instructed. You do not need to follow up with anesthesia unless specifically instructed to do so.  WOUND CARE INSTRUCTIONS (if applicable):  Keep a dry clean dressing on the anesthesia/puncture wound site if there is drainage.  Once the wound has quit draining you may leave it open to air.  Generally you should leave the bandage intact for twenty four hours unless there is drainage.  If the epidural site drains for more than 36-48 hours please call the anesthesia department.  QUESTIONS?:  Please feel free to call your physician or the hospital operator if you have any questions, and they will be happy to assist you.

## 2015-10-22 ENCOUNTER — Telehealth: Payer: Self-pay

## 2015-10-22 NOTE — Telephone Encounter (Signed)
error 

## 2015-10-22 NOTE — Telephone Encounter (Signed)
Wife states that Joshua Acevedo went to work today but a little sore- instructed to call with any problems

## 2015-10-27 LAB — TOXASSURE SELECT 13 (MW), URINE: PDF: 0

## 2015-10-28 ENCOUNTER — Telehealth: Payer: Self-pay | Admitting: Family Medicine

## 2015-10-28 NOTE — Telephone Encounter (Signed)
Please review

## 2015-10-28 NOTE — Telephone Encounter (Signed)
Pt stated that his insurance changed to [place] and Dr. Primus Bravo with the pain clinic doesn't except that insurance. Pt would like to know where he should go or what he needs to do. Thanks TNP

## 2015-10-29 NOTE — Telephone Encounter (Signed)
Patient returned call. Spoke with Angie in billing at [facility] pain clinic. Per Angie patient needs to contact [employer] and see if Dr. Primus Bravo, Dr. Claybon Jabs, Dr. Idelia Salm, or Dr. Andree Elk are in network or if out of network due to patient's insurance recently changing to [employer] blue value. Patient advised. Patient states he will call [employer], but does understand why he needs to continue seeing Dr. Primus Bravo for back injections just to get his pain medication. Tried explaining to patient. Simona Huh is also aware of conversation with patient.

## 2015-10-29 NOTE — Telephone Encounter (Signed)
LMTCB

## 2015-10-29 NOTE — Telephone Encounter (Signed)
Recommend he discuss this with Dr. Primus Bravo. He probably will have some options or refer him to someone in his insurance network.

## 2015-10-30 ENCOUNTER — Telehealth: Payer: Self-pay | Admitting: Family Medicine

## 2015-10-30 NOTE — Telephone Encounter (Signed)
Discussed with Simona Huh. Per Simona Huh he is unable to prescribe for patient due to contract with pain clinic. Advised patient. Patient states he is out of medication early due to pain from back injections. Advised patient to contact pain clinic. Patient states that Dr. Primus Bravo is not in office today.

## 2015-10-30 NOTE — Telephone Encounter (Signed)
Pt contacted office for refill request on the following medications: HYDROcodone-acetaminophen (NORCO) 10-325 MG tablet.   Pt stated that he thinks he might have found a pain doctor that will take his insurance but they are closed on Fridays and he is out of the medication. Pt stated that his last RX for this medication was written for 180 pills on 10/06/15 by Dr. Primus Bravo and that he is out of the medication. Pt is requesting Simona Huh to write the RX for at least 10 pills to help until he can get into see the other doctor. Please advise. Thanks TNP

## 2015-11-02 ENCOUNTER — Telehealth: Payer: Self-pay | Admitting: Pain Medicine

## 2015-11-02 NOTE — Telephone Encounter (Signed)
Joshua Acevedo and Nurses  Patient needs to come today as he stated he would do Nurses Please see if  the patient is due for medication refill before patient comes

## 2015-11-02 NOTE — Telephone Encounter (Signed)
Patient states that he is out of his medicine and needs appointment asap  He has an appointment schedule for 11/17/2015  Please call

## 2015-11-02 NOTE — Telephone Encounter (Signed)
Has run out of meds  Could please write script until he gets ins cleared up

## 2015-11-03 ENCOUNTER — Ambulatory Visit: Payer: BLUE CROSS/BLUE SHIELD | Attending: Pain Medicine | Admitting: Pain Medicine

## 2015-11-03 ENCOUNTER — Encounter: Payer: Self-pay | Admitting: Pain Medicine

## 2015-11-03 ENCOUNTER — Other Ambulatory Visit: Payer: Self-pay | Admitting: Family Medicine

## 2015-11-03 VITALS — BP 87/40 | HR 74 | Temp 95.7°F | Ht 66.0 in | Wt 150.0 lb

## 2015-11-03 DIAGNOSIS — M5136 Other intervertebral disc degeneration, lumbar region: Secondary | ICD-10-CM

## 2015-11-03 DIAGNOSIS — M329 Systemic lupus erythematosus, unspecified: Secondary | ICD-10-CM | POA: Diagnosis not present

## 2015-11-03 DIAGNOSIS — R7303 Prediabetes: Secondary | ICD-10-CM | POA: Diagnosis not present

## 2015-11-03 DIAGNOSIS — I7300 Raynaud's syndrome without gangrene: Secondary | ICD-10-CM | POA: Diagnosis not present

## 2015-11-03 DIAGNOSIS — M542 Cervicalgia: Secondary | ICD-10-CM | POA: Diagnosis present

## 2015-11-03 DIAGNOSIS — M546 Pain in thoracic spine: Secondary | ICD-10-CM | POA: Diagnosis present

## 2015-11-03 DIAGNOSIS — M47816 Spondylosis without myelopathy or radiculopathy, lumbar region: Secondary | ICD-10-CM

## 2015-11-03 DIAGNOSIS — M797 Fibromyalgia: Secondary | ICD-10-CM

## 2015-11-03 DIAGNOSIS — M533 Sacrococcygeal disorders, not elsewhere classified: Secondary | ICD-10-CM | POA: Diagnosis not present

## 2015-11-03 DIAGNOSIS — B1920 Unspecified viral hepatitis C without hepatic coma: Secondary | ICD-10-CM | POA: Insufficient documentation

## 2015-11-03 DIAGNOSIS — M1990 Unspecified osteoarthritis, unspecified site: Secondary | ICD-10-CM | POA: Diagnosis not present

## 2015-11-03 DIAGNOSIS — M51369 Other intervertebral disc degeneration, lumbar region without mention of lumbar back pain or lower extremity pain: Secondary | ICD-10-CM

## 2015-11-03 MED ORDER — HYDROCODONE-ACETAMINOPHEN 10-325 MG PO TABS: ORAL_TABLET | ORAL | Status: DC

## 2015-11-03 MED ORDER — GABAPENTIN 300 MG PO CAPS: ORAL_CAPSULE | ORAL | Status: DC

## 2015-11-03 NOTE — Patient Instructions (Addendum)
  Continue present medication hydrocodone acetaminophen and  Neurontin. Caution Neurontin can cause respiratory depression, excessive sedation, confusion, and other undesirable side effects   F/U PCP D C Chrissmon for evaliation of  BP and general medical  condition  F/U surgical evaluation. May consider pending follow-up evaluations  F/U neurological evaluation. May consider pending follow-up evaluations  May consider radiofrequency rhizolysis or intraspinal procedures pending response to present treatment and F/U evaluation   Call pain management prior to return appointment if you have concerns regarding condition

## 2015-11-03 NOTE — Progress Notes (Signed)
   Subjective:    Patient ID: Joshua Acevedo, male    DOB: [birth year], 55 y.o.   MRN: [id number]  HPI Patient sits 55-year-old gentleman who returns to pain management for further evaluation and treatment of pain involving neck entire back upper and lower extremity regions with predominant pain involving the lower back and lower extremity regions. The patient missed improvement of pain with the addition of Neurontin to his treatment regimen. The patient also has had improvement of prior interventional treatment. At the present time we'll avoid additional interventional treatment and will adjust medications as discussed and explained to patient on today's visit. She continues to work. The patient has been cautioned regarding the Neurontin which can cause excessive sedation confusion respiratory depression in addition to the hydrocodone acetaminophen. We have advised patient to avoid taking these medications if he is planning to work and notices that these medications caused side effects. We will continue to modify treatment regimen as discussed and we'll remain available to consider patient for additional evaluations as discussed and as well treatment plan   Review of Systems     Objective:   Physical Exam There was tenderness to palpation of paraspinal must reason cervical region cervical facet region palpation which reproduces pain of mild degree with mild tinnitus of the splenius capitis and occipitalis musculature region. Patient appeared to be with slightly decreased grip strength and Tinel and Phalen's maneuver were without increased pain of significant degree. Palpation over the region of the acromioclavicular and glenohumeral joint regions reproduce moderate discomfort. There was tends to palpation thoracic facet thoracic paraspinal musculature region without crepitus of the thoracic region noted. Palpation over the lumbar paraspinal musculatures and lumbar facet region was with moderate tends to  palpation with lateral bending rotation extension and palpation of the lumbar facets reproducing moderate discomfort. There was moderate tenderness to palpation of the PSIS and PII S regions as well as gluteal and piriformis musculature regions. Straight leg raising was tolerates approximately 20 without increased pain with dorsiflexion noted. There was negative clonus negative Homans. Abdomen was nontender with no costovertebral angle tenderness noted.       Assessment & Plan:    Sacroiliac joint dysfunction  Lumbar facet syndrome  Systemic lupus erythematosus  Raynaud's syndrome  Fibromyalgia  Osteoarthritis  Hepatitis C  Borderline diabetes     PLAN   Continue present medication hydrocodone acetaminophen and  Neurontin. Caution Neurontin can cause respiratory depression, excessive sedation, confusion, and other undesirable side effects   F/U PCP D C Chrissmon for evaliation of  BP and general medical  condition  F/U surgical evaluation. May consider pending follow-up evaluations  F/U neurological evaluation. May consider pending follow-up evaluations  May consider radiofrequency rhizolysis or intraspinal procedures pending response to present treatment and F/U evaluation   Call pain management prior to return appointment if you have concerns regarding condition

## 2015-11-03 NOTE — Telephone Encounter (Signed)
Pt returned your call.  Please call [phone].  Thanks Con Memos

## 2015-11-03 NOTE — Progress Notes (Signed)
Safety precautions to be maintained throughout the outpatient stay will include: orient to surroundings, keep bed in low position, maintain call bell within reach at all times, provide assistance with transfer out of bed and ambulation.  

## 2015-11-09 ENCOUNTER — Other Ambulatory Visit: Payer: Self-pay | Admitting: Family Medicine

## 2015-11-09 NOTE — Telephone Encounter (Signed)
Agree that patient should continue follow up with Dr. Primus Bravo and follow his contract instructions or schedule referral to a physiatrist like Dr. Sharlet Salina if Dr. Primus Bravo agrees.

## 2015-11-10 NOTE — Telephone Encounter (Signed)
Dennis patient  Last OV: 08/10/2015  Last refill: 08/04/2015

## 2015-11-17 ENCOUNTER — Ambulatory Visit: Payer: 59 | Admitting: Pain Medicine

## 2015-11-23 ENCOUNTER — Other Ambulatory Visit: Payer: Self-pay | Admitting: Family Medicine

## 2015-11-24 NOTE — Telephone Encounter (Signed)
May advise patient refill prescription of ready for pick up if Dr. Primus Bravo is OK with continuation of the Alprazolam for anxiety disorder. Prescription left at the front desk file.

## 2015-12-01 ENCOUNTER — Other Ambulatory Visit: Payer: Self-pay | Admitting: Family Medicine

## 2015-12-01 NOTE — Telephone Encounter (Signed)
Pt contacted office for refill request on the following medications:  HYDROcodone-acetaminophen (NORCO) 10-325 MG tablet.  CB#[phone]/MW  Pt is completely out and would like to pick this up today/MW

## 2015-12-01 NOTE — Telephone Encounter (Addendum)
Patient advised per Simona Huh he can not refill medication due to contract with pain clinic. Patient states that [facility] pain clinic no longer accepts his insurance and he is going to a [facility] pain clinic.   Advised patient per Simona Huh and Dr. Caryn Section he would need to contact Dr. Primus Bravo office to see if he can refill medication until patient sees provider at [facility], since he has a pain clinic contract.   It appears patient has a appointment with Dr. Primus Bravo on 12/03/2015 for medication refill.

## 2015-12-01 NOTE — Telephone Encounter (Signed)
Please review. Thanks!  

## 2015-12-03 ENCOUNTER — Ambulatory Visit: Payer: BLUE CROSS/BLUE SHIELD | Attending: Pain Medicine | Admitting: Pain Medicine

## 2015-12-03 ENCOUNTER — Encounter: Payer: Self-pay | Admitting: Pain Medicine

## 2015-12-03 VITALS — BP 123/54 | HR 90 | Temp 97.5°F | Resp 14 | Ht 66.0 in | Wt 158.0 lb

## 2015-12-03 DIAGNOSIS — M1990 Unspecified osteoarthritis, unspecified site: Secondary | ICD-10-CM | POA: Diagnosis not present

## 2015-12-03 DIAGNOSIS — M329 Systemic lupus erythematosus, unspecified: Secondary | ICD-10-CM | POA: Insufficient documentation

## 2015-12-03 DIAGNOSIS — R7303 Prediabetes: Secondary | ICD-10-CM | POA: Insufficient documentation

## 2015-12-03 DIAGNOSIS — M533 Sacrococcygeal disorders, not elsewhere classified: Secondary | ICD-10-CM | POA: Diagnosis not present

## 2015-12-03 DIAGNOSIS — I7300 Raynaud's syndrome without gangrene: Secondary | ICD-10-CM | POA: Diagnosis not present

## 2015-12-03 DIAGNOSIS — M5136 Other intervertebral disc degeneration, lumbar region: Secondary | ICD-10-CM

## 2015-12-03 DIAGNOSIS — B1920 Unspecified viral hepatitis C without hepatic coma: Secondary | ICD-10-CM | POA: Insufficient documentation

## 2015-12-03 DIAGNOSIS — M797 Fibromyalgia: Secondary | ICD-10-CM

## 2015-12-03 DIAGNOSIS — M79606 Pain in leg, unspecified: Secondary | ICD-10-CM | POA: Diagnosis present

## 2015-12-03 DIAGNOSIS — M545 Low back pain: Secondary | ICD-10-CM | POA: Diagnosis present

## 2015-12-03 DIAGNOSIS — M5416 Radiculopathy, lumbar region: Secondary | ICD-10-CM

## 2015-12-03 DIAGNOSIS — M47816 Spondylosis without myelopathy or radiculopathy, lumbar region: Secondary | ICD-10-CM

## 2015-12-03 DIAGNOSIS — M51369 Other intervertebral disc degeneration, lumbar region without mention of lumbar back pain or lower extremity pain: Secondary | ICD-10-CM

## 2015-12-03 MED ORDER — GABAPENTIN 300 MG PO CAPS: ORAL_CAPSULE | ORAL | Status: DC

## 2015-12-03 MED ORDER — HYDROCODONE-ACETAMINOPHEN 10-325 MG PO TABS: ORAL_TABLET | ORAL | Status: DC

## 2015-12-03 NOTE — Progress Notes (Signed)
Subjective:    Patient ID: Joshua Acevedo, male    DOB: [birth year], 55 y.o.   MRN: [id number]  HPI  The patient is a 55 year old gentleman who returns to pain management Center for further evaluation and treatment of pain involving the lower back and lower extremity region. The patient states that the previous procedure right him significant relief of pain.. The patient was with significant relief of pain following block of nerves to the sacroiliac joint. The patient continues noted medications as prescribed consisting of Neurontin and hydrocodone acetaminophen. We discussed patient's condition and wishes to have patient proceed with lumbar MRI as previously ordered. We we'll have patient proceed with lumbar MRI and May consider modifications of treatment pending results of lumbar MRI and further assessment of patient's condition as discussed and as explained to patient on today's visit. The patient denied any trauma change in events of daily living the call significant change in symptomatology. The patient continues to work and states that the pain does interfere with activities of daily living to significant degree and that the interventional treatment and medications have been significantly in terms of decreasing the severity of his pain. We will proceed with lumbar MRI and will consider patient for additional modifications of treatment regimen pending results of studies and follow-up evaluation in pain management Center. The patient is understanding and agreed to suggested treatment plan         Review of Systems     Objective:   Physical Exam  There was tends to palpation of paraspinal misreading the cervical region cervical facet region of minimal degree with minimal tenderness of the splenius capitis and occipitalis musculature regions. There was tenderness of the acromial clavicular and glenohumeral joint regions of minimal degree and patient appeared to be with bilaterally equal grip  strength with Tinel and Phalen's maneuver reproducing minimal discomfort. The patient was with unremarkable Spurling's maneuver as well. Palpation over the thoracic facet thoracic paraspinal must reason was minimal tenderness to palpation as well as palpation over the cervical facet cervical paraspinal musculature region. Palpation over the lumbar paraspinal must reason lumbar facet region was with moderate tends to palpation with moderate tenderness to palpation of the left and right lumbar paraspinal musculature regions and without increased pain with lateral bending rotation and extension and palpation of the lumbar facets of moderate degree. There was moderate to moderately severe tenderness of the PSIS and PII S region as well as the gluteal and piriformis musculature regions. There was mild increased pain with pressure applied to the ileum with patient in lateral decubitus position. Straight leg raising was tolerates approximately 30 without increased pain with dorsiflexion noted. DTRs appeared to be trace at the knees. EHL strength appeared to be equal there was negative clonus negative Homans no sensory deficit or dermatomal dystrophy detected. Nontender with no costovertebral tenderness noted       Assessment & Plan:      Sacroiliac joint dysfunction  Lumbar facet syndrome  Systemic lupus erythematosus  Raynaud's syndrome  Fibromyalgia  Osteoarthritis  Hepatitis C  Borderline diabetes     PLAN   Continue present medication hydrocodone acetaminophen and  Neurontin. Caution Neurontin can cause respiratory depression, excessive sedation, confusion, and other undesirable side effects   F/U PCP D C Chrissmon for evaliation of  BP and general medical  condition  F/U surgical evaluation. May consider pending follow-up evaluations. We will avoid at this time  Ask the nurses and secretaries the date of your lumbar  MRI  F/U neurological evaluation. May consider PNCV/EMG studies  and other studies pending follow-up evaluations  May consider radiofrequency rhizolysis or intraspinal procedures pending response to present treatment and F/U evaluation   Call pain management prior to return appointment if you have concerns regarding condition

## 2015-12-03 NOTE — Progress Notes (Signed)
Safety precautions to be maintained throughout the outpatient stay will include: orient to surroundings, keep bed in low position, maintain call bell within reach at all times, provide assistance with transfer out of bed and ambulation.  

## 2015-12-03 NOTE — Patient Instructions (Addendum)
PLAN   Continue present medication hydrocodone acetaminophen and  Neurontin. Caution Neurontin can cause respiratory depression, excessive sedation, confusion, and other undesirable side effects   F/U PCP D C Chrissmon for evaliation of  BP and general medical  condition  F/U surgical evaluation. May consider pending follow-up evaluations. We will avoid at this time  Ask the nurses and secretaries the date of your lumbar MRI  F/U neurological evaluation. May consider PNCV/EMG studies and other studies pending follow-up evaluations  May consider radiofrequency rhizolysis or intraspinal procedures pending response to present treatment and F/U evaluation   Call pain management prior to return appointment if you have concerns regarding condition

## 2015-12-31 ENCOUNTER — Ambulatory Visit: Payer: BLUE CROSS/BLUE SHIELD | Attending: Pain Medicine | Admitting: Pain Medicine

## 2015-12-31 ENCOUNTER — Encounter: Payer: Self-pay | Admitting: Pain Medicine

## 2015-12-31 VITALS — BP 131/70 | HR 81 | Temp 96.6°F | Resp 15 | Ht 66.0 in | Wt 162.0 lb

## 2015-12-31 DIAGNOSIS — I7300 Raynaud's syndrome without gangrene: Secondary | ICD-10-CM

## 2015-12-31 DIAGNOSIS — M533 Sacrococcygeal disorders, not elsewhere classified: Secondary | ICD-10-CM

## 2015-12-31 DIAGNOSIS — M329 Systemic lupus erythematosus, unspecified: Secondary | ICD-10-CM | POA: Diagnosis not present

## 2015-12-31 DIAGNOSIS — M1990 Unspecified osteoarthritis, unspecified site: Secondary | ICD-10-CM | POA: Insufficient documentation

## 2015-12-31 DIAGNOSIS — R7303 Prediabetes: Secondary | ICD-10-CM | POA: Insufficient documentation

## 2015-12-31 DIAGNOSIS — M5136 Other intervertebral disc degeneration, lumbar region: Secondary | ICD-10-CM

## 2015-12-31 DIAGNOSIS — M79606 Pain in leg, unspecified: Secondary | ICD-10-CM | POA: Diagnosis present

## 2015-12-31 DIAGNOSIS — B1920 Unspecified viral hepatitis C without hepatic coma: Secondary | ICD-10-CM | POA: Diagnosis not present

## 2015-12-31 DIAGNOSIS — M47816 Spondylosis without myelopathy or radiculopathy, lumbar region: Secondary | ICD-10-CM

## 2015-12-31 DIAGNOSIS — M797 Fibromyalgia: Secondary | ICD-10-CM | POA: Insufficient documentation

## 2015-12-31 DIAGNOSIS — M545 Low back pain: Secondary | ICD-10-CM | POA: Diagnosis present

## 2015-12-31 DIAGNOSIS — M5416 Radiculopathy, lumbar region: Secondary | ICD-10-CM

## 2015-12-31 MED ORDER — HYDROCODONE-ACETAMINOPHEN 10-325 MG PO TABS: ORAL_TABLET | ORAL | Status: DC

## 2015-12-31 MED ORDER — GABAPENTIN 300 MG PO CAPS: ORAL_CAPSULE | ORAL | Status: DC

## 2015-12-31 NOTE — Patient Instructions (Signed)
PLAN   Continue present medication hydrocodone acetaminophen and  Neurontin. Caution Neurontin can cause respiratory depression, excessive sedation, confusion, and other undesirable side effects   F/U PCP  C Chrissmon for evaliation of  BP and general medical  condition  F/U surgical evaluation. May consider pending follow-up evaluations. We will avoid at this time  Lumbar MRI to be obtained. Please ask the secretary and nurses date of your lumbar MRI  F/U neurological evaluation. May consider PNCV/EMG studies and other studies pending follow-up evaluations  May consider radiofrequency rhizolysis or intraspinal procedures pending response to present treatment and F/U evaluation   Call pain management prior to return appointment if you have concerns regarding condition

## 2015-12-31 NOTE — Progress Notes (Signed)
   Subjective:    Patient ID: Joshua Acevedo, male    DOB: [birth year], 55 y.o.   MRN: [id number]  HPI  The patient is a 55 year old gentleman who returns to pain management for further evaluation and treatment of pain involving the mid lower back and lower extremity region. The patient states that he continues to have significant lower back and lower extremity pain. The patient is to undergo lumbar MRI further assessment of his symptoms. Lumbar MRI has been scheduled and we will consider modification of treatment regimen pending review of lumbar MRI results and follow-up evaluation the patient. The patient denied any definite trauma change in events of daily living the call significant change in symptomatology. The patient continues to work and continues medications Neurontin and hydrocodone acetaminophen. We will remain available to modified treatment regimen as discussed pending further assessment of patient's condition. We have also discussed neurosurgical evaluation in addition to review of results of lumbar MRI and may consider patient for further evaluations as well. All agreed to suggested treatment plan  Review of Systems     Objective:   Physical Exam  There was tenderness of the splenius capitis and occipitalis musculature regions of mild degree. Palpation of the cervical facet cervical paraspinal musculature region reproduced mild discomfort. The patient appeared to be with mild to moderate tenderness of the acromioclavicular and glenohumeral joint regions and was able to perform drop test without significant difficulty. Tinel and Phalen's maneuver were without increase of pain of significant degree and patient appeared to be with bilaterally equal grip strength. Palpation over the thoracic region thoracic facet region was attends to palpation of moderate degree in the lower thoracic region with moderate muscle spasm noted in the lower thoracic region. There was no crepitus of the thoracic  region noted. Palpation over the lumbar paraspinal must reason lumbar facet region was attends to palpation with moderate to moderately severe tenderness to palpation with lateral bending rotation extension and palpation over the lumbar facets. Straight leg raising was tolerates approximately 30 without increase of pain with dorsiflexion noted. The patient was able to stand on tiptoes and heels with mild difficulty. Palpation of the PSIS and PII S regions reproduce moderate discomfort. There was moderate tenderness on the left and right regions. Palpation of the greater trochanteric region and iliotibial band region was with mild discomfort. No definite sensory deficit of the lower extremities noted. There was negative clonus negative Homans. No costovertebral tenderness was noted      Assessment & Plan:      Sacroiliac joint dysfunction  Lumbar facet syndrome  Systemic lupus erythematosus  Raynaud's syndrome  Fibromyalgia  Osteoarthritis  Hepatitis C  Borderline diabetes mellitus    PLAN   Continue present medication hydrocodone acetaminophen and  Neurontin. Caution Neurontin can cause respiratory depression, excessive sedation, confusion, and other undesirable side effects   F/U PCP  C Chrissmon for evaliation of  BP and general medical  condition  F/U surgical evaluation. May consider pending follow-up evaluations. We will avoid at this time  Lumbar MRI to be obtained. Please ask the secretary and nurses date of your lumbar MRI  F/U neurological evaluation. May consider PNCV/EMG studies and other studies pending follow-up evaluations  May consider radiofrequency rhizolysis or intraspinal procedures pending response to present treatment and F/U evaluation   Call pain management prior to return appointment if you have concerns regarding condition

## 2015-12-31 NOTE — Progress Notes (Signed)
Safety precautions to be maintained throughout the outpatient stay will include: orient to surroundings, keep bed in low position, maintain call bell within reach at all times, provide assistance with transfer out of bed and ambulation.  

## 2016-01-07 ENCOUNTER — Ambulatory Visit: Payer: BLUE CROSS/BLUE SHIELD

## 2016-01-16 ENCOUNTER — Other Ambulatory Visit: Payer: Self-pay | Admitting: Family Medicine

## 2016-01-18 ENCOUNTER — Other Ambulatory Visit: Payer: Self-pay | Admitting: Family Medicine

## 2016-01-18 NOTE — Telephone Encounter (Signed)
Advise patient refill of Alprazolam printed out for pick up. He should schedule follow up appointment for cholesterol and anxiety disorder.

## 2016-01-27 ENCOUNTER — Encounter: Payer: Self-pay | Admitting: Pain Medicine

## 2016-01-27 ENCOUNTER — Ambulatory Visit: Payer: BLUE CROSS/BLUE SHIELD | Attending: Pain Medicine | Admitting: Pain Medicine

## 2016-01-27 VITALS — BP 178/106 | HR 77 | Temp 97.7°F | Resp 16 | Ht 66.0 in | Wt 152.0 lb

## 2016-01-27 DIAGNOSIS — M329 Systemic lupus erythematosus, unspecified: Secondary | ICD-10-CM | POA: Insufficient documentation

## 2016-01-27 DIAGNOSIS — I7300 Raynaud's syndrome without gangrene: Secondary | ICD-10-CM | POA: Insufficient documentation

## 2016-01-27 DIAGNOSIS — M79606 Pain in leg, unspecified: Secondary | ICD-10-CM | POA: Diagnosis present

## 2016-01-27 DIAGNOSIS — M797 Fibromyalgia: Secondary | ICD-10-CM | POA: Diagnosis not present

## 2016-01-27 DIAGNOSIS — M5416 Radiculopathy, lumbar region: Secondary | ICD-10-CM

## 2016-01-27 DIAGNOSIS — B1920 Unspecified viral hepatitis C without hepatic coma: Secondary | ICD-10-CM | POA: Insufficient documentation

## 2016-01-27 DIAGNOSIS — M1990 Unspecified osteoarthritis, unspecified site: Secondary | ICD-10-CM | POA: Diagnosis not present

## 2016-01-27 DIAGNOSIS — M533 Sacrococcygeal disorders, not elsewhere classified: Secondary | ICD-10-CM | POA: Diagnosis not present

## 2016-01-27 DIAGNOSIS — M545 Low back pain: Secondary | ICD-10-CM | POA: Insufficient documentation

## 2016-01-27 DIAGNOSIS — M5136 Other intervertebral disc degeneration, lumbar region: Secondary | ICD-10-CM

## 2016-01-27 DIAGNOSIS — M47816 Spondylosis without myelopathy or radiculopathy, lumbar region: Secondary | ICD-10-CM

## 2016-01-27 MED ORDER — GABAPENTIN 100 MG PO CAPS: ORAL_CAPSULE | ORAL | Status: DC

## 2016-01-27 MED ORDER — GABAPENTIN 300 MG PO CAPS: ORAL_CAPSULE | ORAL | Status: DC

## 2016-01-27 MED ORDER — HYDROCODONE-ACETAMINOPHEN 10-325 MG PO TABS: ORAL_TABLET | ORAL | Status: DC

## 2016-01-27 NOTE — Progress Notes (Signed)
Subjective:    Patient ID: Joshua Acevedo, male    DOB: 1961-04-16, 55 y.o.   MRN: [id number]  HPI  The patient is a 55 year old gentleman who returns to pain management for further evaluation and treatment of pain involving the lower back and lower extremity region predominantly the patient was scheduled for lumbar MRI and stated that he was asked to pay $600 when he arrived for his lumbar MRI. We will reschedule patient for lumbar MRI at this time. The patient is a pain which increased with standing walking twisting turning maneuvers and becoming more intense as the day progresses. The patient continues to work and states that he has had some improvement with prior interventional treatment as well as with hydrocodone acetaminophen and Neurontin. We will continue present medications and will await results of lumbar MRI as well as consider patient for additional evaluations as discussed and as explained to patient on today's visit. The patient was understanding and in agreement with suggested treatment plan. Trauma change in events of daily living the call significant change in symptomatology.      Review of Systems     Objective:   Physical Exam  There was tenderness of the splenius capitis and occipitalis musculature regions. Palpation of these regions reproduced mild to moderate discomfort. Palpation over the cervical facet cervical paraspinal musculature region was attends to palpation of mild to moderate degree. There was tenderness over the thoracic facet thoracic paraspinal musculature region with no crepitus of the thoracic region noted. Palpation of the acromioclavicular and glenohumeral joint regions were with moderate discomfort with unremarkable Spurling's maneuver. Tinel and Phalen's maneuver were with increased pain of minimal degree and patient appeared to be with bilaterally equal grip strength. Palpation over the lumbar facet lumbar paraspinal muscular treat and was with moderate  discomfort with lateral bending rotation extension and palpation of the lumbar facets reproducing moderately severe discomfort. There was moderate to moderately severe tenderness of the PSIS and PII S region as well as the gluteal and piriformis musculature regions. There was moderate tenderness along the greater trochanteric region and iliotibial band region with increase of pain with pressure applied to the ileum with patient in lateral decubitus position no definite sensory deficit or dermatomal dystrophy detected. Straight leg raising tolerates approximately 30 without a definite increased pain with dorsiflexion noted. EHL strength appeared to be decreased and there was no definite sensory deficit or dermatomal distribution detected. There was negative clonus negative Homans. Abdomen nontender with no costovertebral tenderness noted      Assessment & Plan:     Sacroiliac joint dysfunction  Lumbar facet syndrome  Systemic lupus erythematosus  Raynaud's syndrome  Fibromyalgia  Osteoarthritis  Hepatitis C       PLAN   Continue present medication hydrocodone acetaminophen and  Neurontin.  CAUTION Neurontin (gabapentin)  can cause respiratory depression, excessive sedation, confusion, and other undesirable side effects . Neurontin is now 100 mg size  F/U PCP  C Chrissmon for evaliation of  BP and general medical  condition. Please see Dr. Jeananne Rama this week for evaluation of elevated blood pressure   F/U surgical evaluation. May consider pending follow-up evaluations. We will avoid at this time  Lumbar MRI to be obtained. Please ask the secretary and nurses date of your lumbar MRI as we previously discussed. Ask the secretary to direct you to business office to discuss obtaining your lumbar MRI as we discussed   F/U neurological evaluation. May consider PNCV/EMG studies and other studies  pending follow-up evaluations  May consider radiofrequency rhizolysis or intraspinal  procedures pending response to present treatment and F/U evaluation   Call pain management prior to return appointment if you have concerns regarding condition

## 2016-01-27 NOTE — Progress Notes (Signed)
Patient here for medication management Safety precautions to be maintained throughout the outpatient stay will include: orient to surroundings, keep bed in low position, maintain call bell within reach at all times, provide assistance with transfer out of bed and ambulation.  

## 2016-01-27 NOTE — Patient Instructions (Addendum)
PLAN   Continue present medication hydrocodone acetaminophen and  Neurontin.  CAUTION Neurontin (gabapentin)  can cause respiratory depression, excessive sedation, confusion, and other undesirable side effects . Neurontin is now 100 mg size  F/U PCP  C Chrissmon for evaliation of  BP and general medical  condition. Please see Dr. Jeananne Rama this week for evaluation of elevated blood pressure   F/U surgical evaluation. May consider pending follow-up evaluations. We will avoid at this time  Lumbar MRI to be obtained. Please ask the secretary and nurses date of your lumbar MRI as we previously discussed. Ask the secretary to direct you to business office to discuss obtaining your lumbar MRI as we discussed   F/U neurological evaluation. May consider PNCV/EMG studies and other studies pending follow-up evaluations  May consider radiofrequency rhizolysis or intraspinal procedures pending response to present treatment and F/U evaluation   Call pain management prior to return appointment if you have concerns regarding condition

## 2016-02-01 ENCOUNTER — Other Ambulatory Visit: Payer: Self-pay | Admitting: Pain Medicine

## 2016-02-02 ENCOUNTER — Other Ambulatory Visit: Payer: Self-pay | Admitting: Pain Medicine

## 2016-02-04 LAB — TOXASSURE SELECT 13 (MW), URINE: PDF: 0

## 2016-02-12 ENCOUNTER — Other Ambulatory Visit: Payer: Self-pay | Admitting: Family Medicine

## 2016-02-25 ENCOUNTER — Encounter: Payer: BLUE CROSS/BLUE SHIELD | Admitting: Pain Medicine

## 2016-02-25 ENCOUNTER — Ambulatory Visit: Payer: BLUE CROSS/BLUE SHIELD | Attending: Pain Medicine | Admitting: Pain Medicine

## 2016-02-25 VITALS — BP 146/66 | HR 81 | Temp 97.7°F | Resp 16 | Ht 66.0 in | Wt 160.0 lb

## 2016-02-25 DIAGNOSIS — B1920 Unspecified viral hepatitis C without hepatic coma: Secondary | ICD-10-CM | POA: Diagnosis not present

## 2016-02-25 DIAGNOSIS — M1990 Unspecified osteoarthritis, unspecified site: Secondary | ICD-10-CM | POA: Diagnosis not present

## 2016-02-25 DIAGNOSIS — M545 Low back pain: Secondary | ICD-10-CM | POA: Diagnosis present

## 2016-02-25 DIAGNOSIS — M5416 Radiculopathy, lumbar region: Secondary | ICD-10-CM

## 2016-02-25 DIAGNOSIS — M533 Sacrococcygeal disorders, not elsewhere classified: Secondary | ICD-10-CM | POA: Diagnosis not present

## 2016-02-25 DIAGNOSIS — I7300 Raynaud's syndrome without gangrene: Secondary | ICD-10-CM | POA: Insufficient documentation

## 2016-02-25 DIAGNOSIS — M797 Fibromyalgia: Secondary | ICD-10-CM | POA: Diagnosis not present

## 2016-02-25 DIAGNOSIS — M329 Systemic lupus erythematosus, unspecified: Secondary | ICD-10-CM | POA: Diagnosis not present

## 2016-02-25 DIAGNOSIS — M5136 Other intervertebral disc degeneration, lumbar region: Secondary | ICD-10-CM

## 2016-02-25 DIAGNOSIS — M47816 Spondylosis without myelopathy or radiculopathy, lumbar region: Secondary | ICD-10-CM

## 2016-02-25 DIAGNOSIS — M79606 Pain in leg, unspecified: Secondary | ICD-10-CM | POA: Diagnosis present

## 2016-02-25 MED ORDER — GABAPENTIN 100 MG PO CAPS: ORAL_CAPSULE | ORAL | Status: DC

## 2016-02-25 MED ORDER — HYDROCODONE-ACETAMINOPHEN 10-325 MG PO TABS: ORAL_TABLET | ORAL | Status: DC

## 2016-02-25 MED ORDER — OXYCODONE HCL 5 MG PO TABS: ORAL_TABLET | ORAL | Status: DC

## 2016-02-25 NOTE — Patient Instructions (Addendum)
PLAN   Continue present medication  Neurontin STOP HYDROCODONE ACETAMINOPHEN   and BEGIN OXYCODONE  CAUTION Neurontin (gabapentin) and oxycodone  can cause respiratory depression, excessive sedation, confusion, and other undesirable side effects . Neurontin is now 100 mg size  F/U PCP  C Chrissmon for evaliation of  BP and general medical  condition.   F/U surgical evaluation. May consider pending follow-up evaluations. We will avoid at this time  Lumbar MRI to be obtained. Please ask the secretary and nurses date of your lumbar MRI as we previously discussed. Ask the secretary to direct you to business office to discuss obtaining your lumbar MRI as we discussed   F/U neurological evaluation. May consider PNCV/EMG studies and other studies pending follow-up evaluations  May consider radiofrequency rhizolysis or intraspinal procedures pending response to present treatment and F/U evaluation   Call pain management prior to return appointment if you have concerns regarding condition

## 2016-02-25 NOTE — Progress Notes (Signed)
Safety precautions to be maintained throughout the outpatient stay will include: orient to surroundings, keep bed in low position, maintain call bell within reach at all times, provide assistance with transfer out of bed and ambulation.  

## 2016-02-26 NOTE — Progress Notes (Signed)
Subjective:    Patient ID: Joshua Acevedo, male    DOB: 1961/09/23, 55 y.o.   MRN: [id number]  HPI  The patient is a 55 year old gentleman who returns to pain management for further evaluation and treatment of pain involving the region of the lower back and lower extremity region predominantly the patient continues to work and states that the pain is aggravated by activity as on the feet. Patient states the pain is severely disabling pain that occurs after patient has been significant pain on the feet. The patient denies any recent trauma change in events of daily living the call significant change in symptomatology. The patient's pain is aggravated by activities such as twisting turning and extension. The patient states that he has decreased symptoms when he is taking his medication and that interventional treatment has been of significant benefit as well. We will continue present medication Neurontin and replace hydrocodone acetaminophen oxycodone with out acetaminophen in an attempt to decrease patient's acetaminophen consumption as discussed We will remain available to proceed with interventional treatment as discussed and patient will call pain management for any change in condition prior to scheduled return appointment suggested treatment plan    Review of Systems     Objective:   Physical Exam  There was tends to palpation of the paraspinal musculature region of the cervical region cervical facet region of mild to moderate degree with mild to moderate tenderness of the splenius capitis and occipitalis musculature regions. There was tends to palpation of the acromioclavicular and glenohumeral joint regions of moderate degree and patient was with unremarkable Spurling's maneuver. Tinel and Phalen's maneuver reproduced minimal discomfort and patient appeared to be with bilaterally equal grip strength. Palpation over the region of the thoracic facet thoracic paraspinal musculature region was  with no crepitus of the thoracic region with evidence of mild to moderate tenderness to palpation noted. Palpation over the region of the lumbar paraspinal muscular treat and lumbar facet region was attends to palpation of moderate to moderately severe degree with lateral bending rotation extension and palpation of the lumbar facets reproducing moderate to moderately severe discomfort. Palpation of the PSIS and PII S region reproduced moderate discomfort. There was mild tenderness of the greater trochanteric region iliotibial band region. No sensory deficit dermatomal dystrophy the lower extremity is noted. DTRs appeared to be trace at the knees. No strength was slightly decreased. There was negative clonus negative Homans. Abdomen nontender with no costovertebral tenderness noted      Assessment & Plan:    Sacroiliac joint dysfunction  Lumbar facet syndrome  Systemic lupus erythematosus  Raynaud's syndrome  Fibromyalgia  Osteoarthritis  Hepatitis C       PLAN   Continue present medication  Neurontin STOP HYDROCODONE ACETAMINOPHEN   and BEGIN OXYCODONE  CAUTION Neurontin (gabapentin) and oxycodone  can cause respiratory depression, excessive sedation, confusion, and other undesirable side effects . Neurontin is now 100 mg size  F/U PCP  C Chrissmon for evaliation of  BP and general medical  condition.   F/U surgical evaluation. May consider pending follow-up evaluations. We will avoid at this time  Lumbar MRI to be obtained. Please ask the secretary and nurses date of your lumbar MRI as we previously discussed. Ask the secretary to direct you to business office to discuss obtaining your lumbar MRI as we discussed   F/U neurological evaluation. May consider PNCV/EMG studies and other studies pending follow-up evaluations  May consider radiofrequency rhizolysis or intraspinal procedures pending response to  present treatment and F/U evaluation   Call pain management prior to  return appointment if you have concerns regarding condition

## 2016-03-02 ENCOUNTER — Telehealth: Payer: Self-pay

## 2016-03-02 NOTE — Telephone Encounter (Signed)
Dr. Primus Bravo took pt off hydrocodine after pt was using it for 6 years. Dr. Primus Bravo gave pt oxycodine 5 ml now pt is having with drawls. Please call pt with a plan

## 2016-03-02 NOTE — Telephone Encounter (Signed)
Nurses Please call patient and discussed medications with me. Please schedule patient for Monday, May 15th 2070

## 2016-03-02 NOTE — Telephone Encounter (Signed)
Attempted to call patient, voicemail is not set up.

## 2016-03-02 NOTE — Telephone Encounter (Signed)
Please add Joshua Acevedo to the schedule for Monday, May 15 at 0700.

## 2016-03-02 NOTE — Telephone Encounter (Signed)
Thank you :)

## 2016-03-02 NOTE — Telephone Encounter (Signed)
Pt has been using hydrocodine for 6 yrs and Dr. Primus Bravo took him off now pt is having with drawls. Pt is having pain. Dr. Primus Bravo gave him oxycodine instead

## 2016-03-02 NOTE — Telephone Encounter (Signed)
Patient states he is taking Oxycodone 4 per day. Insists he is having withdrawals, with the symptoms of vomiting and increased pain. Advised to go to ED.Patient agrees to come in Monday, May 15 at 0700.

## 2016-03-07 ENCOUNTER — Encounter: Payer: BLUE CROSS/BLUE SHIELD | Admitting: Pain Medicine

## 2016-03-21 ENCOUNTER — Other Ambulatory Visit: Payer: Self-pay | Admitting: Family Medicine

## 2016-03-22 ENCOUNTER — Ambulatory Visit: Payer: BLUE CROSS/BLUE SHIELD | Admitting: Pain Medicine

## 2016-03-22 NOTE — Telephone Encounter (Signed)
Pt wife wanting to get a refill for her husband for his  ALPRAZolam Duanne Moron) 0.5 MG tablet  He was suppose to see Simona Huh before the refill.  She made him an appt for next Monday when [employer] back.  [facility]  Wife's call back is [phone]  Thank sTeri

## 2016-03-23 ENCOUNTER — Ambulatory Visit (INDEPENDENT_AMBULATORY_CARE_PROVIDER_SITE_OTHER): Payer: BLUE CROSS/BLUE SHIELD | Admitting: Family Medicine

## 2016-03-23 ENCOUNTER — Encounter: Payer: Self-pay | Admitting: Family Medicine

## 2016-03-23 VITALS — BP 144/88 | HR 80 | Temp 98.1°F | Resp 16 | Wt 153.0 lb

## 2016-03-23 DIAGNOSIS — E559 Vitamin D deficiency, unspecified: Secondary | ICD-10-CM

## 2016-03-23 DIAGNOSIS — I7300 Raynaud's syndrome without gangrene: Secondary | ICD-10-CM | POA: Diagnosis not present

## 2016-03-23 DIAGNOSIS — E7800 Pure hypercholesterolemia, unspecified: Secondary | ICD-10-CM | POA: Diagnosis not present

## 2016-03-23 DIAGNOSIS — R208 Other disturbances of skin sensation: Secondary | ICD-10-CM | POA: Diagnosis not present

## 2016-03-23 DIAGNOSIS — R200 Anesthesia of skin: Secondary | ICD-10-CM

## 2016-03-23 MED ORDER — ALPRAZOLAM 0.5 MG PO TABS: 0.5000 mg | ORAL_TABLET | Freq: Three times a day (TID) | ORAL | Status: DC | PRN

## 2016-03-23 MED ORDER — NIFEDIPINE ER OSMOTIC RELEASE 30 MG PO TB24: 30.0000 mg | ORAL_TABLET | Freq: Every day | ORAL | Status: DC

## 2016-03-23 NOTE — Telephone Encounter (Signed)
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

## 2016-03-23 NOTE — Progress Notes (Signed)
Patient: Joshua Acevedo Male    DOB: [birth year]   55 y.o.   MRN: [id number] Visit Date: 03/23/2016  Today's Provider: Lelon Huh, MD   Chief Complaint  Patient presents with  . Circulatory Problem   Subjective:    HPI  Patient stated that he woke up this morning and his feet were numb. Both feet were cold and colorless, and felt numb. Color has gradually come back. Have not been painful. 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   He has anxiety and is aoute of alprazolam which he has bee taking 2-3 times a day for seveal year, and is working well.   He is also due for follow up lipids. Is tolerating atorvastatin well.  Lab Results  Component Value Date   CHOL 266* 10/06/2015   HDL 33* 10/06/2015   LDLCALC 215* 10/06/2015   TRIG 90 10/06/2015   CHOLHDL 8.1* 10/06/2015         No Known Allergies Previous Medications   ALPRAZOLAM (XANAX) 0.5 MG TABLET    TAKE 1 TABLET BY MOUTH THREE TIMES DAILY AS NEEDED ( MD'S OFFICE REQUEST YOU TO MAKE APPT.)   ATORVASTATIN (LIPITOR) 40 MG TABLET    Take 1 tablet (40 mg total) by mouth daily.   CHOLECALCIFEROL (VITAMIN D) 1000 UNITS TABLET    Take 3 tablets by mouth daily.    CYCLOBENZAPRINE (FLEXERIL) 10 MG TABLET    TAKE 1 TABLET TWICE DAILY AS NEEDED   FLUTICASONE (FLONASE) 50 MCG/ACT NASAL SPRAY    Place 2 sprays into both nostrils daily.   GABAPENTIN (NEURONTIN) 100 MG CAPSULE    Limit 2 - 3 capsules by mouth 2 - 3 times per day if tolerated NOTE Neurontin (gabapentin) is now 100 mg size   NIFEDIPINE (PROCARDIA-XL/ADALAT-CC/NIFEDICAL-XL) 30 MG 24 HR TABLET    TAKE 1 TABLET DAILY   OMEPRAZOLE 20 MG TBEC    Take 2 tablets by mouth daily.    Review of Systems  Constitutional: Negative for fever, chills and appetite change.  Respiratory: Negative for chest tightness, shortness of breath and wheezing.   Cardiovascular: Negative for chest pain and palpitations.    Gastrointestinal: Negative for nausea, vomiting and abdominal pain.    Social History  Substance Use Topics  . Smoking status: Current Some Day Smoker -- 0.33 packs/day for 40 years    Types: Cigarettes    Last Attempt to Quit: 08/19/2015  . Smokeless tobacco: Never Used  . Alcohol Use: No     Comment: formerly was a heavy user   Objective:   BP 144/88 mmHg  Pulse 80  Temp(Src) 98.1 F (36.7 C) (Oral)  Resp 16  Wt 153 lb (69.4 kg)  SpO2 99%  Physical Exam   General Appearance:    Alert, cooperative, no distress  Eyes:    PERRL, conjunctiva/corneas clear, EOM's intact       Lungs:     Clear to auscultation bilaterally, respirations unlabored  Heart:    Regular rate and rhythm  Neurologic:   Awake, alert, oriented x 3. No apparent focal neurological           defect.   Ext:   Normal hair growth feet and lower legs. +2 pedal pulses. Capillary refill in toes about 3 seconds.        Assessment & Plan:     1. Numbness in feet  -  Comprehensive metabolic panel - Vitamin 123456 - VITAMIN D 25 Hydroxy (Vit-D Deficiency, Fractures) - CBC  2. Vitamin D deficiency Compliant with d replacement by patient report.  - VITAMIN D 25 Hydroxy (Vit-D Deficiency, Fractures)  3. Hypercholesteremia He is tolerating atorvastatin well with no adverse effects.   - Lipid panel - CBC  4. Raynaud's disease without gangrene Out of nifedipine. [facility]   Refill current dose of alprazolam.         Lelon Huh, MD  [facility]

## 2016-03-24 ENCOUNTER — Telehealth: Payer: Self-pay

## 2016-03-24 DIAGNOSIS — E7800 Pure hypercholesterolemia, unspecified: Secondary | ICD-10-CM

## 2016-03-24 LAB — CBC
HEMOGLOBIN: 15.1 g/dL (ref 12.6–17.7)
Hematocrit: 44.7 % (ref 37.5–51.0)
MCH: 30.1 pg (ref 26.6–33.0)
MCHC: 33.8 g/dL (ref 31.5–35.7)
MCV: 89 fL (ref 79–97)
Platelets: 259 10*3/uL (ref 150–379)
RBC: 5.01 x10E6/uL (ref 4.14–5.80)
RDW: 13.8 % (ref 12.3–15.4)
WBC: 7.4 10*3/uL (ref 3.4–10.8)

## 2016-03-24 LAB — COMPREHENSIVE METABOLIC PANEL
A/G RATIO: 1.8 (ref 1.2–2.2)
ALBUMIN: 4.8 g/dL (ref 3.5–5.5)
ALK PHOS: 104 IU/L (ref 39–117)
ALT: 34 IU/L (ref 0–44)
AST: 20 IU/L (ref 0–40)
BILIRUBIN TOTAL: 0.3 mg/dL (ref 0.0–1.2)
BUN / CREAT RATIO: 8 — AB (ref 9–20)
BUN: 8 mg/dL (ref 6–24)
CHLORIDE: 101 mmol/L (ref 96–106)
CO2: 25 mmol/L (ref 18–29)
Calcium: 10.2 mg/dL (ref 8.7–10.2)
Creatinine, Ser: 1.02 mg/dL (ref 0.76–1.27)
GFR calc Af Amer: 95 mL/min/{1.73_m2} (ref >59)
GFR calc non Af Amer: 82 mL/min/{1.73_m2} (ref >59)
GLOBULIN, TOTAL: 2.7 g/dL (ref 1.5–4.5)
Glucose: 107 mg/dL — ABNORMAL HIGH (ref 65–99)
POTASSIUM: 5.3 mmol/L — AB (ref 3.5–5.2)
SODIUM: 143 mmol/L (ref 134–144)
Total Protein: 7.5 g/dL (ref 6.0–8.5)

## 2016-03-24 LAB — LIPID PANEL
CHOLESTEROL TOTAL: 278 mg/dL — AB (ref 100–199)
Chol/HDL Ratio: 8.7 ratio units — ABNORMAL HIGH (ref 0.0–5.0)
HDL: 32 mg/dL — ABNORMAL LOW (ref >39)
LDL CALC: 224 mg/dL — AB (ref 0–99)
TRIGLYCERIDES: 108 mg/dL (ref 0–149)
VLDL CHOLESTEROL CAL: 22 mg/dL (ref 5–40)

## 2016-03-24 LAB — VITAMIN D 25 HYDROXY (VIT D DEFICIENCY, FRACTURES): VIT D 25 HYDROXY: 35.2 ng/mL (ref 30.0–100.0)

## 2016-03-24 LAB — VITAMIN B12: VITAMIN B 12: 404 pg/mL (ref 211–946)

## 2016-03-24 MED ORDER — ATORVASTATIN CALCIUM 40 MG PO TABS: 40.0000 mg | ORAL_TABLET | Freq: Every day | ORAL | Status: DC

## 2016-03-24 NOTE — Telephone Encounter (Signed)
Patient advised and verbally voiced understanding. Patient was previously taking Atorvastatin 20mg  daily. Patient agrees to increase dose to 40mg  daily. Prescription sent into the pharmacy. Patient states he will call back later to schedule his 3 month follow up with Simona Huh.

## 2016-03-24 NOTE — Telephone Encounter (Signed)
Tried calling patient, and no answer. Mailbox is full. Will try again later.

## 2016-03-24 NOTE — Telephone Encounter (Signed)
-----   Message from Birdie Sons, MD sent at 03/24/2016  7:48 AM EDT ----- Cholesterol is still very high. All other labs normal. Increase atorvastatin to 40mg  daily, #30, rf x 4. Follow up Simona Huh in 3 months.

## 2016-03-25 ENCOUNTER — Telehealth: Payer: Self-pay | Admitting: Pain Medicine

## 2016-03-25 NOTE — Telephone Encounter (Signed)
Patient's boss is out of town next week and he has to work, canceled appt for 6-5 and rescheduled to 6-19

## 2016-03-25 NOTE — Telephone Encounter (Signed)
Thank you :)

## 2016-03-28 ENCOUNTER — Ambulatory Visit: Payer: Self-pay | Admitting: Family Medicine

## 2016-03-28 ENCOUNTER — Encounter: Payer: BLUE CROSS/BLUE SHIELD | Admitting: Pain Medicine

## 2016-04-11 ENCOUNTER — Encounter: Payer: BLUE CROSS/BLUE SHIELD | Admitting: Pain Medicine

## 2016-07-05 ENCOUNTER — Other Ambulatory Visit: Payer: Self-pay | Admitting: Family Medicine

## 2016-07-05 MED ORDER — CYCLOBENZAPRINE HCL 10 MG PO TABS: 10.0000 mg | ORAL_TABLET | Freq: Two times a day (BID) | ORAL | 2 refills | Status: DC | PRN

## 2016-07-05 NOTE — Telephone Encounter (Signed)
Pt's wife contacted office for refill request on the following medications: cyclobenzaprine (FLEXERIL) 10 MG tablet [facility]  Last written: 11/10/15 with 5 refills Last OV: 03/23/16 Please advise. Thanks TNP

## 2016-07-05 NOTE — Telephone Encounter (Signed)
Beverly at Dr. Primus Bravo office returned call. Rise Paganini states that per Dr. Primus Bravo patient has missed several appointments,so he is unable to advise Simona Huh whether or not to prescribe Flexeril for patient. He said that would be at [facility] discretion.

## 2016-07-05 NOTE — Telephone Encounter (Signed)
Contacted [facility] pain clinic. Per receptionist Dr. Primus Bravo is no longer at that office. Receptionist gave me another number to contact Dr. Primus Bravo. [phone]. LMTCB

## 2016-07-05 NOTE — Telephone Encounter (Signed)
If Dr. Primus Bravo agrees, can refill Cyclobenzaprine 10 mg BID #60 once.

## 2016-07-05 NOTE — Telephone Encounter (Signed)
Will get refill available for the next couple months since Dr. Primus Bravo no longer in town. Should get established with another pain management specialist if still having problems.

## 2016-07-06 NOTE — Telephone Encounter (Signed)
Pt informed and voiced understanding of results. 

## 2016-09-13 ENCOUNTER — Other Ambulatory Visit: Payer: Self-pay | Admitting: Family Medicine

## 2016-09-13 DIAGNOSIS — E7800 Pure hypercholesterolemia, unspecified: Secondary | ICD-10-CM

## 2016-09-14 ENCOUNTER — Other Ambulatory Visit: Payer: Self-pay | Admitting: *Deleted

## 2016-09-14 MED ORDER — ALPRAZOLAM 0.5 MG PO TABS: 0.5000 mg | ORAL_TABLET | Freq: Three times a day (TID) | ORAL | 5 refills | Status: DC | PRN

## 2016-09-14 NOTE — Telephone Encounter (Signed)
Rx called in to pharmacy. 

## 2016-09-14 NOTE — Telephone Encounter (Signed)
Please call in alprazolam.  

## 2016-09-23 ENCOUNTER — Telehealth: Payer: Self-pay | Admitting: Family Medicine

## 2016-09-23 ENCOUNTER — Other Ambulatory Visit: Payer: Self-pay | Admitting: Family Medicine

## 2016-09-23 DIAGNOSIS — J069 Acute upper respiratory infection, unspecified: Secondary | ICD-10-CM

## 2016-09-23 MED ORDER — FLUTICASONE PROPIONATE 50 MCG/ACT NA SUSP: 2.0000 | Freq: Every day | NASAL | 0 refills | Status: DC

## 2016-09-23 MED ORDER — DM-GUAIFENESIN ER 30-600 MG PO TB12: 1.0000 | ORAL_TABLET | Freq: Two times a day (BID) | ORAL | 0 refills | Status: DC

## 2016-09-23 MED ORDER — AMOXICILLIN 500 MG PO CAPS: 500.0000 mg | ORAL_CAPSULE | Freq: Three times a day (TID) | ORAL | 0 refills | Status: DC

## 2016-09-23 NOTE — Telephone Encounter (Signed)
Pt is having symptoms of a sinus infection.  Congestion , facial pressure, green colored congestion..  They do not have the funds to come in for a visit.  They would like something called in  [facility]  Call back [phone]  Thanks Con Memos

## 2016-09-23 NOTE — Telephone Encounter (Signed)
Sent medication to the pharmacy. If no better in 5-7 days, should be seen in the office.

## 2016-09-23 NOTE — Telephone Encounter (Signed)
Please review-aa 

## 2016-10-10 ENCOUNTER — Other Ambulatory Visit: Payer: Self-pay | Admitting: Family Medicine

## 2016-11-11 ENCOUNTER — Other Ambulatory Visit: Payer: Self-pay | Admitting: Family Medicine

## 2017-01-12 IMAGING — CR DG CHEST 2V
1 series · 2 of 2 positions shown · non-contrast
Comparison: 03/14/2013

CLINICAL DATA: Right side chest pain and shoulder pain starting
yesterday

EXAM:
CHEST  2 VIEW

[Series 1: kdxr chest pa (or ap) and lat · 0.14mm/px · 2 of 2 slices shown]
[im 1/2]
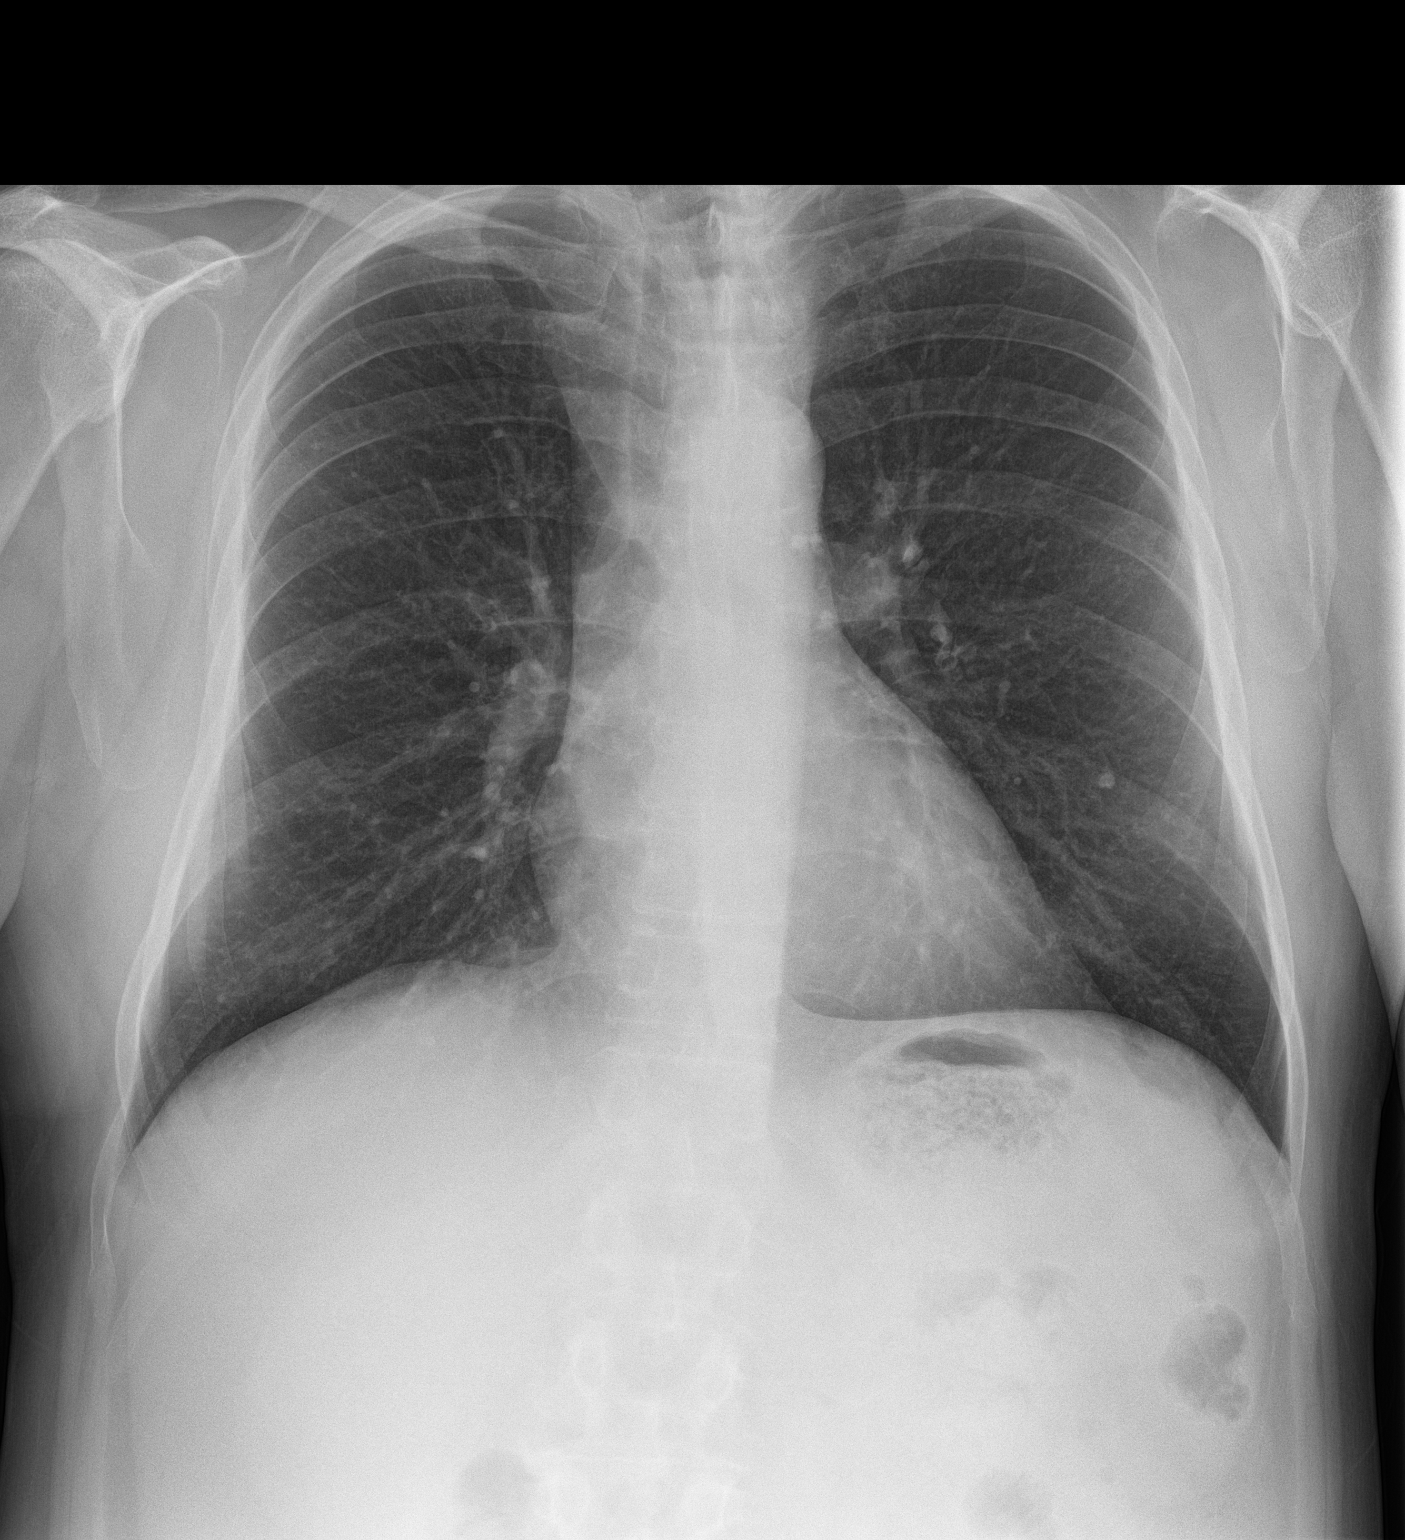
[im 2/2]
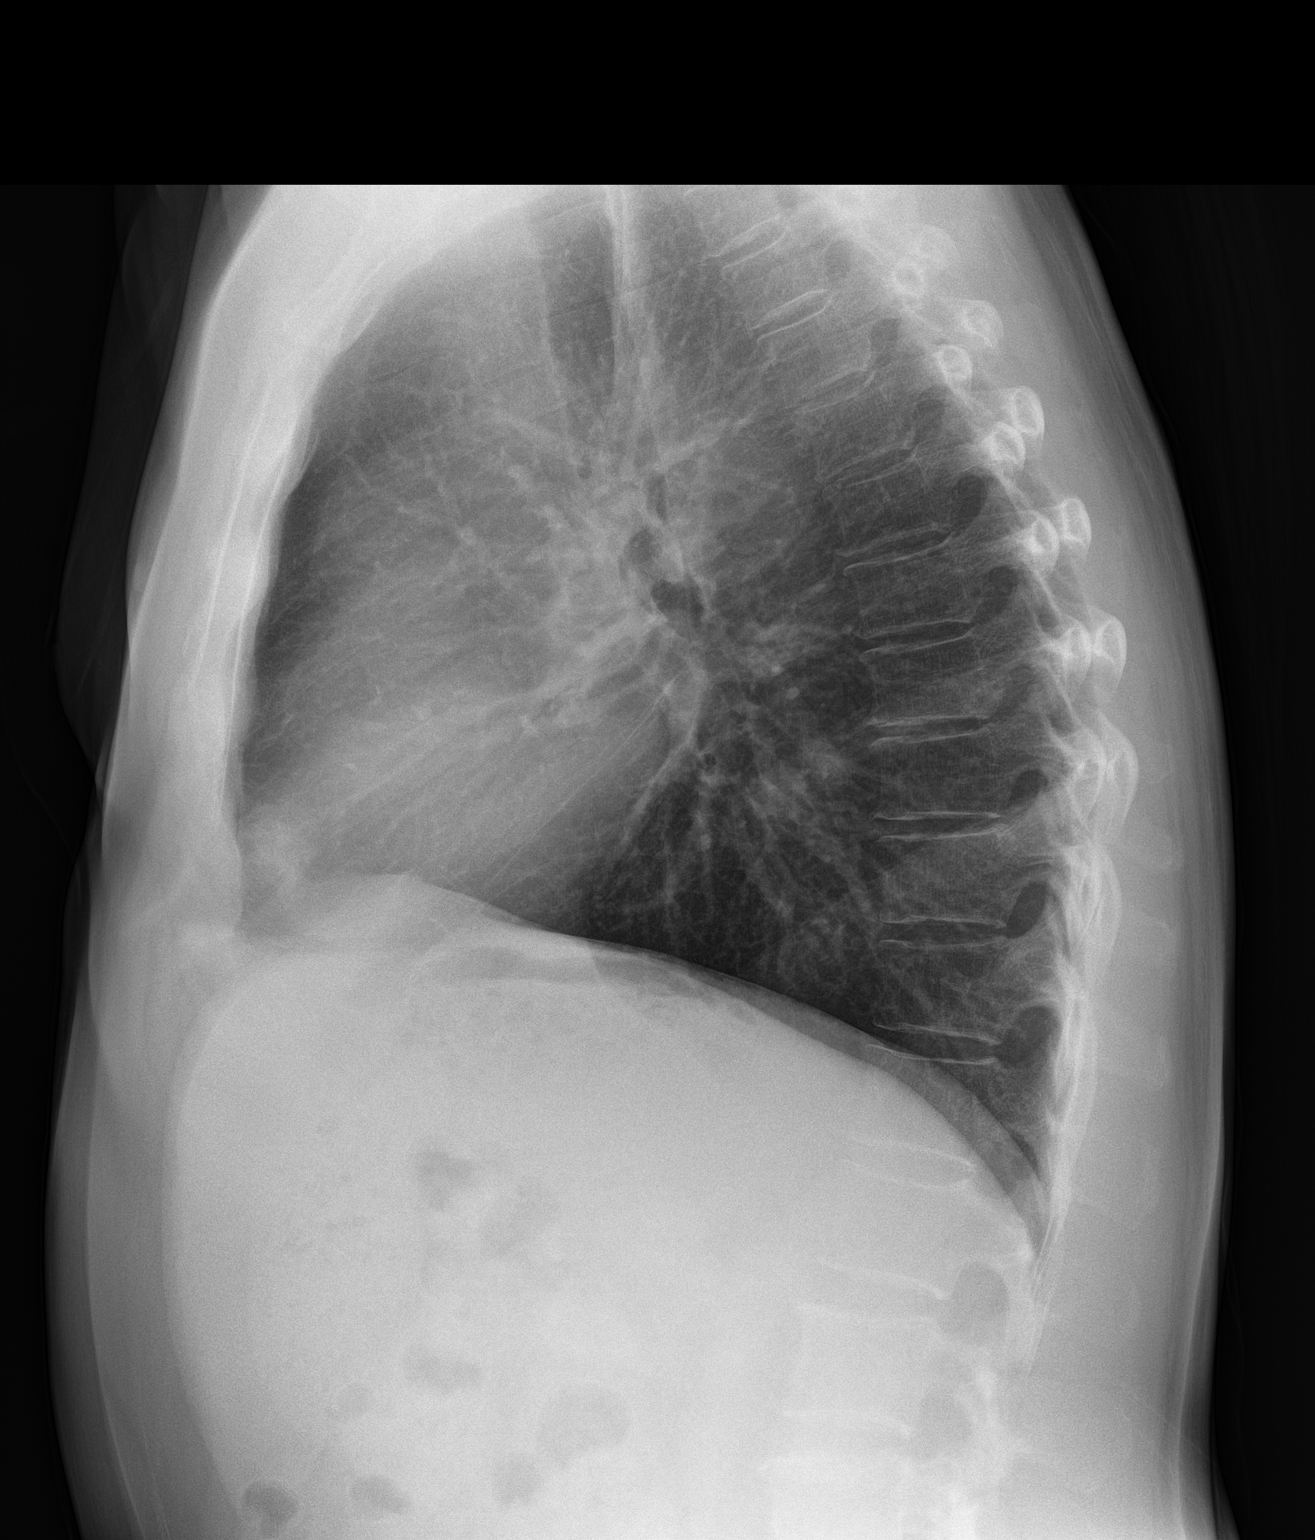

[2 of 2 positions shown; findings below may reference images not displayed]

FINDINGS: Cardiomediastinal silhouette is stable. No acute infiltrate or
pleural effusion. No pulmonary edema. Bony thorax is unremarkable.
IMPRESSION: No active cardiopulmonary disease.

## 2017-03-03 ENCOUNTER — Other Ambulatory Visit: Payer: Self-pay | Admitting: Family Medicine

## 2017-03-09 ENCOUNTER — Telehealth: Payer: Self-pay | Admitting: Family Medicine

## 2017-03-09 ENCOUNTER — Other Ambulatory Visit: Payer: Self-pay

## 2017-03-09 NOTE — Telephone Encounter (Signed)
Can't refill until follow up appointment by Dr. Caryn Section or myself.

## 2017-03-09 NOTE — Telephone Encounter (Signed)
Left message asking pt to schedule appointment. Renaldo Fiddler, CMA

## 2017-03-09 NOTE — Telephone Encounter (Signed)
Pt contacted office for refill request on the following medications:  ALPRAZolam (XANAX) 0.5 MG tablet.  Joshua Acevedo.  CB#[phone]/MW

## 2017-03-09 NOTE — Telephone Encounter (Signed)
LOV 03/23/2016. Last refill 09/14/2016. Renaldo Fiddler, CMA

## 2017-03-10 ENCOUNTER — Encounter: Payer: Self-pay | Admitting: Family Medicine

## 2017-03-10 ENCOUNTER — Ambulatory Visit (INDEPENDENT_AMBULATORY_CARE_PROVIDER_SITE_OTHER): Payer: BLUE CROSS/BLUE SHIELD | Admitting: Family Medicine

## 2017-03-10 VITALS — BP 172/76 | HR 89 | Temp 98.1°F | Wt 153.4 lb

## 2017-03-10 DIAGNOSIS — F411 Generalized anxiety disorder: Secondary | ICD-10-CM | POA: Diagnosis not present

## 2017-03-10 DIAGNOSIS — K219 Gastro-esophageal reflux disease without esophagitis: Secondary | ICD-10-CM | POA: Diagnosis not present

## 2017-03-10 DIAGNOSIS — E7800 Pure hypercholesterolemia, unspecified: Secondary | ICD-10-CM | POA: Diagnosis not present

## 2017-03-10 DIAGNOSIS — I10 Essential (primary) hypertension: Secondary | ICD-10-CM | POA: Diagnosis not present

## 2017-03-10 MED ORDER — ALPRAZOLAM 0.5 MG PO TABS: 0.5000 mg | ORAL_TABLET | Freq: Two times a day (BID) | ORAL | 5 refills | Status: DC | PRN

## 2017-03-10 NOTE — Progress Notes (Signed)
Patient: Joshua Acevedo Male    DOB: 10/08/1961   56 y.o.   MRN: [id number] Visit Date: 03/10/2017  Today's Provider: Vernie Murders, PA   Chief Complaint  Patient presents with  . Hypertension  . Hyperlipidemia  . Anxiety  . Follow-up   Subjective:    HPI  Hypertension, follow-up:  BP Readings from Last 3 Encounters:  03/10/17 (!) 172/76  03/23/16 (!) 144/88  02/25/16 (!) 146/66    He was last seen for hypertension 1 years ago.  BP at that visit was 144/88.             Management since that visit includes continue medicatons. He reports good compliance with treatment.             He is not having side effects.  He is not exercising. He is adherent to low salt diet.   Outside blood pressures are not being checked. He is experiencing none.  Patient denies chest pain, chest pressure/discomfort, fatigue and palpitations.   Cardiovascular risk factors include advanced age (older than 82 for men, 63 for women), dyslipidemia, hypertension, male gender and smoking/ tobacco exposure.  Use of agents associated with hypertension: none.   ------------------------------------------------------------------------    Lipid/Cholesterol, Follow-up:   Last seen for this 1 years ago.  Management since that visit includes continue medicatons.  Last Lipid Panel:    Component Value Date/Time   CHOL 278 (H) 03/23/2016 0911   TRIG 108 03/23/2016 0911   HDL 32 (L) 03/23/2016 0911   CHOLHDL 8.7 (H) 03/23/2016 0911   LDLCALC 224 (H) 03/23/2016 0911    He reports good compliance with treatment. He is not having side effects.   Wt Readings from Last 3 Encounters:  03/10/17 153 lb 6.4 oz (69.6 kg)  03/23/16 153 lb (69.4 kg)  02/25/16 160 lb (72.6 kg)    ------------------------------------------------------------------------ Anxiety Follow Up:  Last seen for this 1 years ago.  Management since that visit includes continue medicatons. He reports good compliance with  treatment. He is not having side effects. Patient reports he has not taken medication in 1 week because he ran out. He reports having anxiety attack today. Patient is requesting refills on Alprazolam.    Past Medical History:  Diagnosis Date  . Arthritis    "everywhere"  . GERD (gastroesophageal reflux disease)   . Hepatitis 2009   "c" - treated  . Hyperlipidemia   . Hypertension   . Shortness of breath dyspnea   . Wears dentures    partial upper   Past Surgical History:  Procedure Laterality Date  . COLONOSCOPY WITH PROPOFOL N/A 08/28/2015   Procedure: COLONOSCOPY WITH PROPOFOL;  Surgeon: Lucilla Lame, MD;  Location: [place];  Service: Endoscopy;  Laterality: N/A;  . NO PAST SURGERIES    . POLYPECTOMY  08/28/2015   Procedure: POLYPECTOMY;  Surgeon: Lucilla Lame, MD;  Location: [place];  Service: Endoscopy;;   Family History  Problem Relation Age of Onset  . Diabetes Paternal Grandfather   . Heart attack Maternal Grandmother   . Heart attack Maternal Grandfather   . Hypertension Mother   . Cataracts Mother   . Diabetes Father   . Coronary artery disease Father    No Known Allergies  Previous Medications   ALPRAZOLAM (XANAX) 0.5 MG TABLET    Take 1 tablet (0.5 mg total) by mouth 3 (three) times daily as needed for anxiety.   ATORVASTATIN (LIPITOR) 40 MG TABLET    TAKE  1 TABLET (40 MG TOTAL) BY MOUTH DAILY.   CHOLECALCIFEROL (VITAMIN D) 1000 UNITS TABLET    Take 3 tablets by mouth daily.    FLUTICASONE (FLONASE) 50 MCG/ACT NASAL SPRAY    Place 2 sprays into both nostrils daily.   GABAPENTIN (NEURONTIN) 100 MG CAPSULE    Limit 2 - 3 capsules by mouth 2 - 3 times per day if tolerated NOTE Neurontin (gabapentin) is now 100 mg size   NIFEDIPINE (PROCARDIA-XL/ADALAT-CC/NIFEDICAL-XL) 30 MG 24 HR TABLET    TAKE 1 TABLET (30 MG TOTAL) BY MOUTH DAILY.   OMEPRAZOLE 20 MG TBEC    Take 2 tablets by mouth daily.    Review of Systems  Constitutional: Negative.    Respiratory: Negative.   Cardiovascular: Negative.   Psychiatric/Behavioral: The patient is nervous/anxious.     Social History  Substance Use Topics  . Smoking status: Current Some Day Smoker    Packs/day: 0.33    Years: 40.00    Types: Cigarettes    Last attempt to quit: 08/19/2015  . Smokeless tobacco: Never Used  . Alcohol use No     Comment: formerly was a heavy user   Objective:   BP (!) 172/76 (BP Location: Left Arm, Patient Position: Sitting, Cuff Size: Normal)   Pulse 89   Temp 98.1 F (36.7 C) (Oral)   Wt 153 lb 6.4 oz (69.6 kg)   SpO2 98%   BMI 24.76 kg/m   Physical Exam  Constitutional: He is oriented to person, place, and time. He appears well-developed and well-nourished. No distress.  HENT:  Head: Normocephalic and atraumatic.  Right Ear: Hearing normal.  Left Ear: Hearing normal.  Nose: Nose normal.  Eyes: Conjunctivae and lids are normal. Right eye exhibits no discharge. Left eye exhibits no discharge. No scleral icterus.  Neck: Neck supple.  Cardiovascular: Normal rate and regular rhythm.   Pulmonary/Chest: Effort normal and breath sounds normal. No respiratory distress.  Abdominal: Soft. Bowel sounds are normal.  Musculoskeletal: Normal range of motion.  Neurological: He is alert and oriented to person, place, and time.  Skin: Skin is intact. No lesion and no rash noted.  Psychiatric: He has a normal mood and affect. His speech is normal and behavior is normal. Thought content normal.      Assessment & Plan:     1. Benign hypertension Out of Xanax for anxiety the past 7 days. Normally BP controlled with Nifedipine 30 mg qd. Has been off all opiates (Hydrocodone) for the past year. No joint pains or flares of gout in the past 6 months. Continues to work in a [employer]. Recheck CBC and CMP. Restrict caffeine and salt intake. Stopped all ETOH intake 6 months ago. Follow up pending lab reports. - CBC with Differential/Platelet - Comprehensive  metabolic panel  2. Gastroesophageal reflux disease without esophagitis Occasionally using Omeprazole. Improved with stopping all ETOH in take 6 months ago. No melena, hematemesis or hematochezia. - CBC with Differential/Platelet  3. Hypercholesteremia Tolerating Lipitor 40 mg qd without myalgias or joint pains. Continue low fat diet and recheck CMP with Lipid panel. - Comprehensive metabolic panel - Lipid panel  4. Generalized anxiety disorder Has been out of his Alprazolam for 7 days and feels some anxiety building again. Uses 0.5 mg tablet BID and this controls symptoms well. Will refill the prescription and recheck labs. Follow up pending reports. - CBC with Differential/Platelet - Comprehensive metabolic panel

## 2017-04-28 ENCOUNTER — Other Ambulatory Visit: Payer: Self-pay | Admitting: Family Medicine

## 2017-04-28 DIAGNOSIS — E7800 Pure hypercholesterolemia, unspecified: Secondary | ICD-10-CM

## 2017-05-22 ENCOUNTER — Other Ambulatory Visit: Payer: Self-pay | Admitting: Family Medicine

## 2017-10-02 ENCOUNTER — Other Ambulatory Visit: Payer: Self-pay | Admitting: Family Medicine

## 2017-10-02 DIAGNOSIS — E7800 Pure hypercholesterolemia, unspecified: Secondary | ICD-10-CM

## 2017-10-12 ENCOUNTER — Other Ambulatory Visit: Payer: Self-pay | Admitting: Family Medicine

## 2017-10-13 NOTE — Telephone Encounter (Signed)
Prescription printed for pick up so patient can schedule follow up appointment in the next 1-2 months.

## 2017-10-13 NOTE — Telephone Encounter (Signed)
RX called in at [facility]. Attempted to contact patient no answer or voicemail.

## 2017-10-18 ENCOUNTER — Other Ambulatory Visit: Payer: Self-pay | Admitting: Family Medicine

## 2017-10-19 NOTE — Telephone Encounter (Signed)
Per Kristopher Oppenheim they did received phoned in RX on 10/13/17. Patient has picked up RX. Contacted patient's wife Angie to advise her that patient needs a follow up within the next 1-2 months

## 2017-10-19 NOTE — Telephone Encounter (Signed)
Looks like this was phoned in to the [facility] on 10-13-17. Check with them to be sure they received it and the patient picked it up. Patient still needs to schedule a follow up appointment in the next 1-2 months.

## 2017-11-21 ENCOUNTER — Ambulatory Visit (INDEPENDENT_AMBULATORY_CARE_PROVIDER_SITE_OTHER): Payer: Self-pay | Admitting: Family Medicine

## 2017-11-21 ENCOUNTER — Encounter: Payer: Self-pay | Admitting: Family Medicine

## 2017-11-21 VITALS — BP 152/72 | HR 76 | Temp 98.3°F | Ht 66.0 in | Wt 157.0 lb

## 2017-11-21 DIAGNOSIS — Z114 Encounter for screening for human immunodeficiency virus [HIV]: Secondary | ICD-10-CM

## 2017-11-21 DIAGNOSIS — R358 Other polyuria: Secondary | ICD-10-CM

## 2017-11-21 DIAGNOSIS — E7800 Pure hypercholesterolemia, unspecified: Secondary | ICD-10-CM

## 2017-11-21 DIAGNOSIS — R3589 Other polyuria: Secondary | ICD-10-CM

## 2017-11-21 DIAGNOSIS — Z720 Tobacco use: Secondary | ICD-10-CM

## 2017-11-21 DIAGNOSIS — R1011 Right upper quadrant pain: Secondary | ICD-10-CM

## 2017-11-21 DIAGNOSIS — R4586 Emotional lability: Secondary | ICD-10-CM

## 2017-11-21 DIAGNOSIS — I10 Essential (primary) hypertension: Secondary | ICD-10-CM

## 2017-11-21 DIAGNOSIS — K219 Gastro-esophageal reflux disease without esophagitis: Secondary | ICD-10-CM

## 2017-11-21 MED ORDER — NIFEDIPINE ER OSMOTIC RELEASE 30 MG PO TB24: 30.0000 mg | ORAL_TABLET | Freq: Every day | ORAL | 6 refills | Status: DC

## 2017-11-21 MED ORDER — OMEPRAZOLE 20 MG PO TBEC: 1.0000 | DELAYED_RELEASE_TABLET | Freq: Every day | ORAL | 6 refills | Status: DC

## 2017-11-21 NOTE — Progress Notes (Signed)
Patient: Joshua Acevedo Male    DOB: 08-10-1961   57 y.o.   MRN: [id number] Visit Date: 11/21/2017  Today's Provider: Vernie Murders, PA   Chief Complaint  Patient presents with  . Hypertension  . Hyperlipidemia  . Anxiety  . Follow-up   Subjective:    HPI  Hypertension, follow-up:     BP Readings from Last 3 Encounters:  03/10/17 (!) 172/76  03/23/16 (!) 144/88  02/25/16 (!) 146/66    He was last seen for hypertension 8 months ago.  BP at that visit was 172/76.             Management since that visit includes continue medicatons, restrict caffeine and salt intake and recheck                 labs. He reports fair compliance with treatment. Patient did not have labs done that were ordered on 03/10/17             He is not having side effects.  He is not exercising. He is adherent to low salt diet.   Outside blood pressures are not being checked. He is experiencing none.  Patient denies chest pain, chest pressure/discomfort, fatigue and palpitations.   Cardiovascular risk factors include advanced age (older than 45 for men, 7 for women), dyslipidemia, hypertension, male gender and smoking/ tobacco exposure.  Use of agents associated with hypertension: none.   ------------------------------------------------------------------------   Lipid/Cholesterol, Follow-up:   Last seen for this 8 months ago.  Management since that visit includes continue medications, diet and recheck labs.  Last Lipid Panel: Labs(Brief)          Component Value Date/Time   CHOL 278 (H) 03/23/2016 0911   TRIG 108 03/23/2016 0911   HDL 32 (L) 03/23/2016 0911   CHOLHDL 8.7 (H) 03/23/2016 0911   LDLCALC 224 (H) 03/23/2016 0911      He reports fair compliance with treatment. Patient did not have labs done that were ordered on 03/10/17 He is not having side effects.   Wt Readings from Last 3 Encounters:  11/21/17 157 lb (71.2 kg)  03/10/17 153 lb 6.4 oz (69.6 kg)    03/23/16 153 lb (69.4 kg)     ------------------------------------------------------------------------ Anxiety Follow Up:  Last seen for this 8 months ago.  Management since that visit includes continue medicatons. He reports good compliance with treatment. He is not having side effects.  Patient is requesting refills on Alprazolam.   Past Medical History:  Diagnosis Date  . Arthritis    "everywhere"  . GERD (gastroesophageal reflux disease)   . Hepatitis 2009   "c" - treated  . Hyperlipidemia   . Hypertension   . Shortness of breath dyspnea   . Wears dentures    partial upper   Past Surgical History:  Procedure Laterality Date  . COLONOSCOPY WITH PROPOFOL N/A 08/28/2015   Procedure: COLONOSCOPY WITH PROPOFOL;  Surgeon: Lucilla Lame, MD;  Location: [place];  Service: Endoscopy;  Laterality: N/A;  . NO PAST SURGERIES    . POLYPECTOMY  08/28/2015   Procedure: POLYPECTOMY;  Surgeon: Lucilla Lame, MD;  Location: [place];  Service: Endoscopy;;   Family History  Problem Relation Age of Onset  . Diabetes Paternal Grandfather   . Heart attack Maternal Grandmother   . Heart attack Maternal Grandfather   . Hypertension Mother   . Cataracts Mother   . Diabetes Father   . Coronary artery disease Father  No Known Allergies  Current Outpatient Medications:  .  ALPRAZolam (XANAX) 0.5 MG tablet, TAKE ONE TABLET BY MOUTH TWICE A DAY AS NEEDED FOR ANXIETY, Disp: 90 tablet, Rfl: 0 .  atorvastatin (LIPITOR) 40 MG tablet, TAKE 1 TABLET BY MOUTH EVERY DAY, Disp: 30 tablet, Rfl: 4 .  cholecalciferol (VITAMIN D) 1000 UNITS tablet, Take 3 tablets by mouth daily. , Disp: , Rfl:  .  fluticasone (FLONASE) 50 MCG/ACT nasal spray, Place 2 sprays into both nostrils daily., Disp: 16 g, Rfl: 0 .  NIFEdipine (PROCARDIA-XL/ADALAT-CC/NIFEDICAL-XL) 30 MG 24 hr tablet, TAKE 1 TABLET (30 MG TOTAL) BY MOUTH DAILY., Disp: 30 tablet, Rfl: 6 .  Omeprazole 20 MG TBEC, Take 2 tablets  by mouth daily., Disp: , Rfl:   Review of Systems  Constitutional: Negative.   Respiratory: Negative.   Cardiovascular: Negative.   Gastrointestinal:       RUQ abdominal discomfort intermittently.  Musculoskeletal: Positive for arthralgias.       Pain and swelling right 1st MTP joint with stiffness.  Psychiatric/Behavioral: Negative.     Social History   Tobacco Use  . Smoking status: Former Smoker    Packs/day: 0.75    Years: 40.00    Pack years: 30.00    Types: Cigarettes  . Smokeless tobacco: Never Used  Substance Use Topics  . Alcohol use: No    Alcohol/week: 0.0 oz    Comment: formerly was a heavy user   Objective:   BP (!) 152/72 (BP Location: Right Arm, Patient Position: Sitting, Cuff Size: Normal)   Pulse 76   Temp 98.3 F (36.8 C) (Oral)   Ht 5\' 6"  (1.676 m)   Wt 157 lb (71.2 kg)   SpO2 99%   BMI 25.34 kg/m    Physical Exam  Constitutional: He is oriented to person, place, and time. He appears well-developed and well-nourished. No distress.  HENT:  Head: Normocephalic and atraumatic.  Right Ear: Hearing normal.  Left Ear: Hearing normal.  Nose: Nose normal.  Eyes: Conjunctivae and lids are normal. Right eye exhibits no discharge. Left eye exhibits no discharge. No scleral icterus.  Neck: Neck supple.  Cardiovascular: Normal rate and regular rhythm.  Pulmonary/Chest: Effort normal and breath sounds normal. No respiratory distress.  Abdominal: Soft. Bowel sounds are normal.  Intermittent soreness in the RUQ with history of hepatitis C treatment in 2010 with Interferon and Ribavirin by Dr. Clayborn Bigness (ID).  Musculoskeletal:  Stiff and hard nodule on the dorsum of the right 1st MTP joint. No redness to pain to palpate.  Neurological: He is alert and oriented to person, place, and time.  Skin: Skin is intact. No lesion and no rash noted.  Psychiatric: He has a normal mood and affect. His speech is normal and behavior is normal. Thought content normal.         Assessment & Plan:     1. Benign hypertension BP elevated today. Needs refill of the Procardia that normally controls BP and Raynaud's Phenomenon. Check CBC, CMP, TSH and Lipid Panel. No chest pains or palpitations. Recheck pending reports. - CBC with Differential - Lipid Profile - Comprehensive Metabolic Panel (CMET) - TSH - NIFEdipine (PROCARDIA-XL/ADALAT-CC/NIFEDICAL-XL) 30 MG 24 hr tablet; Take 1 tablet (30 mg total) by mouth daily.  Dispense: 30 tablet; Refill: 6  2. Gastroesophageal reflux disease without esophagitis Dyspepsia controlled well with Omeprazole once a day. No hematemesis or melena. Check CBC and refill Omeprazole. May need to consider GI recheck with upper endoscopy (wants to  postpone due to self-pay at the present). - CBC with Differential - Omeprazole 20 MG TBEC; Take 1 tablet (20 mg total) by mouth daily.  Dispense: 30 each; Refill: 6  3. Hypercholesteremia Still taking Atorvastatin 40 mg qd and trying to control fats in diet. More active with improvement in lower extremity pains and stopping all narcotics. Check labs and continue present regimen. Recheck pending reports. - Lipid Profile - Comprehensive Metabolic Panel (CMET) - TSH  4. RUQ discomfort Intermittent occurrences without association with food. History of mobile gallstones on ultrasound in 2009. No vomiting, gas or diarrhea. Will continue PPI and get labs. May need repeat RUQ ultrasound if more frequent occurrences (no insurance right now). - CBC with Differential - Comprehensive Metabolic Panel (CMET)  5. Screening for HIV (human immunodeficiency virus) - HIV antibody  6. Polyuria Family history positive for diabetes. States he frequently has to urinate large quantities. No nocturia or incontinence. No decrease in urine stream. Will check labs with Hgb A1C to rule out DM. - Comprehensive Metabolic Panel (CMET) - Hemoglobin A1c  7. Mood swings Irritability with stressful situations  intermittently. Alprazolam 0.5 mg BID prn helps with control but finds there are times he may need it more often. Will check labs for metabolic disorder. May need to consider adding a little Abilify as a mood stabilizer. Patient prefers not to add any additional medications since he went through narcotic withdrawal (hydrocodone). - CBC with Differential - Comprehensive Metabolic Panel (CMET) - TSH  8. Tobacco use Continues to 15 cigarettes a day on average. Has tried to stop "cold [place]" and Nicotine tablets without much success. Encouraged to taper down. He prefers to stop on his own without extra medications. Stopping will help hypertension.   Vernie Murders, PA  [facility]

## 2017-11-24 ENCOUNTER — Ambulatory Visit: Payer: BLUE CROSS/BLUE SHIELD | Admitting: Family Medicine

## 2017-11-28 ENCOUNTER — Other Ambulatory Visit: Payer: Self-pay | Admitting: Family Medicine

## 2017-11-30 NOTE — Telephone Encounter (Signed)
RX called in at [facility]

## 2017-11-30 NOTE — Telephone Encounter (Signed)
Printed prescription for pick up. If still having some of the moodiness and irritability, Zyprexa, Abilify or Effexor may be more helpful.

## 2017-12-05 LAB — CBC WITH DIFFERENTIAL/PLATELET
BASOS ABS: 0 10*3/uL (ref 0.0–0.2)
Basos: 0 %
EOS (ABSOLUTE): 0.1 10*3/uL (ref 0.0–0.4)
Eos: 1 %
HEMOGLOBIN: 15.6 g/dL (ref 13.0–17.7)
Hematocrit: 43.4 % (ref 37.5–51.0)
IMMATURE GRANS (ABS): 0 10*3/uL (ref 0.0–0.1)
IMMATURE GRANULOCYTES: 0 %
LYMPHS: 44 %
Lymphocytes Absolute: 3.1 10*3/uL (ref 0.7–3.1)
MCH: 31 pg (ref 26.6–33.0)
MCHC: 35.9 g/dL — ABNORMAL HIGH (ref 31.5–35.7)
MCV: 86 fL (ref 79–97)
MONOCYTES: 8 %
Monocytes Absolute: 0.6 10*3/uL (ref 0.1–0.9)
NEUTROS ABS: 3.2 10*3/uL (ref 1.4–7.0)
NEUTROS PCT: 47 %
PLATELETS: 240 10*3/uL (ref 150–379)
RBC: 5.04 x10E6/uL (ref 4.14–5.80)
RDW: 13.6 % (ref 12.3–15.4)
WBC: 7 10*3/uL (ref 3.4–10.8)

## 2017-12-05 LAB — COMPREHENSIVE METABOLIC PANEL
A/G RATIO: 2.2 (ref 1.2–2.2)
ALK PHOS: 119 IU/L — AB (ref 39–117)
ALT: 26 IU/L (ref 0–44)
AST: 21 IU/L (ref 0–40)
Albumin: 4.9 g/dL (ref 3.5–5.5)
BILIRUBIN TOTAL: 0.5 mg/dL (ref 0.0–1.2)
BUN / CREAT RATIO: 11 (ref 9–20)
BUN: 12 mg/dL (ref 6–24)
CHLORIDE: 102 mmol/L (ref 96–106)
CO2: 22 mmol/L (ref 20–29)
Calcium: 9.8 mg/dL (ref 8.7–10.2)
Creatinine, Ser: 1.06 mg/dL (ref 0.76–1.27)
GFR calc Af Amer: 89 mL/min/{1.73_m2} (ref >59)
GFR calc non Af Amer: 77 mL/min/{1.73_m2} (ref >59)
GLOBULIN, TOTAL: 2.2 g/dL (ref 1.5–4.5)
Glucose: 143 mg/dL — ABNORMAL HIGH (ref 65–99)
POTASSIUM: 4.8 mmol/L (ref 3.5–5.2)
SODIUM: 142 mmol/L (ref 134–144)
Total Protein: 7.1 g/dL (ref 6.0–8.5)

## 2017-12-05 LAB — LIPID PANEL
CHOLESTEROL TOTAL: 264 mg/dL — AB (ref 100–199)
Chol/HDL Ratio: 6.1 ratio — ABNORMAL HIGH (ref 0.0–5.0)
HDL: 43 mg/dL (ref >39)
LDL CALC: 202 mg/dL — AB (ref 0–99)
TRIGLYCERIDES: 93 mg/dL (ref 0–149)
VLDL CHOLESTEROL CAL: 19 mg/dL (ref 5–40)

## 2017-12-05 LAB — TSH: TSH: 1.73 u[IU]/mL (ref 0.450–4.500)

## 2017-12-05 LAB — HIV ANTIBODY (ROUTINE TESTING W REFLEX): HIV SCREEN 4TH GENERATION: NONREACTIVE

## 2017-12-05 LAB — HEMOGLOBIN A1C
Est. average glucose Bld gHb Est-mCnc: 117 mg/dL
HEMOGLOBIN A1C: 5.7 % — AB (ref 4.8–5.6)

## 2018-01-05 ENCOUNTER — Other Ambulatory Visit: Payer: Self-pay | Admitting: Family Medicine

## 2018-01-07 ENCOUNTER — Other Ambulatory Visit: Payer: Self-pay | Admitting: Family Medicine

## 2018-02-11 ENCOUNTER — Other Ambulatory Visit: Payer: Self-pay | Admitting: Family Medicine

## 2018-03-28 ENCOUNTER — Other Ambulatory Visit: Payer: Self-pay | Admitting: Family Medicine

## 2018-03-28 DIAGNOSIS — E7800 Pure hypercholesterolemia, unspecified: Secondary | ICD-10-CM

## 2018-03-29 ENCOUNTER — Other Ambulatory Visit: Payer: Self-pay | Admitting: Family Medicine

## 2018-03-29 NOTE — Telephone Encounter (Signed)
Remind patient to schedule follow up appointment to check BP and mood issues in the next 4-6 weeks.

## 2018-03-30 NOTE — Telephone Encounter (Signed)
Left patient a message advising him that RX has been sent to pharmacy and to call the office to schedule a follow up appointment.

## 2018-05-07 ENCOUNTER — Other Ambulatory Visit: Payer: Self-pay | Admitting: Family Medicine

## 2018-07-03 ENCOUNTER — Other Ambulatory Visit: Payer: Self-pay | Admitting: Family Medicine

## 2018-07-06 ENCOUNTER — Other Ambulatory Visit: Payer: Self-pay | Admitting: Family Medicine

## 2018-07-06 DIAGNOSIS — F411 Generalized anxiety disorder: Secondary | ICD-10-CM

## 2018-07-06 MED ORDER — ALPRAZOLAM 0.5 MG PO TABS: ORAL_TABLET | ORAL | 0 refills | Status: DC

## 2018-07-06 NOTE — Telephone Encounter (Signed)
Sent refill to [facility]. Still need to plan follow up in the next 3-4 months.

## 2018-07-06 NOTE — Telephone Encounter (Signed)
Patient needs refill on Xanax 0.5 mg. He cannot afford to come in right now. Just spent $1100.00 on car.   Would like it called to [facility].

## 2018-07-10 NOTE — Telephone Encounter (Signed)
Mrs. Brouillard advised.

## 2018-09-30 ENCOUNTER — Other Ambulatory Visit: Payer: Self-pay | Admitting: Family Medicine

## 2018-11-09 ENCOUNTER — Emergency Department: Payer: Self-pay

## 2018-11-09 ENCOUNTER — Emergency Department
Admission: EM | Admit: 2018-11-09 | Discharge: 2018-11-10 | Disposition: A | Discharge status: Home / Self Care | Payer: Self-pay | Attending: Emergency Medicine | Admitting: Emergency Medicine

## 2018-11-09 ENCOUNTER — Encounter: Payer: Self-pay | Admitting: Emergency Medicine

## 2018-11-09 ENCOUNTER — Other Ambulatory Visit: Payer: Self-pay

## 2018-11-09 ENCOUNTER — Telehealth: Payer: Self-pay

## 2018-11-09 DIAGNOSIS — I10 Essential (primary) hypertension: Secondary | ICD-10-CM | POA: Insufficient documentation

## 2018-11-09 DIAGNOSIS — Z79899 Other long term (current) drug therapy: Secondary | ICD-10-CM | POA: Insufficient documentation

## 2018-11-09 DIAGNOSIS — A0472 Enterocolitis due to Clostridium difficile, not specified as recurrent: Secondary | ICD-10-CM

## 2018-11-09 DIAGNOSIS — M791 Myalgia, unspecified site: Secondary | ICD-10-CM

## 2018-11-09 DIAGNOSIS — R197 Diarrhea, unspecified: Secondary | ICD-10-CM | POA: Insufficient documentation

## 2018-11-09 DIAGNOSIS — R1084 Generalized abdominal pain: Secondary | ICD-10-CM | POA: Insufficient documentation

## 2018-11-09 DIAGNOSIS — Z87891 Personal history of nicotine dependence: Secondary | ICD-10-CM | POA: Insufficient documentation

## 2018-11-09 HISTORY — DX: Unspecified viral hepatitis C without hepatic coma: B19.20

## 2018-11-09 LAB — COMPREHENSIVE METABOLIC PANEL
ALK PHOS: 92 U/L (ref 38–126)
ALT: 22 U/L (ref 0–44)
AST: 19 U/L (ref 15–41)
Albumin: 4.9 g/dL (ref 3.5–5.0)
Anion gap: 7 (ref 5–15)
BUN: 13 mg/dL (ref 6–20)
CALCIUM: 9.1 mg/dL (ref 8.9–10.3)
CO2: 26 mmol/L (ref 22–32)
Chloride: 104 mmol/L (ref 98–111)
Creatinine, Ser: 0.72 mg/dL (ref 0.61–1.24)
GFR calc Af Amer: >60 mL/min (ref >60)
GFR calc non Af Amer: >60 mL/min (ref >60)
GLUCOSE: 133 mg/dL — AB (ref 70–99)
Potassium: 3.3 mmol/L — ABNORMAL LOW (ref 3.5–5.1)
Sodium: 137 mmol/L (ref 135–145)
TOTAL PROTEIN: 8 g/dL (ref 6.5–8.1)
Total Bilirubin: 0.7 mg/dL (ref 0.3–1.2)

## 2018-11-09 LAB — GASTROINTESTINAL PANEL BY PCR, STOOL (REPLACES STOOL CULTURE)
Adenovirus F40/41: NOT DETECTED
Astrovirus: NOT DETECTED
CAMPYLOBACTER SPECIES: NOT DETECTED
Cryptosporidium: NOT DETECTED
Cyclospora cayetanensis: NOT DETECTED
ENTEROTOXIGENIC E COLI (ETEC): NOT DETECTED
Entamoeba histolytica: NOT DETECTED
Enteroaggregative E coli (EAEC): NOT DETECTED
Enteropathogenic E coli (EPEC): NOT DETECTED
Giardia lamblia: NOT DETECTED
Norovirus GI/GII: NOT DETECTED
Plesimonas shigelloides: NOT DETECTED
ROTAVIRUS A: NOT DETECTED
SHIGA LIKE TOXIN PRODUCING E COLI (STEC): NOT DETECTED
Salmonella species: NOT DETECTED
Sapovirus (I, II, IV, and V): NOT DETECTED
Shigella/Enteroinvasive E coli (EIEC): NOT DETECTED
Vibrio cholerae: NOT DETECTED
Vibrio species: NOT DETECTED
Yersinia enterocolitica: NOT DETECTED

## 2018-11-09 LAB — URINALYSIS, COMPLETE (UACMP) WITH MICROSCOPIC
BILIRUBIN URINE: NEGATIVE
Bacteria, UA: NONE SEEN
Glucose, UA: NEGATIVE mg/dL
HGB URINE DIPSTICK: NEGATIVE
Ketones, ur: NEGATIVE mg/dL
Leukocytes, UA: NEGATIVE
Nitrite: NEGATIVE
Protein, ur: 30 mg/dL — AB
Specific Gravity, Urine: 1.035 — ABNORMAL HIGH (ref 1.005–1.030)
Squamous Epithelial / HPF: NONE SEEN (ref 0–5)
pH: 6 (ref 5.0–8.0)

## 2018-11-09 LAB — C DIFFICILE QUICK SCREEN W PCR REFLEX
C Diff antigen: POSITIVE — AB
C Diff interpretation: DETECTED
C Diff toxin: POSITIVE — AB

## 2018-11-09 LAB — CBC
HCT: 42.6 % (ref 39.0–52.0)
Hemoglobin: 14.9 g/dL (ref 13.0–17.0)
MCH: 30.5 pg (ref 26.0–34.0)
MCHC: 35 g/dL (ref 30.0–36.0)
MCV: 87.3 fL (ref 80.0–100.0)
Platelets: 240 10*3/uL (ref 150–400)
RBC: 4.88 MIL/uL (ref 4.22–5.81)
RDW: 12.5 % (ref 11.5–15.5)
WBC: 23.8 10*3/uL — ABNORMAL HIGH (ref 4.0–10.5)
nRBC: 0.1 % (ref 0.0–0.2)

## 2018-11-09 LAB — LIPASE, BLOOD: Lipase: 24 U/L (ref 11–51)

## 2018-11-09 MED ORDER — ONDANSETRON HCL 4 MG/2ML IJ SOLN
4.0000 mg | Freq: Once | INTRAMUSCULAR | Status: AC
  Administered 2018-11-09: 4 mg via INTRAVENOUS
  Filled 2018-11-09: qty 2

## 2018-11-09 MED ORDER — VANCOMYCIN 50 MG/ML ORAL SOLUTION
125.0000 mg | Freq: Four times a day (QID) | ORAL | Status: DC
  Administered 2018-11-10: 125 mg via ORAL
  Filled 2018-11-09: qty 2.5

## 2018-11-09 MED ORDER — ONDANSETRON 4 MG PO TBDP: 4.0000 mg | ORAL_TABLET | Freq: Three times a day (TID) | ORAL | 0 refills | Status: DC | PRN

## 2018-11-09 MED ORDER — IOHEXOL 300 MG/ML  SOLN
100.0000 mL | Freq: Once | INTRAMUSCULAR | Status: AC | PRN
  Administered 2018-11-09: 100 mL via INTRAVENOUS

## 2018-11-09 MED ORDER — SODIUM CHLORIDE 0.9% FLUSH: 3.0000 mL | Freq: Once | INTRAVENOUS | Status: DC

## 2018-11-09 MED ORDER — VANCOMYCIN HCL 125 MG PO CAPS: 125.0000 mg | ORAL_CAPSULE | Freq: Four times a day (QID) | ORAL | 0 refills | Status: DC

## 2018-11-09 MED ORDER — VANCOMYCIN HCL 125 MG PO CAPS: 125.0000 mg | ORAL_CAPSULE | Freq: Four times a day (QID) | ORAL | 0 refills | Status: AC

## 2018-11-09 MED ORDER — FAMOTIDINE IN NACL 20-0.9 MG/50ML-% IV SOLN
20.0000 mg | Freq: Once | INTRAVENOUS | Status: AC
  Administered 2018-11-09: 20 mg via INTRAVENOUS
  Filled 2018-11-09: qty 50

## 2018-11-09 MED ORDER — SODIUM CHLORIDE 0.9 % IV BOLUS
1000.0000 mL | Freq: Once | INTRAVENOUS | Status: AC
  Administered 2018-11-09: 1000 mL via INTRAVENOUS

## 2018-11-09 MED ORDER — FAMOTIDINE 20 MG PO TABS: 20.0000 mg | ORAL_TABLET | Freq: Two times a day (BID) | ORAL | 0 refills | Status: DC

## 2018-11-09 MED ORDER — VANCOMYCIN 50 MG/ML ORAL SOLUTION
125.0000 mg | ORAL | Status: AC
  Administered 2018-11-09: 125 mg via ORAL
  Filled 2018-11-09: qty 2.5

## 2018-11-09 NOTE — Telephone Encounter (Signed)
Patient called office with concerns of heart palpitations, "erratic breathing", diarrhea, muscle pain in lower extremities, weakness and body chills. Patient states that these symptoms all began 24hrs ago, patient states that he was seen at [facility] on 10/31/18 and treated for sinus infection with Amoxicillin, patient states that he is still currently on medication and sinus symptoms have cleared. Patient denies fever, numbness/tingling of extremities, dizziness, confusion, decreased appetite, nausea or vomiting. Given cardiac symptoms and signs possibly pointing to dehydration or another I advised patient to go to nearest ED for evaluation. Patient understood. KW

## 2018-11-09 NOTE — ED Provider Notes (Signed)
[facility] Emergency Department Provider Note  ____________________________________________   First MD Initiated Contact with Patient 11/09/18 1358     (approximate)  I have reviewed the triage vital signs and the nursing notes.   HISTORY  Chief Complaint Diarrhea and Weakness   HPI Joshua Acevedo is a 58 y.o. male with a history of hepatitis C, hypertension and hyperlipidemia who is presenting with 1 day of diarrhea.  He states that he had 20 episodes of watery diarrhea this morning associated with abdominal cramping.  Denies any blood in the stool.  Denies any nausea.  States that he is now having generalized weakness as well as muscle cramping.  Says that he has been on Augmentin for 8 days at this point for sinus infection.  Says the sinus function has improved and is no longer having green mucus.  He goes on further says over the past month he feels like he has had a decreased appetite and is not able to tolerate as much food and has had bloating in his abdomen.   Past Medical History:  Diagnosis Date  . Arthritis    "everywhere"  . GERD (gastroesophageal reflux disease)   . Hepatitis 2009   "c" - treated  . Hepatitis C   . Hyperlipidemia   . Hypertension   . Shortness of breath dyspnea   . Wears dentures    partial upper    Patient Active Problem List   Diagnosis Date Noted  . Benign neoplasm of descending colon   . Facet syndrome, lumbar 08/10/2015  . Fibromyalgia 06/25/2015  . Primary Raynaud's phenomenon 06/25/2015  . Arthralgia of multiple joints 03/16/2015  . Chronic pain associated with significant psychosocial dysfunction 03/16/2015  . Family history of arthritis 03/16/2015  . Fatigue 03/16/2015  . Acid reflux 03/16/2015  . Gout 03/16/2015  . Hypercholesteremia 03/16/2015  . Benign hypertension 03/16/2015  . Borderline diabetes 03/16/2015  . Decreased libido 03/16/2015  . 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 Cannabis abuse  03/16/2015  . Changeable mood 03/16/2015  . Cramp of limb 03/16/2015  . Acquired trigger finger 03/16/2015  . Raynaud's syndrome 03/16/2015  . Vitamin D deficiency 03/16/2015  . Paroxysmal digital cyanosis 01/08/2014  . Breathlessness on exertion 12/15/2009  . Muscle ache 07/14/2009  . 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 LBP (low back pain) 05/15/2009  . Hepatitis C virus infection 06/02/2008  . Malaise and fatigue 05/01/2008    Past Surgical History:  Procedure Laterality Date  . COLONOSCOPY WITH PROPOFOL N/A 08/28/2015   Procedure: COLONOSCOPY WITH PROPOFOL;  Surgeon: Lucilla Lame, MD;  Location: [place];  Service: Endoscopy;  Laterality: N/A;  . NO PAST SURGERIES    . POLYPECTOMY  08/28/2015   Procedure: POLYPECTOMY;  Surgeon: Lucilla Lame, MD;  Location: [place];  Service: Endoscopy;;    Prior to Admission medications   Medication Sig Start Date End Date Taking? Authorizing Provider  atorvastatin (LIPITOR) 40 MG tablet TAKE 1 TABLET BY MOUTH EVERY DAY 03/29/18  Yes Chrismon, Vickki Muff, PA  cholecalciferol (VITAMIN D) 1000 UNITS tablet Take 3 tablets by mouth daily.    Yes [provider]  fluticasone (FLONASE) 50 MCG/ACT nasal spray Place 2 sprays into both nostrils daily. 09/23/16  Yes Chrismon, Vickki Muff, PA  NIFEdipine (PROCARDIA-XL/NIFEDICAL-XL) 30 MG 24 hr tablet TAKE 1 TABLET BY MOUTH EVERY DAY 10/01/18  Yes Chrismon, Vickki Muff, PA  Omeprazole 20 MG TBEC Take 1 tablet (20 mg total) by mouth  daily. 11/21/17  Yes Chrismon, Vickki Muff, PA    Allergies Patient has no known allergies.  Family History  Problem Relation Age of Onset  . Diabetes Paternal Grandfather   . Heart attack Maternal Grandmother   . Heart attack Maternal Grandfather   . Hypertension Mother   . Cataracts Mother   . Diabetes Father   . Coronary artery disease Father     Social History Social History   Tobacco Use  . Smoking status: Former Smoker    Packs/day: 0.75     Years: 40.00    Pack years: 30.00    Types: Cigarettes  . Smokeless tobacco: Never Used  Substance Use Topics  . Alcohol use: No    Alcohol/week: 0.0 standard drinks    Comment: formerly was a heavy user  . Drug use: No    Comment: previously used marijuana, quit in 2009 when diagnosed with Hepatitis C    Review of Systems  Constitutional: No fever/chills Eyes: No visual changes. ENT: No sore throat. Cardiovascular: Denies chest pain. Respiratory: Denies shortness of breath. Gastrointestinal:   No constipation. Genitourinary: Negative for dysuria. Musculoskeletal: Negative for back pain. Skin: Negative for rash. Neurological: Negative for headaches, focal weakness or numbness.   ____________________________________________   PHYSICAL EXAM:  VITAL SIGNS: ED Triage Vitals  Enc Vitals Group     BP 11/09/18 1226 (!) 154/72     Pulse Rate 11/09/18 1226 88     Resp 11/09/18 1226 (!) 24     Temp 11/09/18 1226 98.6 F (37 C)     Temp Source 11/09/18 1226 Oral     SpO2 11/09/18 1226 100 %     Weight 11/09/18 1228 155 lb (70.3 kg)     Height 11/09/18 1228 5\' 6"  (1.676 m)     Head Circumference --      Peak Flow --      Pain Score 11/09/18 1228 9     Pain Loc --      Pain Edu? --      Excl. in [place]? --     Constitutional: Alert and oriented. Well appearing and in no acute distress. Eyes: Conjunctivae are normal.  Head: Atraumatic. Nose: No congestion/rhinnorhea. Mouth/Throat: Mucous membranes are moist.  Neck: No stridor.   Cardiovascular: Normal rate, regular rhythm. Grossly normal heart sounds.   Respiratory: Normal respiratory effort.  No retractions. Lungs CTAB. Gastrointestinal: Soft with very mild generalized abdominal tenderness to palpation. No distention.  Musculoskeletal: No lower extremity tenderness nor edema.  No joint effusions. Neurologic:  Normal speech and language. No gross focal neurologic deficits are appreciated. Skin:  Skin is warm, dry and  intact. No rash noted. Psychiatric: Mood and affect are normal. Speech and behavior are normal.  ____________________________________________   LABS (all labs ordered are listed, but only abnormal results are displayed)  Labs Reviewed  COMPREHENSIVE METABOLIC PANEL - Abnormal; Notable for the following components:      Result Value   Potassium 3.3 (*)    Glucose, Bld 133 (*)    All other components within normal limits  CBC - Abnormal; Notable for the following components:   WBC 23.8 (*)    All other components within normal limits  C DIFFICILE QUICK SCREEN W PCR REFLEX  GASTROINTESTINAL PANEL BY PCR, STOOL (REPLACES STOOL CULTURE)  LIPASE, BLOOD  URINALYSIS, COMPLETE (UACMP) WITH MICROSCOPIC   ____________________________________________  EKG   ____________________________________________  RADIOLOGY  Pending CT abdomen ____________________________________________   PROCEDURES  Procedure(s) performed:  Procedures  Critical Care performed:   ____________________________________________   INITIAL IMPRESSION / ASSESSMENT AND PLAN / ED COURSE  Pertinent labs & imaging results that were available during my care of the patient were reviewed by me and considered in my medical decision making (see chart for details).  Differential diagnosis includes, but is not limited to, acute appendicitis, renal colic, testicular torsion, urinary tract infection/pyelonephritis, prostatitis,  epididymitis, diverticulitis, small bowel obstruction or ileus, colitis, abdominal aortic aneurysm, gastroenteritis, hernia, etc. As part of my medical decision making, I reviewed the following data within the electronic MEDICAL RECORD NUMBER Notes from prior ED visits  ----------------------------------------- 3:30 PM on 11/09/2018 -----------------------------------------  Pending CT of the abdomen and pelvis as well as stool studies at this time.  Patient to be fluid hydrated.  White blood  cell count of 23.8.  Concern for C. difficile without white count although the timeline is a bit off of the Augmentin usage.  We would expect a several week delay between Augmentin usage and C. difficile onset.  Signed out to Dr. Joni Fears. ____________________________________________   FINAL CLINICAL IMPRESSION(S) / ED DIAGNOSES Diarrhea.  myalgia   NEW MEDICATIONS STARTED DURING THIS VISIT:  New Prescriptions   No medications on file     Note:  This document was prepared using Dragon voice recognition software and may include unintentional dictation errors.     Orbie Pyo, MD 11/09/18 [phone]

## 2018-11-09 NOTE — Telephone Encounter (Signed)
Acknowledged.

## 2018-11-09 NOTE — ED Notes (Signed)
Patient given PO fluids and encouraged to drink per MD order. Patient provided with call bell and instructed to alert RN if nausea/emesis return.

## 2018-11-09 NOTE — ED Triage Notes (Signed)
Pt state that he has been on an antibiotic for a sinus infection, pt states that this am at 0300 he started with diarrhea and reports around 20 episodes since, pt appears pale and states that his breathing is faster than normal

## 2018-11-09 NOTE — ED Notes (Signed)
Patient able to tolerate PO fuids

## 2018-11-09 NOTE — ED Notes (Signed)
ED Provider at bedside. 

## 2018-11-10 NOTE — ED Notes (Signed)
Reviewed discharge instructions, follow-up care, and prescriptions with patient. Patient verbalized understanding of all information reviewed. Patient stable, with no distress noted at this time.    

## 2018-11-14 NOTE — Progress Notes (Signed)
Patient: Joshua Acevedo Male    DOB: [birth year]   58 y.o.   MRN: [id number] Visit Date: 11/15/2018  Today's Provider: Vernie Murders, PA   Chief Complaint  Patient presents with  . Follow-up    ER   Subjective:     HPI  Follow Up ER Visit  Patient is here for ER follow up.  He was recently seen at [facility] for diarrhea and Myalgia on 11/09/2018. Treatment for this included imaging, fluid. He reports excellent compliance with treatment. He reports this condition is Improved.  ------------------------------------------------------------------------------------ Past Medical History:  Diagnosis Date  . Arthritis    "everywhere"  . GERD (gastroesophageal reflux disease)   . Hepatitis 2009   "c" - treated  . Hepatitis C   . Hyperlipidemia   . Hypertension   . Shortness of breath dyspnea   . Wears dentures    partial upper   Patient Active Problem List   Diagnosis Date Noted  . Benign neoplasm of descending colon   . Facet syndrome, lumbar 08/10/2015  . Fibromyalgia 06/25/2015  . Primary Raynaud's phenomenon 06/25/2015  . Arthralgia of multiple joints 03/16/2015  . Chronic pain associated with significant psychosocial dysfunction 03/16/2015  . Family history of arthritis 03/16/2015  . Fatigue 03/16/2015  . Acid reflux 03/16/2015  . Gout 03/16/2015  . Hypercholesteremia 03/16/2015  . Benign hypertension 03/16/2015  . Borderline diabetes 03/16/2015  . Decreased libido 03/16/2015  . 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 Cannabis abuse 03/16/2015  . Changeable mood 03/16/2015  . Cramp of limb 03/16/2015  . Acquired trigger finger 03/16/2015  . Raynaud's syndrome 03/16/2015  . Vitamin D deficiency 03/16/2015  . Paroxysmal digital cyanosis 01/08/2014  . Breathlessness on exertion 12/15/2009  . Muscle ache 07/14/2009  . Bone marrow failure ([place]) 07/08/2009  . LBP (low back pain) 05/15/2009  . Hepatitis C virus infection 06/02/2008  . Malaise and fatigue 05/01/2008    Past Surgical History:  Procedure Laterality Date  . COLONOSCOPY WITH PROPOFOL N/A 08/28/2015   Procedure: COLONOSCOPY WITH PROPOFOL;  Surgeon: Lucilla Lame, MD;  Location: [place];  Service: Endoscopy;  Laterality: N/A;  . NO PAST SURGERIES    . POLYPECTOMY  08/28/2015   Procedure: POLYPECTOMY;  Surgeon: Lucilla Lame, MD;  Location: [place];  Service: Endoscopy;;   Family History  Problem Relation Age of Onset  . Diabetes Paternal Grandfather   . Heart attack Maternal Grandmother   . Heart attack Maternal Grandfather   . Hypertension Mother   . Cataracts Mother   . Diabetes Father   . Coronary artery disease Father    No Known Allergies  Current Outpatient Medications:  .  atorvastatin (LIPITOR) 40 MG tablet, TAKE 1 TABLET BY MOUTH EVERY DAY, Disp: 90 tablet, Rfl: 2 .  cholecalciferol (VITAMIN D) 1000 UNITS tablet, Take 3 tablets by mouth daily. , Disp: , Rfl:  .  famotidine (PEPCID) 20 MG tablet, Take 1 tablet (20 mg total) by mouth 2 (two) times daily., Disp: 60 tablet, Rfl: 0 .  fluticasone (FLONASE) 50 MCG/ACT nasal spray, Place 2 sprays into both nostrils daily., Disp: 16 g, Rfl: 0 .  NIFEdipine (PROCARDIA-XL/NIFEDICAL-XL) 30 MG 24 hr tablet, TAKE 1 TABLET BY MOUTH EVERY DAY, Disp: 90 tablet, Rfl: 0 .  Omeprazole 20 MG TBEC, Take 1 tablet (20 mg total) by mouth daily., Disp: 30 each, Rfl: 6 .  vancomycin (VANCOCIN) 125 MG capsule, Take 1 capsule (125 mg total) by mouth  4 (four) times daily for 10 days., Disp: 40 capsule, Rfl: 0 .  ondansetron (ZOFRAN ODT) 4 MG disintegrating tablet, Take 1 tablet (4 mg total) by mouth every 8 (eight) hours as needed for nausea or vomiting. (Patient not taking: Reported on 11/15/2018), Disp: 20 tablet, Rfl: 0  Review of Systems  Constitutional: Negative for fever.  Cardiovascular: Negative for chest pain, palpitations and leg swelling.  Gastrointestinal: Negative for abdominal pain.   Social History   Tobacco Use  .  Smoking status: Former Smoker    Packs/day: 0.75    Years: 40.00    Pack years: 30.00    Types: Cigarettes  . Smokeless tobacco: Never Used  Substance Use Topics  . Alcohol use: No    Alcohol/week: 0.0 standard drinks    Comment: formerly was a heavy user     Objective:   BP 140/78 (BP Location: Right Arm, Patient Position: Sitting, Cuff Size: Large)   Pulse 77   Resp 16   Wt 154 lb 3.2 oz (69.9 kg)   SpO2 98%   BMI 24.89 kg/m  Vitals:   11/15/18 0810  BP: 140/78  Pulse: 77  Resp: 16  SpO2: 98%  Weight: 154 lb 3.2 oz (69.9 kg)   Physical Exam Constitutional:      General: He is not in acute distress.    Appearance: He is well-developed.  HENT:     Head: Normocephalic and atraumatic.     Right Ear: Hearing normal.     Left Ear: Hearing normal.     Nose: Nose normal.  Eyes:     General: Lids are normal. No scleral icterus.       Right eye: No discharge.        Left eye: No discharge.     Conjunctiva/sclera: Conjunctivae normal.  Neck:     Musculoskeletal: Normal range of motion and neck supple.  Cardiovascular:     Rate and Rhythm: Normal rate and regular rhythm.     Heart sounds: Normal heart sounds.  Pulmonary:     Effort: Pulmonary effort is normal. No respiratory distress.     Breath sounds: Normal breath sounds.  Abdominal:     General: Bowel sounds are normal. There is no distension.     Palpations: Abdomen is soft.     Tenderness: There is no abdominal tenderness. There is no guarding or rebound.  Musculoskeletal: Normal range of motion.  Skin:    Findings: No lesion or rash.  Neurological:     Mental Status: He is alert and oriented to person, place, and time.  Psychiatric:        Speech: Speech normal.        Behavior: Behavior normal.        Thought Content: Thought content normal.       Assessment & Plan    1. Diarrhea of infectious origin Hospitalized 11-09-18 and discharged 11-10-18 with diagnosis of diarrhea and weakness secondary to  C.difficile infection. Presently improved on the Vancomycin 125 mg QID (for 10 days) with probiotics and bland diet with increased fluid intake. No stomach cramps, nausea or vomiting. Home to rest and out of work until finished with antibiotic. Given contagion precautions and will check CBC with CMP. Follow up pending lab reports. - CBC with Differential/Platelet - Comprehensive metabolic panel     Vernie Murders, PA  [facility]

## 2018-11-15 ENCOUNTER — Encounter: Payer: Self-pay | Admitting: Family Medicine

## 2018-11-15 ENCOUNTER — Ambulatory Visit (INDEPENDENT_AMBULATORY_CARE_PROVIDER_SITE_OTHER): Payer: Self-pay | Admitting: Family Medicine

## 2018-11-15 VITALS — BP 140/78 | HR 77 | Resp 16 | Wt 154.2 lb

## 2018-11-15 DIAGNOSIS — A09 Infectious gastroenteritis and colitis, unspecified: Secondary | ICD-10-CM

## 2018-11-15 NOTE — Patient Instructions (Signed)
Clostridium Difficile Infection  Clostridium difficile (C. difficile or C. diff) infection is a condition that occurs when an overgrowth of C. diff bacteria causes irritation and swelling (inflammation) of the colon (colitis).  This infection can be passed from person to person (can be contagious). You may also be exposed to the bacteria from the environment, such as from food or water or from touching surfaces that have the bacteria on them.  What are the causes?  Certain bacteria normally live in the colon and help to digest food. This infection develops when the balance of bacteria in the colon is changed and C. diff grows out of control. This is often caused by taking antibiotics.  What increases the risk?  This condition is more likely to develop in people who:  · Need to take antibiotics for a long period of time.  · Take certain antibiotics that kill a wide range of bacteria.  · Are in the hospital.  · Are older.  · Live in a place where there is a lot of contact with others, such as a nursing home.  · Have had a C. diff infection before.  · Have a weak defense (immune) system.  · Take a medicine called proton pump inhibitors over a long period of time (chronic use).  · Have serious underlying conditions, such as colon cancer.  · Have had gastrointestinal (GI) tract procedure or surgery.  What are the signs or symptoms?  Symptoms of this condition include:  · Diarrhea. This may be bloody, watery, or yellow or green in color.  · Fever.  · Fatigue.  · Loss of appetite.  · Nausea.  · Swelling, pain, or tenderness in the abdomen.  How is this diagnosed?  This condition is diagnosed with medical history and physical exam. You may also have other tests, including:  · A test that checks for C. diff in your stool.  · Blood tests.  · Imaging tests, such as a CT scan of your abdomen.  In rare cases, a sigmoidoscopy or colonoscopy may be used to look directly into your colon. These procedures involve passing an  instrument through your rectum to look at the inside of your colon.  How is this treated?  Treatment for this condition includes:  · Stopping the antibiotics that you were on when the C. diff infection began. Do this only as told by your health care provider.  · Taking certain antibiotics to stop C. diff from growing.  · Taking donor stool from a healthy person and placing it into the colon (fecal transplant) through a colonoscope or nasogastric tube. This may be done if you have a recurrent infection.  · Having surgery to remove the infected part of the colon (rare).  Follow these instructions at home:  Eating and drinking    · Eat bland, easy-to-digest foods in small amounts as you are able. These foods include bananas, applesauce, rice, lean meats, toast, and crackers.  · Follow specific instructions on how to get enough water into your body (rehydrate). This may include:  ? Drinking clear fluids, such as water, ice chips, diluted fruit juice, and low-calorie sports drinks.  ? Taking an oral rehydration solution (ORS). This is a drink that is sold at pharmacies and retail stores.  · Avoid milk, caffeine, and alcohol.  · Drink enough fluid to keep your urine pale yellow.  General instructions  · Take over-the-counter and prescription medicines only as told by your health care provider.  ·   Take your antibiotic medicine as told by your health care provider. Do not stop taking the antibiotic, even if you start to feel better. You may stop taking it only if your health care provider tells you to stop.  · Wash your hands with soap and water. If soap and water are not available, use hand sanitizer.  · Do not use medicines to help with diarrhea.  · Keep all follow-up visits as told by your health care provider. This is important.  How is this prevented?  Hand hygiene    · Use soap and water to wash your hands thoroughly before you prepare food and after you use the bathroom. Make sure that people who live with you also  wash their hands often.  · If you are being treated at a hospital or clinic, make sure that all health care providers wash their hands with soap and water before touching you.  · If you are in the hospital, make sure that all visitors wash their hands with soap and water before touching you.  Contact precautions  · If you develop diarrhea while in the hospital or a long-term care facility, let your health care team know right away.  · When visiting someone in the hospital or a long-term care facility, follow guidelines for wearing a gown, gloves, or other protective equipment.  · If possible, avoid contact with people who have diarrhea.  Clean environment  · Clean surfaces with a product that contains chlorine bleach.  · If you are in the hospital, make sure that staff members clean the surfaces in your room daily. Let a staff person know right away if body fluids have splashed or spilled in your room.  Contact a health care provider if:  · Your symptoms do not get better with treatment.  · Your symptoms get worse, even with treatment.  · Your symptoms go away and then come back.  · You have a fever.  · You have new symptoms.  Get help right away if:  · You have more pain or tenderness in your abdomen.  · You have stool that is mostly bloody, or your stool looks dark black and tarry.  · You cannot eat or drink without vomiting.  · You have signs or symptoms of dehydration, such as:  ? Dark urine, very little urine, or no urine.  ? Cracked lips.  ? Not making tears when you cry.  ? Dry mouth.  ? Sunken eyes.  ? Sleepiness.  ? Weakness.  ? Dizziness.  Summary  · Clostridium difficile (C. difficile or C. diff) infection is a condition that occurs when an overgrowth of C. diff bacteria causes irritation and swelling (inflammation) of the colon.  · Symptoms of C. diff infection include diarrhea, fever, fatigue, loss of appetite, nausea, and swelling, pain, or tenderness in the abdomen.  · C. diff infection is treated by  stopping the antibiotics you were on when the C. diff infection began, and then taking certain antibiotics to keep C. diff from growing. In some cases, fecal transplant or surgery is performed.  · Hand washing with soap and water, contact precautions, and a clean environment can help prevent or limit the spread of C. diff infection.  This information is not intended to replace advice given to you by your health care provider. Make sure you discuss any questions you have with your health care provider.  Document Released: 07/20/2005 Document Revised: 07/19/2017 Document Reviewed: 07/19/2017  Elsevier Interactive Patient Education ©

## 2018-11-16 ENCOUNTER — Telehealth: Payer: Self-pay

## 2018-11-16 LAB — CBC WITH DIFFERENTIAL/PLATELET
Basophils Absolute: 0.1 10*3/uL (ref 0.0–0.2)
Basos: 1 %
EOS (ABSOLUTE): 0.1 10*3/uL (ref 0.0–0.4)
Eos: 1 %
Hematocrit: 42.8 % (ref 37.5–51.0)
Hemoglobin: 15.1 g/dL (ref 13.0–17.7)
Immature Grans (Abs): 0.1 10*3/uL (ref 0.0–0.1)
Immature Granulocytes: 1 %
Lymphocytes Absolute: 2.8 10*3/uL (ref 0.7–3.1)
Lymphs: 28 %
MCH: 30.9 pg (ref 26.6–33.0)
MCHC: 35.3 g/dL (ref 31.5–35.7)
MCV: 88 fL (ref 79–97)
Monocytes Absolute: 0.7 10*3/uL (ref 0.1–0.9)
Monocytes: 7 %
Neutrophils Absolute: 6.1 10*3/uL (ref 1.4–7.0)
Neutrophils: 62 %
Platelets: 282 10*3/uL (ref 150–450)
RBC: 4.88 x10E6/uL (ref 4.14–5.80)
RDW: 12.5 % (ref 11.6–15.4)
WBC: 9.8 10*3/uL (ref 3.4–10.8)

## 2018-11-16 LAB — COMPREHENSIVE METABOLIC PANEL
ALT: 30 IU/L (ref 0–44)
AST: 24 IU/L (ref 0–40)
Albumin/Globulin Ratio: 2.3 — ABNORMAL HIGH (ref 1.2–2.2)
Albumin: 4.9 g/dL (ref 3.8–4.9)
Alkaline Phosphatase: 92 IU/L (ref 39–117)
BUN/Creatinine Ratio: 13 (ref 9–20)
BUN: 11 mg/dL (ref 6–24)
Bilirubin Total: 0.3 mg/dL (ref 0.0–1.2)
CHLORIDE: 101 mmol/L (ref 96–106)
CO2: 22 mmol/L (ref 20–29)
Calcium: 9.9 mg/dL (ref 8.7–10.2)
Creatinine, Ser: 0.83 mg/dL (ref 0.76–1.27)
GFR calc Af Amer: 111 mL/min/{1.73_m2} (ref >59)
GFR calc non Af Amer: 96 mL/min/{1.73_m2} (ref >59)
Globulin, Total: 2.1 g/dL (ref 1.5–4.5)
Glucose: 107 mg/dL — ABNORMAL HIGH (ref 65–99)
Potassium: 4.4 mmol/L (ref 3.5–5.2)
Sodium: 139 mmol/L (ref 134–144)
Total Protein: 7 g/dL (ref 6.0–8.5)

## 2018-11-16 NOTE — Telephone Encounter (Signed)
-----   Message from Margo Common, [place] sent at 11/16/2018  8:38 AM EST ----- All blood tests are back to normal now. Finish all the Vancomycin and recheck in 2 weeks if not able to get back to normal activities.

## 2018-11-16 NOTE — Telephone Encounter (Signed)
Patient advised as directed below. 

## 2018-11-29 NOTE — Progress Notes (Deleted)
       Patient: Joshua Acevedo Male    DOB: 04/08/1961   58 y.o.   MRN: [id number] Visit Date: 11/29/2018  Today's Provider: Vernie Murders, PA   No chief complaint on file.  Subjective:     HPI  No Known Allergies   Current Outpatient Medications:  .  atorvastatin (LIPITOR) 40 MG tablet, TAKE 1 TABLET BY MOUTH EVERY DAY, Disp: 90 tablet, Rfl: 2 .  cholecalciferol (VITAMIN D) 1000 UNITS tablet, Take 3 tablets by mouth daily. , Disp: , Rfl:  .  famotidine (PEPCID) 20 MG tablet, Take 1 tablet (20 mg total) by mouth 2 (two) times daily., Disp: 60 tablet, Rfl: 0 .  fluticasone (FLONASE) 50 MCG/ACT nasal spray, Place 2 sprays into both nostrils daily., Disp: 16 g, Rfl: 0 .  NIFEdipine (PROCARDIA-XL/NIFEDICAL-XL) 30 MG 24 hr tablet, TAKE 1 TABLET BY MOUTH EVERY DAY, Disp: 90 tablet, Rfl: 0 .  Omeprazole 20 MG TBEC, Take 1 tablet (20 mg total) by mouth daily., Disp: 30 each, Rfl: 6 .  ondansetron (ZOFRAN ODT) 4 MG disintegrating tablet, Take 1 tablet (4 mg total) by mouth every 8 (eight) hours as needed for nausea or vomiting. (Patient not taking: Reported on 11/15/2018), Disp: 20 tablet, Rfl: 0  Review of Systems  Constitutional: Negative for appetite change, chills and fever.  Respiratory: Negative for chest tightness, shortness of breath and wheezing.   Cardiovascular: Negative for chest pain and palpitations.  Gastrointestinal: Negative for abdominal pain, nausea and vomiting.    Social History   Tobacco Use  . Smoking status: Former Smoker    Packs/day: 0.75    Years: 40.00    Pack years: 30.00    Types: Cigarettes  . Smokeless tobacco: Never Used  Substance Use Topics  . Alcohol use: No    Alcohol/week: 0.0 standard drinks    Comment: formerly was a heavy user      Objective:   There were no vitals taken for this visit. There were no vitals filed for this visit.   Physical Exam      Assessment & Plan        Vernie Murders, PA  [facility]

## 2018-11-30 ENCOUNTER — Ambulatory Visit: Payer: Self-pay | Admitting: Family Medicine

## 2018-12-20 ENCOUNTER — Other Ambulatory Visit: Payer: Self-pay | Admitting: Family Medicine

## 2018-12-20 DIAGNOSIS — E7800 Pure hypercholesterolemia, unspecified: Secondary | ICD-10-CM

## 2019-01-07 ENCOUNTER — Other Ambulatory Visit: Payer: Self-pay | Admitting: Family Medicine

## 2019-01-10 ENCOUNTER — Other Ambulatory Visit: Payer: Self-pay | Admitting: Family Medicine

## 2019-01-10 DIAGNOSIS — J0100 Acute maxillary sinusitis, unspecified: Secondary | ICD-10-CM

## 2019-01-10 MED ORDER — AMOXICILLIN 875 MG PO TABS: 875.0000 mg | ORAL_TABLET | Freq: Two times a day (BID) | ORAL | 0 refills | Status: DC

## 2019-01-10 NOTE — Progress Notes (Signed)
Having increase in sinus congestion with purulent thick mucus production over the past week. Had been irrigating sinuses twice a day. No fever. Recommend using antihistamine and nasal steroid spray. Given written Rx for Amoxil to use if fever develops.

## 2019-04-03 ENCOUNTER — Other Ambulatory Visit: Payer: Self-pay | Admitting: Family Medicine

## 2019-06-24 ENCOUNTER — Ambulatory Visit: Payer: Self-pay | Admitting: Family Medicine

## 2019-06-26 ENCOUNTER — Other Ambulatory Visit: Payer: Self-pay | Admitting: Family Medicine

## 2019-07-02 ENCOUNTER — Ambulatory Visit: Payer: Self-pay | Admitting: Family Medicine

## 2019-10-08 ENCOUNTER — Other Ambulatory Visit: Payer: Self-pay | Admitting: Family Medicine

## 2019-10-08 DIAGNOSIS — E7800 Pure hypercholesterolemia, unspecified: Secondary | ICD-10-CM

## 2019-10-11 ENCOUNTER — Other Ambulatory Visit: Payer: Self-pay | Admitting: Family Medicine

## 2019-12-05 ENCOUNTER — Ambulatory Visit: Payer: Self-pay

## 2019-12-05 NOTE — Telephone Encounter (Signed)
I returned his call.   His wife had called in earlier regarding him having chest pain.  He was at work when I called him.   He is c/o chest pain in his right ribcage that has been worse over the past 2 days.  "The pain moves around".  He denies shortness of breath, dizziness, breaking out in a sweat.    He does c/o neck and bilateral shoulder pain.    He has a strong history of heart disease on his mother's side of the family.  Two deaths from heart attacks.   There is a very strong history of diabetes on his father's side.    He has not been seen by a doctor for a while because he did not have insurance.   He does not.  I let him know he needed to go to the ED.   "I don't feel I need to go there".   "I don't know what my wife told y'all but I'm not having a heart attack".    "I'm just hurting in my right chest that has been worse over the last 2 days, that's all".     He is at work and said I need to get off of the phone.   I again encouraged him to go to the ED for evaluation and why.  I went over all the signs and symptoms to call 911 for.     "I really need to go".    "I'm at work and need to get off the phone".  The conversation was ended.   I sent my triage notes to Vernie Murders, PAs office so he would be aware of the conversation.   Reason for Disposition . [1] Chest pain (or "angina") comes and goes AND [2] is happening more often (increasing in frequency) or getting worse (increasing in severity)  Answer Assessment - Initial Assessment Questions 1. LOCATION: "Where does it hurt?"       I have not been seen in a while.    I didn't have any insurance.  I have insurance now. I don't have any chest pain today.   Denies shortness of breath or breaking out in a sweat.     It's in my right side rib cage that started yesterday.   The pain moves around.     2. RADIATION: "Does the pain go anywhere else?" (e.g., into neck, jaw, arms, back)     It hurts into my neck and shoulders along with  this pain that moves around.   3. ONSET: "When did the chest pain begin?" (Minutes, hours or days)      The neck and shoulder pain has been for the last 3-4 months.    The chest pain that is moving around has been going on for 6 months.   4. PATTERN "Does the pain come and go, or has it been constant since it started?"  "Does it get worse with exertion?"      Comes and goes 5. DURATION: "How long does it last" (e.g., seconds, minutes, hours)     It comes and goes    My mother's side has heart disease.   Diabetes bad on my father's side. 6. SEVERITY: "How bad is the pain?"  (e.g., Scale 1-10; mild, moderate, or severe)    - MILD (1-3): doesn't interfere with normal activities     - MODERATE (4-7): interferes with normal activities or awakens from sleep    - SEVERE (8-10): excruciating  pain, unable to do any normal activities       Yesterday and today the pain in my right side has been kind bad.  I spray paint resin.   I'm on my stomach and spraying over my head so I'm in all kinds of positions.   7. CARDIAC RISK FACTORS: "Do you have any history of heart problems or risk factors for heart disease?" (e.g., angina, prior heart attack; diabetes, high blood pressure, high cholesterol, smoker, or strong family history of heart disease)     On mother's side 8. PULMONARY RISK FACTORS: "Do you have any history of lung disease?"  (e.g., blood clots in lung, asthma, emphysema, birth control pills)     No blood clot history 9. CAUSE: "What do you think is causing the chest pain?"     No  I don't want to think about it honestly. 10. OTHER SYMPTOMS: "Do you have any other symptoms?" (e.g., dizziness, nausea, vomiting, sweating, fever, difficulty breathing, cough)       No dizziness no sicknesses.   No cough.    I do smoke.   11. PREGNANCY: "Is there any chance you are pregnant?" "When was your last menstrual period?"       N/A  Protocols used: CHEST PAIN-A-AH

## 2019-12-11 ENCOUNTER — Ambulatory Visit: Payer: Self-pay | Admitting: Family Medicine

## 2019-12-11 NOTE — Progress Notes (Deleted)
Patient: Joshua Acevedo Male    DOB: [birth year]   59 y.o.   MRN: [id number] Visit Date: 12/11/2019  Today's Provider: Wilhemena Durie, MD   No chief complaint on file.  Subjective:     HPI    Hypertension, follow-up:  BP Readings from Last 3 Encounters:  11/15/18 140/78  11/10/18 (!) 149/76  11/21/17 (!) 152/72    He was last seen for hypertension 11/21/2017.  BP at that visit was 152/72. Management since that visit includes; no changes.He reports {excellent/good/fair/poor:19665} compliance with treatment. He {ACTION; IS/IS GI:087931 having side effects. *** He {is/is not:9024} exercising. He {is/is not:9024} adherent to low salt diet.   Outside blood pressures are ***. He is experiencing {Symptoms; cardiac:12860}.  Patient denies {Symptoms; cardiac:12860}.   Cardiovascular risk factors include {cv risk factors:510}.  Use of agents associated with hypertension: {bp agents assoc with hypertension:511::"none"}.   ------------------------------------------------------------------------    Lipid/Cholesterol, Follow-up:   Last seen for this 11/21/2017.  Management since that visit includes; labs checked showing-Blood sugar and Hgb A1C elevated into prediabetes range. Total cholesterol and LDL high but better than a year ago. Advised to take Lipitor 40 mg everyday. Also advised low fat diet without alcohol/beer. Recheck in 3 months to be sure you are not progressing to full diabetic situation.  Last Lipid Panel:    Component Value Date/Time   CHOL 264 (H) 12/04/2017 0830   TRIG 93 12/04/2017 0830   HDL 43 12/04/2017 0830   CHOLHDL 6.1 (H) 12/04/2017 0830   LDLCALC 202 (H) 12/04/2017 0830    He reports {excellent/good/fair/poor:19665} compliance with treatment. He {ACTION; IS/IS GI:[id number] having side effects. ***  Wt Readings from Last 3 Encounters:  11/15/18 154 lb 3.2 oz (69.9 kg)  11/09/18 155 lb (70.3 kg)  11/21/17 157 lb (71.2 kg)     ------------------------------------------------------------------------    Mood swings From 11/21/2017-Irritability with stressful situations intermittently. Alprazolam 0.5 mg BID prn helps with control but finds there are times he may need it more often. Will check labs for metabolic disorder. May need to consider adding a little Abilify as a mood stabilizer. Patient prefers not to add any additional medications since he went through narcotic withdrawal (hydrocodone).  Tobacco use From 11/21/2017-Continues to 15 cigarettes a day on average. Has tried to stop "cold [place]" and Nicotine tablets without much success. Encouraged to taper down. He prefers to stop on his own without extra medications. Stopping will help hypertension.   No Known Allergies   Current Outpatient Medications:  .  amoxicillin (AMOXIL) 875 MG tablet, Take 1 tablet (875 mg total) by mouth 2 (two) times daily., Disp: 20 tablet, Rfl: 0 .  atorvastatin (LIPITOR) 40 MG tablet, TAKE 1 TABLET BY MOUTH EVERY DAY, Disp: 90 tablet, Rfl: 0 .  cholecalciferol (VITAMIN D) 1000 UNITS tablet, Take 3 tablets by mouth daily. , Disp: , Rfl:  .  famotidine (PEPCID) 20 MG tablet, Take 1 tablet (20 mg total) by mouth 2 (two) times daily., Disp: 60 tablet, Rfl: 0 .  fluticasone (FLONASE) 50 MCG/ACT nasal spray, Place 2 sprays into both nostrils daily., Disp: 16 g, Rfl: 0 .  NIFEdipine (PROCARDIA-XL/NIFEDICAL-XL) 30 MG 24 hr tablet, TAKE 1 TABLET BY MOUTH EVERY DAY, Disp: 90 tablet, Rfl: 0 .  Omeprazole 20 MG TBEC, Take 1 tablet (20 mg total) by mouth daily., Disp: 30 each, Rfl: 6 .  ondansetron (ZOFRAN ODT) 4 MG disintegrating tablet, Take 1 tablet (4 mg total) by  mouth every 8 (eight) hours as needed for nausea or vomiting. (Patient not taking: Reported on 11/15/2018), Disp: 20 tablet, Rfl: 0  Review of Systems  Constitutional: Negative for appetite change, chills and fever.  Respiratory: Negative for chest tightness, shortness of  breath and wheezing.   Cardiovascular: Negative for chest pain and palpitations.  Gastrointestinal: Negative for abdominal pain, nausea and vomiting.    Social History   Tobacco Use  . Smoking status: Former Smoker    Packs/day: 0.75    Years: 40.00    Pack years: 30.00    Types: Cigarettes  . Smokeless tobacco: Never Used  Substance Use Topics  . Alcohol use: No    Alcohol/week: 0.0 standard drinks    Comment: formerly was a heavy user      Objective:   There were no vitals taken for this visit. There were no vitals filed for this visit.There is no height or weight on file to calculate BMI.   Physical Exam   No results found for any visits on 12/11/19.     Assessment & Plan        Wilhemena Durie, MD  [facility]

## 2019-12-13 ENCOUNTER — Encounter: Payer: Self-pay | Admitting: Family Medicine

## 2019-12-13 ENCOUNTER — Ambulatory Visit (INDEPENDENT_AMBULATORY_CARE_PROVIDER_SITE_OTHER): Payer: 59 | Admitting: Family Medicine

## 2019-12-13 ENCOUNTER — Other Ambulatory Visit: Payer: Self-pay | Admitting: Family Medicine

## 2019-12-13 ENCOUNTER — Other Ambulatory Visit: Payer: Self-pay

## 2019-12-13 VITALS — BP 144/75 | HR 65 | Temp 97.1°F | Resp 16 | Ht 66.0 in | Wt 157.0 lb

## 2019-12-13 DIAGNOSIS — R52 Pain, unspecified: Secondary | ICD-10-CM

## 2019-12-13 DIAGNOSIS — E7800 Pure hypercholesterolemia, unspecified: Secondary | ICD-10-CM

## 2019-12-13 DIAGNOSIS — I10 Essential (primary) hypertension: Secondary | ICD-10-CM | POA: Diagnosis not present

## 2019-12-13 DIAGNOSIS — E663 Overweight: Secondary | ICD-10-CM

## 2019-12-13 DIAGNOSIS — Z720 Tobacco use: Secondary | ICD-10-CM

## 2019-12-13 NOTE — Progress Notes (Signed)
Patient: Joshua Acevedo Male    DOB: [birth year]   59 y.o.   MRN: [id number] Visit Date: 12/13/2019  Today's Provider: Vernie Murders, PA   Chief Complaint  Patient presents with  . Follow-up  . Hypertension  . Hyperlipidemia  . Gastroesophageal Reflux   Subjective:     HPI    Hypertension, follow-up:  BP Readings from Last 3 Encounters:  12/13/19 (!) 144/75  11/15/18 140/78  11/10/18 (!) 149/76    He was last seen for hypertension 11/21/2017.  BP at that visit was 152/72. Management since that visit includes. Labs checked, no changes.He reports fair compliance with treatment. He is not having side effects. none He is not exercising. He is adherent to low salt diet.   Outside blood pressures are not checking. He is experiencing none.  Patient denies chest pain, chest pressure/discomfort, claudication, dyspnea, exertional chest pressure/discomfort, fatigue, irregular heart beat, lower extremity edema, near-syncope, orthopnea, palpitations, paroxysmal nocturnal dyspnea, syncope and tachypnea.   Cardiovascular risk factors include advanced age (older than 48 for men, 73 for women), dyslipidemia and hypertension.  Use of agents associated with hypertension: none.   ----------------------------------------------------------    Lipid/Cholesterol, Follow-up:   Last seen for this 11/21/2017.  Management since that visit includes; labs checked showing-Blood sugar and Hgb A1C elevated into prediabetes range. Total cholesterol and LDL high but better than a year ago. Advised to be sure to take Lipitor 40 mg everyday. Also advised low fat diet without alcohol/beer. Recheck in 3 months to be sure you are not progressing to full diabetic situation.  Last Lipid Panel:    Component Value Date/Time   CHOL 264 (H) 12/04/2017 0830   TRIG 93 12/04/2017 0830   HDL 43 12/04/2017 0830   CHOLHDL 6.1 (H) 12/04/2017 0830   LDLCALC 202 (H) 12/04/2017 0830    He reports fair  compliance with treatment. He is not having side effects. none  Wt Readings from Last 3 Encounters:  12/13/19 157 lb (71.2 kg)  11/15/18 154 lb 3.2 oz (69.9 kg)  11/09/18 155 lb (70.3 kg)    -----------------------------------------------------------   Gastroesophageal reflux disease without esophagitis From 11/21/2017-Dyspepsia controlled well with Omeprazole once a day. No hematemesis or melena. May need to consider GI recheck with upper endoscopy (wants to postpone due to self-pay at the present).  Mood swings From 11/21/2017-Irritability with stressful situations intermittently. Alprazolam 0.5 mg BID prn helps with control but finds there are times he may need it more often. Will check labs for metabolic disorder. May need to consider adding a little Abilify as a mood stabilizer. Patient prefers not to add any additional medications since he went through narcotic withdrawal (hydrocodone). Past Medical History:  Diagnosis Date  . Arthritis    "everywhere"  . GERD (gastroesophageal reflux disease)   . Hepatitis 2009   "c" - treated  . Hepatitis C   . Hyperlipidemia   . Hypertension   . Shortness of breath dyspnea   . Wears dentures    partial upper   Past Surgical History:  Procedure Laterality Date  . COLONOSCOPY WITH PROPOFOL N/A 08/28/2015   Procedure: COLONOSCOPY WITH PROPOFOL;  Surgeon: Lucilla Lame, MD;  Location: [place];  Service: Endoscopy;  Laterality: N/A;  . NO PAST SURGERIES    . POLYPECTOMY  08/28/2015   Procedure: POLYPECTOMY;  Surgeon: Lucilla Lame, MD;  Location: [place];  Service: Endoscopy;;   Family History  Problem Relation Age of Onset  .  Diabetes Paternal Grandfather   . Heart attack Maternal Grandmother   . Heart attack Maternal Grandfather   . Hypertension Mother   . Cataracts Mother   . Diabetes Father   . Coronary artery disease Father    No Known Allergies  Current Outpatient Medications:  .  atorvastatin (LIPITOR)  40 MG tablet, TAKE 1 TABLET BY MOUTH EVERY DAY, Disp: 90 tablet, Rfl: 0 .  cholecalciferol (VITAMIN D) 1000 UNITS tablet, Take 3 tablets by mouth daily. , Disp: , Rfl:  .  fluticasone (FLONASE) 50 MCG/ACT nasal spray, Place 2 sprays into both nostrils daily., Disp: 16 g, Rfl: 0 .  NIFEdipine (PROCARDIA-XL/NIFEDICAL-XL) 30 MG 24 hr tablet, TAKE 1 TABLET BY MOUTH EVERY DAY, Disp: 90 tablet, Rfl: 0 .  Omeprazole 20 MG TBEC, Take 1 tablet (20 mg total) by mouth daily., Disp: 30 each, Rfl: 6 .  amoxicillin (AMOXIL) 875 MG tablet, Take 1 tablet (875 mg total) by mouth 2 (two) times daily. (Patient not taking: Reported on 12/13/2019), Disp: 20 tablet, Rfl: 0 .  famotidine (PEPCID) 20 MG tablet, Take 1 tablet (20 mg total) by mouth 2 (two) times daily. (Patient not taking: Reported on 12/13/2019), Disp: 60 tablet, Rfl: 0 .  ondansetron (ZOFRAN ODT) 4 MG disintegrating tablet, Take 1 tablet (4 mg total) by mouth every 8 (eight) hours as needed for nausea or vomiting. (Patient not taking: Reported on 11/15/2018), Disp: 20 tablet, Rfl: 0  Review of Systems  Constitutional: Negative for appetite change, chills and fever.  Respiratory: Negative for chest tightness, shortness of breath and wheezing.   Cardiovascular: Negative for chest pain and palpitations.  Gastrointestinal: Negative for abdominal pain, nausea and vomiting.    Social History   Tobacco Use  . Smoking status: Former Smoker    Packs/day: 0.75    Years: 40.00    Pack years: 30.00    Types: Cigarettes  . Smokeless tobacco: Never Used  Substance Use Topics  . Alcohol use: No    Alcohol/week: 0.0 standard drinks    Comment: formerly was a heavy user     Objective:   BP (!) 144/75 (BP Location: Right Arm, Patient Position: Sitting, Cuff Size: Large)   Pulse 65   Temp (!) 97.1 F (36.2 C) (Other (Comment))   Resp 16   Ht 5\' 6"  (1.676 m)   Wt 157 lb (71.2 kg)   SpO2 97%   BMI 25.34 kg/m  Vitals:   12/13/19 1331  BP: (!) 144/75    Pulse: 65  Resp: 16  Temp: (!) 97.1 F (36.2 C)  TempSrc: Other (Comment)  SpO2: 97%  Weight: 157 lb (71.2 kg)  Height: 5\' 6"  (1.676 m)  Body mass index is 25.34 kg/m. Wt Readings from Last 3 Encounters:  12/13/19 157 lb (71.2 kg)  11/15/18 154 lb 3.2 oz (69.9 kg)  11/09/18 155 lb (70.3 kg)    Physical Exam Constitutional:      Appearance: He is well-developed.  HENT:     Head: Normocephalic and atraumatic.     Right Ear: External ear normal.     Left Ear: External ear normal.     Nose: Nose normal.  Eyes:     General:        Right eye: No discharge.     Conjunctiva/sclera: Conjunctivae normal.     Pupils: Pupils are equal, round, and reactive to light.  Neck:     Thyroid: No thyromegaly.     Trachea: No tracheal deviation.  Cardiovascular:     Rate and Rhythm: Normal rate and regular rhythm.     Heart sounds: Normal heart sounds. No murmur.  Pulmonary:     Effort: Pulmonary effort is normal. No respiratory distress.     Breath sounds: Normal breath sounds. No wheezing or rales.  Chest:     Chest wall: No tenderness.  Abdominal:     General: There is no distension.     Palpations: Abdomen is soft. There is no mass.     Tenderness: There is no abdominal tenderness. There is no guarding or rebound.  Musculoskeletal:        General: No tenderness. Normal range of motion.     Cervical back: Normal range of motion and neck supple.  Lymphadenopathy:     Cervical: No cervical adenopathy.  Skin:    General: Skin is warm and dry.     Findings: No erythema or rash.  Neurological:     Mental Status: He is alert and oriented to person, place, and time.     Cranial Nerves: No cranial nerve deficit.     Motor: No abnormal muscle tone.     Coordination: Coordination normal.     Deep Tendon Reflexes: Reflexes are normal and symmetric. Reflexes normal.  Psychiatric:        Behavior: Behavior normal.        Thought Content: Thought content normal.        Judgment:  Judgment normal.    Depression screen [facility] 2/9 12/13/2019 11/22/2017 02/25/2016 10/06/2015 09/14/2015  Decreased Interest 0 0 0 0 0  Down, Depressed, Hopeless 1 0 0 0 0  PHQ - 2 Score 1 0 0 0 0  Altered sleeping - 0 - - -  Tired, decreased energy - 0 - - -  Change in appetite - 0 - - -  Feeling bad or failure about yourself  - 0 - - -  Trouble concentrating - 0 - - -  Moving slowly or fidgety/restless - 0 - - -  Suicidal thoughts - 0 - - -  PHQ-9 Score - 0 - - -  Difficult doing work/chores - Not difficult at all - - -       Assessment & Plan    1. Hypercholesteremia Tolerating Atorvastatin 40 mg qd but not following low fat diet. Should exercise by walking after work daily. Increase water intake and check follow up labs. Recheck pending reports. - TSH - Lipid panel - Comprehensive metabolic panel  2. Benign hypertension Fair control with continued use of Nifedipine 30 mg qd. No peripheral edema, chest pains, dyspnea or palpitations. Recheck CBC, CMP and TSH. - TSH - CBC with Differential/Platelet - Comprehensive metabolic panel  3. 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 May use Advil or Aleve and apply moist heat prn. Check CBC and CRP. - CBC with Differential/Platelet - C-reactive protein  4. Overweight with body mass index (BMI) 25.0-29.9 No good at limiting fats in diet or exercising because of heavy physical activities at work each day. Will check routine labs and recommend he walk for exercise 30 minutes after work daily.  - Hemoglobin A1c - TSH - Lipid panel - CBC with Differential/Platelet - Comprehensive metabolic panel  5. Tobacco use Still smoking 1 ppd. Has stopped intermittently, but usually goes back to it if stressed. Wants to quit but difficult because wife smokes heavily all the time  at home. No respiratory symptoms.      Vernie Murders, PA  [facility]

## 2019-12-14 LAB — COMPREHENSIVE METABOLIC PANEL
ALT: 23 IU/L (ref 0–44)
AST: 20 IU/L (ref 0–40)
Albumin/Globulin Ratio: 2.4 — ABNORMAL HIGH (ref 1.2–2.2)
Albumin: 5.1 g/dL — ABNORMAL HIGH (ref 3.8–4.9)
Alkaline Phosphatase: 104 IU/L (ref 39–117)
BUN/Creatinine Ratio: 15 (ref 10–24)
BUN: 14 mg/dL (ref 8–27)
Bilirubin Total: 0.4 mg/dL (ref 0.0–1.2)
CO2: 22 mmol/L (ref 20–29)
Calcium: 9.9 mg/dL (ref 8.6–10.2)
Chloride: 103 mmol/L (ref 96–106)
Creatinine, Ser: 0.94 mg/dL (ref 0.76–1.27)
GFR calc Af Amer: 101 mL/min/{1.73_m2} (ref >59)
GFR calc non Af Amer: 88 mL/min/{1.73_m2} (ref >59)
Globulin, Total: 2.1 g/dL (ref 1.5–4.5)
Glucose: 101 mg/dL — ABNORMAL HIGH (ref 65–99)
Potassium: 3.9 mmol/L (ref 3.5–5.2)
Sodium: 141 mmol/L (ref 134–144)
Total Protein: 7.2 g/dL (ref 6.0–8.5)

## 2019-12-14 LAB — C-REACTIVE PROTEIN: CRP: <1 mg/L (ref 0–10)

## 2019-12-14 LAB — CBC WITH DIFFERENTIAL/PLATELET
Basophils Absolute: 0.1 10*3/uL (ref 0.0–0.2)
Basos: 1 %
EOS (ABSOLUTE): 0.2 10*3/uL (ref 0.0–0.4)
Eos: 2 %
Hematocrit: 40.8 % (ref 37.5–51.0)
Hemoglobin: 14.6 g/dL (ref 13.0–17.7)
Immature Grans (Abs): 0 10*3/uL (ref 0.0–0.1)
Immature Granulocytes: 0 %
Lymphocytes Absolute: 3.6 10*3/uL — ABNORMAL HIGH (ref 0.7–3.1)
Lymphs: 45 %
MCH: 31.7 pg (ref 26.6–33.0)
MCHC: 35.8 g/dL — ABNORMAL HIGH (ref 31.5–35.7)
MCV: 89 fL (ref 79–97)
Monocytes Absolute: 0.7 10*3/uL (ref 0.1–0.9)
Monocytes: 8 %
Neutrophils Absolute: 3.5 10*3/uL (ref 1.4–7.0)
Neutrophils: 44 %
Platelets: 251 10*3/uL (ref 150–450)
RBC: 4.61 x10E6/uL (ref 4.14–5.80)
RDW: 12.1 % (ref 11.6–15.4)
WBC: 8.1 10*3/uL (ref 3.4–10.8)

## 2019-12-14 LAB — HEMOGLOBIN A1C
Est. average glucose Bld gHb Est-mCnc: 114 mg/dL
Hgb A1c MFr Bld: 5.6 % (ref 4.8–5.6)

## 2019-12-14 LAB — LIPID PANEL
Chol/HDL Ratio: 5.4 ratio — ABNORMAL HIGH (ref 0.0–5.0)
Cholesterol, Total: 253 mg/dL — ABNORMAL HIGH (ref 100–199)
HDL: 47 mg/dL (ref >39)
LDL Chol Calc (NIH): 184 mg/dL — ABNORMAL HIGH (ref 0–99)
Triglycerides: 123 mg/dL (ref 0–149)
VLDL Cholesterol Cal: 22 mg/dL (ref 5–40)

## 2019-12-14 LAB — TSH: TSH: 1.35 u[IU]/mL (ref 0.450–4.500)

## 2019-12-16 ENCOUNTER — Ambulatory Visit: Payer: Self-pay

## 2019-12-16 ENCOUNTER — Telehealth: Payer: Self-pay

## 2019-12-16 NOTE — Telephone Encounter (Signed)
Incoming call from Patient.  Requesting lab results. Provided  Lab results of Joshua Acevedo 12/16/19.  Patient voiced understanding.

## 2019-12-16 NOTE — Telephone Encounter (Signed)
Incoming call from Patient.  Provided lab results form Vernie Murders 2/22/21Patient voiced understanding.

## 2019-12-16 NOTE — Telephone Encounter (Signed)
-----   Message from [employer], [place] sent at 12/16/2019  8:08 AM EST ----- Blood sugar and Hgb A1C back in normal ranges. No sign of kidney or liver problems. Atorvastatin is starting to get cholesterol and risk ratio back down. Continue present meds and recheck progress in 6 months.

## 2019-12-16 NOTE — Telephone Encounter (Signed)
LMTCB, ok for PEC Triage to give results

## 2020-01-07 ENCOUNTER — Other Ambulatory Visit: Payer: Self-pay | Admitting: Family Medicine

## 2020-01-07 NOTE — Telephone Encounter (Signed)
Requested Prescriptions  Pending Prescriptions Disp Refills  . NIFEdipine (PROCARDIA-XL/NIFEDICAL-XL) 30 MG 24 hr tablet [Pharmacy Med Name: NIFEDIPINE ER 30 MG TABLET] 90 tablet 1    Sig: TAKE 1 TABLET BY MOUTH EVERY DAY     Cardiovascular:  Calcium Channel Blockers Failed - 01/07/2020  1:36 AM      Failed - Last BP in normal range    BP Readings from Last 1 Encounters:  12/13/19 (!) 144/75         Passed - Valid encounter within last 6 months    Recent Outpatient Visits          3 weeks ago [place], [place]   1 year ago Diarrhea of infectious origin   [facility], [place], [place]   2 years ago Benign hypertension   [facility], [place], [place]   2 years ago Benign hypertension   [place], [place]   3 years ago Numbness in [place], Kirstie Peri, MD

## 2020-01-10 ENCOUNTER — Encounter: Payer: Self-pay | Admitting: Family Medicine

## 2020-01-21 ENCOUNTER — Other Ambulatory Visit: Payer: Self-pay | Admitting: Family Medicine

## 2020-01-21 DIAGNOSIS — E7800 Pure hypercholesterolemia, unspecified: Secondary | ICD-10-CM

## 2020-01-21 NOTE — Telephone Encounter (Signed)
Requested Prescriptions  Pending Prescriptions Disp Refills  . atorvastatin (LIPITOR) 40 MG tablet [Pharmacy Med Name: ATORVASTATIN 40 MG TABLET] 90 tablet 0    Sig: TAKE 1 TABLET BY MOUTH EVERY DAY     Cardiovascular:  Antilipid - Statins Failed - 01/21/2020  1:34 AM      Failed - Total Cholesterol in normal range and within 360 days    Cholesterol, Total  Date Value Ref Range Status  12/13/2019 253 (H) 100 - 199 mg/dL Final         Failed - LDL in normal range and within 360 days    LDL Chol Calc (NIH)  Date Value Ref Range Status  12/13/2019 184 (H) 0 - 99 mg/dL Final         Passed - HDL in normal range and within 360 days    HDL  Date Value Ref Range Status  12/13/2019 47 >39 mg/dL Final         Passed - Triglycerides in normal range and within 360 days    Triglycerides  Date Value Ref Range Status  12/13/2019 123 0 - 149 mg/dL Final         Passed - Patient is not pregnant      Passed - Valid encounter within last 12 months    Recent Outpatient Visits          1 month ago [place], Vickki Muff, [place]   1 year ago Diarrhea of infectious origin   [facility], [place], [place]   2 years ago Benign hypertension   [facility], [place], [place]   2 years ago Benign hypertension   [facility], Vickki Muff, [place]   3 years ago Numbness in [place], Kirstie Peri, MD

## 2020-05-08 ENCOUNTER — Other Ambulatory Visit: Payer: Self-pay

## 2020-05-08 ENCOUNTER — Emergency Department: Payer: 59

## 2020-05-08 DIAGNOSIS — R079 Chest pain, unspecified: Secondary | ICD-10-CM | POA: Insufficient documentation

## 2020-05-08 DIAGNOSIS — E119 Type 2 diabetes mellitus without complications: Secondary | ICD-10-CM | POA: Diagnosis not present

## 2020-05-08 DIAGNOSIS — Z79899 Other long term (current) drug therapy: Secondary | ICD-10-CM | POA: Insufficient documentation

## 2020-05-08 DIAGNOSIS — Z87891 Personal history of nicotine dependence: Secondary | ICD-10-CM | POA: Diagnosis not present

## 2020-05-08 DIAGNOSIS — I10 Essential (primary) hypertension: Secondary | ICD-10-CM | POA: Diagnosis not present

## 2020-05-08 LAB — CBC
HCT: 41.5 % (ref 39.0–52.0)
Hemoglobin: 14.1 g/dL (ref 13.0–17.0)
MCH: 30.5 pg (ref 26.0–34.0)
MCHC: 34 g/dL (ref 30.0–36.0)
MCV: 89.6 fL (ref 80.0–100.0)
Platelets: 236 K/uL (ref 150–400)
RBC: 4.63 MIL/uL (ref 4.22–5.81)
RDW: 12.4 % (ref 11.5–15.5)
WBC: 8.3 K/uL (ref 4.0–10.5)
nRBC: 0 % (ref 0.0–0.2)

## 2020-05-08 MED ORDER — SODIUM CHLORIDE 0.9% FLUSH: 3.0000 mL | Freq: Once | INTRAVENOUS | Status: DC

## 2020-05-08 NOTE — ED Triage Notes (Signed)
Pt to the er for left upper chest pain since 1600. Pt denies sob, n/v with pain. Pt denies any hx of this type of chest pain. No hx of MI. Pt does take his meds for HTN.

## 2020-05-09 ENCOUNTER — Emergency Department
Admission: EM | Admit: 2020-05-09 | Discharge: 2020-05-09 | Disposition: A | Discharge status: Home / Self Care | Payer: 59 | Attending: Emergency Medicine | Admitting: Emergency Medicine

## 2020-05-09 DIAGNOSIS — R079 Chest pain, unspecified: Secondary | ICD-10-CM

## 2020-05-09 LAB — BASIC METABOLIC PANEL
Anion gap: 9 (ref 5–15)
BUN: 13 mg/dL (ref 6–20)
CO2: 27 mmol/L (ref 22–32)
Calcium: 9.3 mg/dL (ref 8.9–10.3)
Chloride: 104 mmol/L (ref 98–111)
Creatinine, Ser: 1.09 mg/dL (ref 0.61–1.24)
GFR calc Af Amer: >60 mL/min (ref >60)
GFR calc non Af Amer: >60 mL/min (ref >60)
Glucose, Bld: 110 mg/dL — ABNORMAL HIGH (ref 70–99)
Potassium: 3.9 mmol/L (ref 3.5–5.1)
Sodium: 140 mmol/L (ref 135–145)

## 2020-05-09 LAB — TROPONIN I (HIGH SENSITIVITY)
Troponin I (High Sensitivity): 5 ng/L (ref <18)
Troponin I (High Sensitivity): 5 ng/L (ref <18)

## 2020-05-09 MED ORDER — ASPIRIN 81 MG PO CHEW
324.0000 mg | CHEWABLE_TABLET | Freq: Once | ORAL | Status: AC
  Administered 2020-05-09: 324 mg via ORAL
  Filled 2020-05-09: qty 4

## 2020-05-09 NOTE — ED Provider Notes (Signed)
[facility] Emergency Department Provider Note  ____________________________________________   First MD Initiated Contact with Patient 05/09/20 [phone]     (approximate)  I have reviewed the triage vital signs and the nursing notes.   HISTORY  Chief Complaint Chest Pain    HPI Joshua Acevedo is a 59 y.o. male with medical history as listed below who presents for evaluation of acute onset dull left upper chest pain earlier tonight while at rest.  He is concerned because he has a strong family history with at least 2 grandparents having fatal heart attacks.  He also recently had a friend that was the same age as him who passed away from a heart attack.  The patient has never had chest pain and any known coronary artery disease.  He said that after he initially came to the emergency department the pain got worse and became more sharp but it has completely resolved at this time.  He had no shortness of breath, nausea, vomiting, nor sweating.  Nothing particular made the symptoms better or worse.  He has no history of blood clots in his legs or his lungs and he is quite active due to his job.  No unilateral leg pain or swelling.  He smokes and has other medical issues as listed below.  He does not take aspirin regularly but occasionally uses Goody powder.      Past Medical History:  Diagnosis Date  . Arthritis    "everywhere"  . GERD (gastroesophageal reflux disease)   . Hepatitis 2009   "c" - treated  . Hepatitis C   . Hyperlipidemia   . Hypertension   . Shortness of breath dyspnea   . Wears dentures    partial upper    Patient Active Problem List   Diagnosis Date Noted  . Benign neoplasm of descending colon   . Facet syndrome, lumbar 08/10/2015  . Fibromyalgia 06/25/2015  . Primary Raynaud's phenomenon 06/25/2015  . Arthralgia of multiple joints 03/16/2015  . Chronic pain associated with significant psychosocial dysfunction 03/16/2015  . Family history  of arthritis 03/16/2015  . Fatigue 03/16/2015  . Acid reflux 03/16/2015  . Gout 03/16/2015  . Hypercholesteremia 03/16/2015  . Benign hypertension 03/16/2015  . Borderline diabetes 03/16/2015  . Decreased libido 03/16/2015  . 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 Cannabis abuse 03/16/2015  . Changeable mood 03/16/2015  . Cramp of limb 03/16/2015  . Acquired trigger finger 03/16/2015  . Raynaud's syndrome 03/16/2015  . Vitamin D deficiency 03/16/2015  . Paroxysmal digital cyanosis 01/08/2014  . Breathlessness on exertion 12/15/2009  . Muscle ache 07/14/2009  . 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 LBP (low back pain) 05/15/2009  . Hepatitis C virus infection 06/02/2008  . Malaise and fatigue 05/01/2008    Past Surgical History:  Procedure Laterality Date  . COLONOSCOPY WITH PROPOFOL N/A 08/28/2015   Procedure: COLONOSCOPY WITH PROPOFOL;  Surgeon: Lucilla Lame, MD;  Location: [place];  Service: Endoscopy;  Laterality: N/A;  . NO PAST SURGERIES    . POLYPECTOMY  08/28/2015   Procedure: POLYPECTOMY;  Surgeon: Lucilla Lame, MD;  Location: [place];  Service: Endoscopy;;    Prior to Admission medications   Medication Sig Start Date End Date Taking? Authorizing Provider  atorvastatin (LIPITOR) 40 MG tablet TAKE 1 TABLET BY MOUTH EVERY DAY 01/21/20   Chrismon, Vickki Muff, PA  cholecalciferol (VITAMIN D) 1000 UNITS tablet Take 3 tablets by mouth daily.     [provider]  famotidine (PEPCID) 20 MG tablet Take 1 tablet (20 mg total) by mouth 2 (two) times daily. Patient not taking: Reported on 12/13/2019 11/09/18   Carrie Mew, MD  fluticasone [facility]) 50 MCG/ACT nasal spray Place 2 sprays into both nostrils daily. 09/23/16   Chrismon, Vickki Muff, PA  NIFEdipine (PROCARDIA-XL/NIFEDICAL-XL) 30 MG 24 hr tablet TAKE 1 TABLET BY MOUTH EVERY DAY 01/07/20   Chrismon, Vickki Muff, PA  Omeprazole 20 MG TBEC Take 1 tablet (20 mg total) by mouth daily. 11/21/17   Chrismon,  Vickki Muff, PA    Allergies Patient has no known allergies.  Family History  Problem Relation Age of Onset  . Diabetes Paternal Grandfather   . Heart attack Maternal Grandmother   . Heart attack Maternal Grandfather   . Hypertension Mother   . Cataracts Mother   . Diabetes Father   . Coronary artery disease Father     Social History Social History   Tobacco Use  . Smoking status: Former Smoker    Packs/day: 0.75    Years: 40.00    Pack years: 30.00    Types: Cigarettes  . Smokeless tobacco: Never Used  Substance Use Topics  . Alcohol use: No    Alcohol/week: 0.0 standard drinks    Comment: formerly was a heavy user  . Drug use: No    Comment: previously used marijuana, quit in 2009 when diagnosed with Hepatitis C    Review of Systems Constitutional: No fever/chills Eyes: No visual changes. ENT: No sore throat. Cardiovascular: +chest pain. Respiratory: Denies shortness of breath. Gastrointestinal: No abdominal pain.  No nausea, no vomiting.  No diarrhea.  No constipation. Genitourinary: Negative for dysuria. Musculoskeletal: Negative for neck pain.  Negative for back pain. Integumentary: Negative for rash. Neurological: Negative for headaches, focal weakness or numbness.   ____________________________________________   PHYSICAL EXAM:  VITAL SIGNS: ED Triage Vitals  Enc Vitals Group     BP 05/08/20 2334 (!) 154/68     Pulse Rate 05/08/20 2334 65     Resp 05/08/20 2334 18     Temp 05/08/20 2334 97.9 F (36.6 C)     Temp Source 05/08/20 2334 Oral     SpO2 05/08/20 2334 98 %     Weight 05/08/20 2326 70.8 kg (156 lb)     Height 05/08/20 2326 1.676 m (5\' 6" )     Head Circumference --      Peak Flow --      Pain Score 05/08/20 2326 3     Pain Loc --      Pain Edu? --      Excl. in [place]? --     Constitutional: Alert and oriented.  Eyes: Conjunctivae are normal.  Head: Atraumatic. Nose: No congestion/rhinnorhea. Mouth/Throat: Patient is wearing a  mask. Neck: No stridor.  No meningeal signs.   Cardiovascular: Normal rate, regular rhythm. Good peripheral circulation. Grossly normal heart sounds. Respiratory: Normal respiratory effort.  No retractions. Gastrointestinal: Soft and nontender. No distention.  Musculoskeletal: No lower extremity tenderness nor edema. No gross deformities of extremities. Neurologic:  Normal speech and language. No gross focal neurologic deficits are appreciated.  Skin:  Skin is warm, dry and intact. Psychiatric: Mood and affect are normal. Speech and behavior are normal.  ____________________________________________   LABS (all labs ordered are listed, but only abnormal results are displayed)  Labs Reviewed  BASIC METABOLIC PANEL - Abnormal; Notable for the following components:      Result Value   Glucose,  Bld 110 (*)    All other components within normal limits  CBC  TROPONIN I (HIGH SENSITIVITY)  TROPONIN I (HIGH SENSITIVITY)   ____________________________________________  EKG  ED ECG REPORT I, Hinda Kehr, the attending physician, personally viewed and interpreted this ECG.  Date: 05/08/2020 EKG Time: 23: 21 Rate: 64 Rhythm: normal sinus rhythm QRS Axis: normal Intervals: normal ST/T Wave abnormalities: normal Narrative Interpretation: no evidence of acute ischemia  ____________________________________________  RADIOLOGY I, Hinda Kehr, personally viewed and evaluated these images (plain radiographs) as part of my medical decision making, as well as reviewing the written report by the radiologist.  ED MD interpretation: No indication of acute abnormality.  Official radiology report(s): DG Chest 2 View  Result Date: 05/09/2020 CLINICAL DATA:  Chest pain EXAM: CHEST - 2 VIEW COMPARISON:  11/09/2018 FINDINGS: The heart size and mediastinal contours are within normal limits. Both lungs are clear. The visualized skeletal structures are unremarkable. IMPRESSION: No active  cardiopulmonary disease. Electronically Signed   By: Fidela Salisbury MD   On: 05/09/2020 00:05    ____________________________________________   PROCEDURES   Procedure(s) performed (including Critical Care):  .1-3 Lead EKG Interpretation Performed by: Hinda Kehr, MD Authorized by: Hinda Kehr, MD     Interpretation: normal     ECG rate:  66   ECG rate assessment: normal     Rhythm: sinus rhythm     Ectopy: none     Conduction: normal       ____________________________________________   INITIAL IMPRESSION / MDM / ASSESSMENT AND PLAN / ED COURSE  As part of my medical decision making, I reviewed the following data within the [facility] notes reviewed and incorporated, Labs reviewed , EKG interpreted , Old chart reviewed, Radiograph reviewed  and Notes from prior ED visits   Differential diagnosis includes, but is not limited to, musculoskeletal pain, ACS, acid reflux, PE, pneumonia.  The patient is on the cardiac monitor to evaluate for evidence of arrhythmia and/or significant heart rate changes.  EKG is reassuring.  2 high-sensitivity troponins are normal.  Basic metabolic panel, CBC, and vital signs are all within normal limits.  Chest x-ray was reviewed by me and shows no sign of acute abnormality.  Although the patient has several risk factors including family history and tobacco use, his work-up is generally reassuring tonight and I feel that he is appropriate for discharge and outpatient follow-up; he has a HEAR score of 2 and is low risk in spite of his risk factors.  I gave him a full dose aspirin and encouraged him to take baby aspirin although he should not use Goody powder as well and should only take ibuprofen according to label instructions.  He is comfortable with the plan for outpatient follow-up either with cardiology, which I provided, or with his primary care doctor.  I gave my usual and customary return precautions.        ____________________________________________  FINAL CLINICAL IMPRESSION(S) / ED DIAGNOSES  Final diagnoses:  Chest pain, unspecified type     MEDICATIONS GIVEN DURING THIS VISIT:  Medications  sodium chloride flush (NS) 0.9 % injection 3 mL (has no administration in time range)  aspirin chewable tablet 324 mg (has no administration in time range)     ED Discharge Orders    None      *Please note:  WILLET SCHLEIFER was evaluated in Emergency Department on 05/09/2020 for the symptoms described in the history of present illness. He was  evaluated in the context of the global COVID-19 pandemic, which necessitated consideration that the patient might be at risk for infection with the SARS-CoV-2 virus that causes COVID-19. Institutional protocols and algorithms that pertain to the evaluation of patients at risk for COVID-19 are in a state of rapid change based on information released by regulatory bodies including the CDC and federal and state organizations. These policies and algorithms were followed during the patient's care in the ED.  Some ED evaluations and interventions may be delayed as a result of limited staffing during and after the pandemic.*  Note:  This document was prepared using Dragon voice recognition software and may include unintentional dictation errors.   Hinda Kehr, MD 05/09/20 [phone]

## 2020-05-09 NOTE — Discharge Instructions (Addendum)

## 2020-05-19 DIAGNOSIS — R079 Chest pain, unspecified: Secondary | ICD-10-CM | POA: Insufficient documentation

## 2020-07-15 ENCOUNTER — Other Ambulatory Visit: Payer: Self-pay | Admitting: Family Medicine

## 2020-07-15 NOTE — Telephone Encounter (Signed)
Requested Prescriptions  Pending Prescriptions Disp Refills  . NIFEdipine (PROCARDIA-XL/NIFEDICAL-XL) 30 MG 24 hr tablet [Pharmacy Med Name: NIFEDIPINE ER 30 MG TABLET] 30 tablet 0    Sig: TAKE 1 TABLET BY MOUTH EVERY DAY     Cardiovascular:  Calcium Channel Blockers Failed - 07/15/2020  1:16 AM      Failed - Last BP in normal range    BP Readings from Last 1 Encounters:  05/09/20 (!) 158/76         Failed - Valid encounter within last 6 months    Recent Outpatient Visits          7 months ago [place], [place]   1 year ago Diarrhea of infectious origin   [facility], [place], [place]   2 years ago Benign hypertension   [facility], [place], [place]   3 years ago Benign hypertension   [facility], Vickki Muff, [place]   4 years ago Numbness in [place], Kirstie Peri, MD             One month courtesy refill with reminder for patient to call and schedule 6 month follow-up appointment.

## 2020-07-24 ENCOUNTER — Ambulatory Visit (INDEPENDENT_AMBULATORY_CARE_PROVIDER_SITE_OTHER): Payer: 59 | Admitting: Family Medicine

## 2020-07-24 ENCOUNTER — Encounter: Payer: Self-pay | Admitting: Family Medicine

## 2020-07-24 ENCOUNTER — Other Ambulatory Visit: Payer: Self-pay

## 2020-07-24 VITALS — BP 152/71 | HR 55 | Temp 98.0°F | Resp 15 | Wt 157.0 lb

## 2020-07-24 DIAGNOSIS — G479 Sleep disorder, unspecified: Secondary | ICD-10-CM | POA: Diagnosis not present

## 2020-07-24 MED ORDER — TRAZODONE HCL 50 MG PO TABS: 25.0000 mg | ORAL_TABLET | Freq: Every evening | ORAL | 3 refills | Status: DC | PRN

## 2020-07-24 NOTE — Patient Instructions (Signed)

## 2020-07-24 NOTE — Progress Notes (Signed)
Established patient visit   Patient: Joshua Acevedo   DOB: [birth year]   59 y.o. Male  MRN: [id number] Visit Date: 07/24/2020  Today's healthcare provider: Vernie Murders, PA   Chief Complaint  Patient presents with  . Insomnia   Subjective    Insomnia Primary symptoms: fragmented sleep, difficulty falling asleep, malaise/fatigue.  The current episode started one month. The onset quality is sudden. The problem occurs nightly. The problem has been gradually worsening since onset. Treatments tried: melatnonin. The treatment provided no relief. Typical bedtime:  8-10 P.M..  PMH includes: hypertension. Prior diagnostic workup includes:  No prior workup.      Patient Active Problem List   Diagnosis Date Noted  . Benign neoplasm of descending colon   . Facet syndrome, lumbar 08/10/2015  . Fibromyalgia 06/25/2015  . Primary Raynaud's phenomenon 06/25/2015  . Arthralgia of multiple joints 03/16/2015  . Chronic pain associated with significant psychosocial dysfunction 03/16/2015  . Family history of arthritis 03/16/2015  . Fatigue 03/16/2015  . Acid reflux 03/16/2015  . Gout 03/16/2015  . Hypercholesteremia 03/16/2015  . Benign hypertension 03/16/2015  . Borderline diabetes 03/16/2015  . Decreased libido 03/16/2015  . 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 Cannabis abuse 03/16/2015  . Changeable mood 03/16/2015  . Cramp of limb 03/16/2015  . Acquired trigger finger 03/16/2015  . Raynaud's syndrome 03/16/2015  . Vitamin D deficiency 03/16/2015  . Paroxysmal digital cyanosis 01/08/2014  . Breathlessness on exertion 12/15/2009  . Muscle ache 07/14/2009  . 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 LBP (low back pain) 05/15/2009  . Hepatitis C virus infection 06/02/2008  . Malaise and fatigue 05/01/2008   Past Medical History:  Diagnosis Date  . Arthritis    "everywhere"  . GERD (gastroesophageal reflux disease)   . Hepatitis 2009   "c" - treated  . Hepatitis C   .  Hyperlipidemia   . Hypertension   . Shortness of breath dyspnea   . Wears dentures    partial upper   No Known Allergies    Family History  Problem Relation Age of Onset  . Diabetes Paternal Grandfather   . Heart attack Maternal Grandmother   . Heart attack Maternal Grandfather   . Hypertension Mother   . Cataracts Mother   . Diabetes Father   . Coronary artery disease Father    Social History   Tobacco Use  . Smoking status: Former Smoker    Packs/day: 0.75    Years: 40.00    Pack years: 30.00    Types: Cigarettes  . Smokeless tobacco: Never Used  Substance Use Topics  . Alcohol use: No    Alcohol/week: 0.0 standard drinks    Comment: formerly was a heavy user  . Drug use: No    Comment: previously used marijuana, quit in 2009 when diagnosed with Hepatitis C    Medications: Outpatient Medications Prior to Visit  Medication Sig  . atorvastatin (LIPITOR) 40 MG tablet TAKE 1 TABLET BY MOUTH EVERY DAY  . cholecalciferol (VITAMIN D) 1000 UNITS tablet Take 3 tablets by mouth daily.   . famotidine (PEPCID) 20 MG tablet Take 1 tablet (20 mg total) by mouth 2 (two) times daily. (Patient not taking: Reported on 12/13/2019)  . fluticasone (FLONASE) 50 MCG/ACT nasal spray Place 2 sprays into both nostrils daily.  Marland Kitchen NIFEdipine (PROCARDIA-XL/NIFEDICAL-XL) 30 MG 24 hr tablet TAKE 1 TABLET BY MOUTH EVERY DAY  . Omeprazole 20 MG TBEC Take 1 tablet (20 mg total) by  mouth daily.   No facility-administered medications prior to visit.    Review of Systems  Constitutional: Positive for malaise/fatigue.  Psychiatric/Behavioral: The patient has insomnia.       Objective    BP (!) 152/71   Pulse (!) 55   Temp 98 F (36.7 C) (Oral)   Resp 15   Wt 157 lb (71.2 kg)   SpO2 100%   BMI 25.34 kg/m    Physical Exam Constitutional:      General: He is not in acute distress.    Appearance: He is well-developed.  HENT:     Head: Normocephalic and atraumatic.     Right Ear:  Hearing normal.     Left Ear: Hearing normal.     Nose: Nose normal.  Eyes:     General: Lids are normal. No scleral icterus.       Right eye: No discharge.        Left eye: No discharge.     Conjunctiva/sclera: Conjunctivae normal.  Pulmonary:     Effort: Pulmonary effort is normal. No respiratory distress.  Musculoskeletal:        General: Normal range of motion.  Skin:    Findings: No lesion or rash.  Neurological:     Mental Status: He is alert and oriented to person, place, and time.  Psychiatric:        Speech: Speech normal.        Behavior: Behavior normal.        Thought Content: Thought content normal.       No results found for any visits on 07/24/20.  Assessment & Plan     1. Sleep disturbance Having fragmented sleep and anxiety with some irritability over the past several months. Will treat with Trazodone 25-50 mg hs and recheck prn.   No follow-ups on file.         Vernie Murders, [place] [phone] (phone) [phone] (fax)  [place]

## 2020-08-10 ENCOUNTER — Other Ambulatory Visit: Payer: Self-pay | Admitting: Family Medicine

## 2020-08-15 ENCOUNTER — Other Ambulatory Visit: Payer: Self-pay | Admitting: Family Medicine

## 2020-08-15 DIAGNOSIS — G479 Sleep disorder, unspecified: Secondary | ICD-10-CM

## 2020-08-15 NOTE — Telephone Encounter (Signed)
Requested Prescriptions  Pending Prescriptions Disp Refills  . traZODone (DESYREL) 50 MG tablet [Pharmacy Med Name: TRAZODONE 50 MG TABLET] 90 tablet 0    Sig: TAKE 1/2 TO 1 TABLET (25-50 MG TOTAL) BY MOUTH AT BEDTIME AS NEEDED FOR SLEEP.     Psychiatry: Antidepressants - Serotonin Modulator Passed - 08/15/2020 12:32 PM      Passed - Valid encounter within last 6 months    Recent Outpatient Visits          3 weeks ago Sleep disturbance   [place], [place]   8 months ago [place], [place], [place]   1 year ago Diarrhea of infectious origin   [facility], [place], [place]   2 years ago Benign hypertension   [facility], [place], [place]   3 years ago Benign hypertension   [facility], [place], [place]

## 2020-09-08 ENCOUNTER — Other Ambulatory Visit: Payer: Self-pay | Admitting: Family Medicine

## 2020-09-28 ENCOUNTER — Ambulatory Visit (INDEPENDENT_AMBULATORY_CARE_PROVIDER_SITE_OTHER): Payer: 59 | Admitting: Family Medicine

## 2020-09-28 ENCOUNTER — Encounter: Payer: Self-pay | Admitting: Family Medicine

## 2020-09-28 ENCOUNTER — Other Ambulatory Visit: Payer: Self-pay

## 2020-09-28 VITALS — BP 148/72 | HR 79 | Temp 98.2°F | Resp 15 | Wt 163.0 lb

## 2020-09-28 DIAGNOSIS — G479 Sleep disorder, unspecified: Secondary | ICD-10-CM | POA: Diagnosis not present

## 2020-09-28 DIAGNOSIS — R4586 Emotional lability: Secondary | ICD-10-CM | POA: Diagnosis not present

## 2020-09-28 MED ORDER — OLANZAPINE 2.5 MG PO TABS: 2.5000 mg | ORAL_TABLET | Freq: Every day | ORAL | 2 refills | Status: DC

## 2020-09-28 NOTE — Progress Notes (Signed)
Established patient visit   Patient: Joshua Acevedo   DOB: February 01, 1961   59 y.o. Male  MRN: [id number] Visit Date: 09/28/2020  Today's healthcare provider: Vernie Murders, PA   Chief Complaint  Patient presents with  . Insomnia   Subjective    HPI  Follow up for sleep distrubance  The patient was last seen for this 2 months ago. Changes made at last visit include patient was started on Trazodone 08/15/20. Patient states that he discontinued medication because he did not feel it was helping with symptoms.   He reports poor compliance with treatment. He feels that condition is Unchanged. He is not having side effects.   -----------------------------------------------------------------------------------------   Patient Active Problem List   Diagnosis Date Noted  . Benign neoplasm of descending colon   . Facet syndrome, lumbar 08/10/2015  . Fibromyalgia 06/25/2015  . Primary Raynaud's phenomenon 06/25/2015  . Arthralgia of multiple joints 03/16/2015  . Chronic pain associated with significant psychosocial dysfunction 03/16/2015  . Family history of arthritis 03/16/2015  . Fatigue 03/16/2015  . Acid reflux 03/16/2015  . Gout 03/16/2015  . Hypercholesteremia 03/16/2015  . Benign hypertension 03/16/2015  . Borderline diabetes 03/16/2015  . Decreased libido 03/16/2015  . 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 Cannabis abuse 03/16/2015  . Changeable mood 03/16/2015  . Cramp of limb 03/16/2015  . Acquired trigger finger 03/16/2015  . Raynaud's syndrome 03/16/2015  . Vitamin D deficiency 03/16/2015  . Paroxysmal digital cyanosis 01/08/2014  . Breathlessness on exertion 12/15/2009  . Muscle ache 07/14/2009  . 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 LBP (low back pain) 05/15/2009  . Hepatitis C virus infection 06/02/2008  . Malaise and fatigue 05/01/2008   Past Medical History:  Diagnosis Date  . Arthritis    "everywhere"  . GERD (gastroesophageal reflux disease)   .  Hepatitis 2009   "c" - treated  . Hepatitis C   . Hyperlipidemia   . Hypertension   . Shortness of breath dyspnea   . Wears dentures    partial upper   No Known Allergies     Social History   Tobacco Use  . Smoking status: Former Smoker    Packs/day: 0.75    Years: 40.00    Pack years: 30.00    Types: Cigarettes  . Smokeless tobacco: Never Used  Substance Use Topics  . Alcohol use: No    Alcohol/week: 0.0 standard drinks    Comment: formerly was a heavy user  . Drug use: No    Comment: previously used marijuana, quit in 2009 when diagnosed with Hepatitis C   Family History  Problem Relation Age of Onset  . Diabetes Paternal Grandfather   . Heart attack Maternal Grandmother   . Heart attack Maternal Grandfather   . Hypertension Mother   . Cataracts Mother   . Diabetes Father   . Coronary artery disease Father    Medications: Outpatient Medications Prior to Visit  Medication Sig  . atorvastatin (LIPITOR) 40 MG tablet TAKE 1 TABLET BY MOUTH EVERY DAY  . cholecalciferol (VITAMIN D) 1000 UNITS tablet Take 3 tablets by mouth daily.   . famotidine (PEPCID) 20 MG tablet Take 1 tablet (20 mg total) by mouth 2 (two) times daily. (Patient not taking: Reported on 12/13/2019)  . fluticasone (FLONASE) 50 MCG/ACT nasal spray Place 2 sprays into both nostrils daily.  Marland Kitchen NIFEdipine (PROCARDIA-XL/NIFEDICAL-XL) 30 MG 24 hr tablet TAKE 1 TABLET BY MOUTH EVERY DAY  . Omeprazole 20 MG  TBEC Take 1 tablet (20 mg total) by mouth daily.  . traZODone (DESYREL) 50 MG tablet TAKE 1/2 TO 1 TABLET (25-50 MG TOTAL) BY MOUTH AT BEDTIME AS NEEDED FOR SLEEP.   No facility-administered medications prior to visit.    Review of Systems    Objective    BP (!) 148/72   Pulse 79   Temp 98.2 F (36.8 C) (Oral)   Resp 15   Wt 163 lb (73.9 kg)   SpO2 96%   BMI 26.31 kg/m    Physical Exam Constitutional:      General: He is not in acute distress.    Appearance: He is well-developed.  HENT:      Head: Normocephalic and atraumatic.     Right Ear: Hearing normal.     Left Ear: Hearing normal.     Nose: Nose normal.  Eyes:     General: Lids are normal. No scleral icterus.       Right eye: No discharge.        Left eye: No discharge.     Conjunctiva/sclera: Conjunctivae normal.  Cardiovascular:     Rate and Rhythm: Regular rhythm.     Heart sounds: Normal heart sounds.  Pulmonary:     Effort: Pulmonary effort is normal. No respiratory distress.  Abdominal:     General: Bowel sounds are normal.     Palpations: Abdomen is soft.  Musculoskeletal:        General: Normal range of motion.  Skin:    Findings: No lesion or rash.  Neurological:     Mental Status: He is alert and oriented to person, place, and time.  Psychiatric:        Attention and Perception: Attention normal.        Mood and Affect: Affect is angry.        Speech: Speech normal.        Behavior: Behavior normal.        Thought Content: Thought content normal.       No results found for any visits on 09/28/20.  Assessment & Plan     1. Sleep disturbance Not having improvement with sleep pattern after trying the Trazodone. Will give trial of Zyprexa for mood stabilization and better sleep pattern. Recommend exercise for 30-40 minutes 3-4 days a week and a good regular diet. Recheck if no better in 2-4 weeks. - OLANZapine (ZYPREXA) 2.5 MG tablet; Take 1 tablet (2.5 mg total) by mouth at bedtime.  Dispense: 30 tablet; Refill: 2  2. Mood swings Still has some irritability issues. Can fall asleep in a recliner in the early evening. Sleep for a couple hours then wake up to drink water and get something to eat. When irritable, wife has offered him some of her Clonazepam and sleeps better. Does not want to go back on any benzodiazepines with the difficulty he had getting off them in the past. Will give trial of Zyprexa to stabilize mood and try to help with sleep.  - OLANZapine (ZYPREXA) 2.5 MG tablet; Take 1  tablet (2.5 mg total) by mouth at bedtime.  Dispense: 30 tablet; Refill: 2   No follow-ups on file.      Andres Shad, PA, have reviewed all documentation for this visit. The documentation on 09/28/20 for the exam, diagnosis, procedures, and orders are all accurate and complete.    Vernie Murders, [place] [phone] (phone) [phone] (fax)  [place]

## 2020-10-13 ENCOUNTER — Ambulatory Visit (INDEPENDENT_AMBULATORY_CARE_PROVIDER_SITE_OTHER): Payer: 59 | Admitting: Family Medicine

## 2020-10-13 ENCOUNTER — Encounter: Payer: Self-pay | Admitting: Family Medicine

## 2020-10-13 ENCOUNTER — Ambulatory Visit
Admission: RE | Admit: 2020-10-13 | Discharge: 2020-10-13 | Disposition: A | Discharge status: Home / Self Care | Payer: 59 | Attending: Family Medicine | Admitting: Family Medicine

## 2020-10-13 ENCOUNTER — Other Ambulatory Visit: Payer: Self-pay

## 2020-10-13 ENCOUNTER — Ambulatory Visit
Admission: RE | Admit: 2020-10-13 | Discharge: 2020-10-13 | Disposition: A | Discharge status: Home / Self Care | Payer: 59 | Source: Ambulatory Visit | Attending: Family Medicine | Admitting: Family Medicine

## 2020-10-13 VITALS — BP 152/94 | HR 86 | Ht 66.0 in | Wt 162.0 lb

## 2020-10-13 DIAGNOSIS — R0781 Pleurodynia: Secondary | ICD-10-CM | POA: Diagnosis not present
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
## 2020-10-22 ENCOUNTER — Ambulatory Visit: Payer: Self-pay

## 2020-10-23 ENCOUNTER — Other Ambulatory Visit: Payer: Self-pay | Admitting: Family Medicine

## 2020-10-23 DIAGNOSIS — R4586 Emotional lability: Secondary | ICD-10-CM

## 2020-10-23 DIAGNOSIS — G479 Sleep disorder, unspecified: Secondary | ICD-10-CM

## 2020-10-26 NOTE — Telephone Encounter (Signed)
   Notes to clinic:  requesting a 90 day supply   Requested Prescriptions  Pending Prescriptions Disp Refills   OLANZapine (ZYPREXA) 2.5 MG tablet [Pharmacy Med Name: OLANZAPINE 2.5 MG TABLET] 90 tablet 1    Sig: TAKE 1 TABLET BY MOUTH AT BEDTIME.      Not Delegated - Psychiatry:  Antipsychotics - Second Generation (Atypical) - olanzapine Failed - 10/23/2020  2:34 PM      Failed - This refill cannot be delegated      Failed - Last BP in normal range    BP Readings from Last 1 Encounters:  10/13/20 (!) 152/94          Passed - ALT in normal range and within 360 days    ALT  Date Value Ref Range Status  12/13/2019 23 0 - 44 IU/L Final   SGPT (ALT)  Date Value Ref Range Status  08/22/2012 53 12 - 78 U/L Final          Passed - AST in normal range and within 360 days    AST  Date Value Ref Range Status  12/13/2019 20 0 - 40 IU/L Final   SGOT(AST)  Date Value Ref Range Status  08/22/2012 22 15 - 37 Unit/L Final          Passed - Valid encounter within last 6 months    Recent Outpatient Visits           1 week ago Rib pain on right side   [facility] [place], Jodell Cipro, [name]   4 weeks ago Sleep disturbance   [facility], Jodell Cipro, [name]   3 months ago Sleep disturbance   [facility], Jodell Cipro, [name]   10 months ago Hypercholesteremia   [facility], Jodell Cipro, [name]   1 year ago Diarrhea of infectious origin   [facility], Jodell Cipro, [place]

## 2021-03-13 ENCOUNTER — Other Ambulatory Visit: Payer: Self-pay | Admitting: Family Medicine

## 2021-03-13 NOTE — Telephone Encounter (Signed)
Patient will need an office visit for further refills. Requested Prescriptions  Pending Prescriptions Disp Refills  . NIFEdipine (PROCARDIA-XL/NIFEDICAL-XL) 30 MG 24 hr tablet [Pharmacy Med Name: NIFEDIPINE ER 30 MG TABLET] 90 tablet 0    Sig: TAKE 1 TABLET BY MOUTH EVERY DAY     Cardiovascular:  Calcium Channel Blockers Failed - 03/13/2021 12:51 AM      Failed - Last BP in normal range    BP Readings from Last 1 Encounters:  10/13/20 (!) 152/94         Passed - Valid encounter within last 6 months    Recent Outpatient Visits          5 months ago Rib pain on right side   [place], [name]   5 months ago Sleep disturbance   [place], [name]   7 months ago Sleep disturbance   [facility], Vickki Muff, [name]   1 year ago [place], Vickki Muff, [name]   2 years ago Diarrhea of infectious origin   [facility], Vickki Muff, [place]

## 2021-04-06 ENCOUNTER — Telehealth: Payer: Self-pay

## 2021-04-06 ENCOUNTER — Telehealth: Payer: 59 | Admitting: Physician Assistant

## 2021-04-06 DIAGNOSIS — J329 Chronic sinusitis, unspecified: Secondary | ICD-10-CM | POA: Diagnosis not present

## 2021-04-06 DIAGNOSIS — B9689 Other specified bacterial agents as the cause of diseases classified elsewhere: Secondary | ICD-10-CM

## 2021-04-06 MED ORDER — AMOXICILLIN-POT CLAVULANATE 875-125 MG PO TABS: 1.0000 | ORAL_TABLET | Freq: Two times a day (BID) | ORAL | 0 refills | Status: DC

## 2021-04-06 NOTE — Progress Notes (Signed)
We are sorry that you are not feeling well.  Here is how we plan to help!  Based on what you have shared with me it looks like you have sinusitis.  Sinusitis is inflammation and infection in the sinus cavities of the head.  Based on your presentation I believe you most likely have Acute Bacterial Sinusitis.  This is an infection caused by bacteria and is treated with antibiotics. I have prescribed Augmentin 875mg/125mg one tablet twice daily with food, for 7 days. You may use an oral decongestant such as Mucinex D or if you have glaucoma or high blood pressure use plain Mucinex. Saline nasal spray help and can safely be used as often as needed for congestion.  If you develop worsening sinus pain, fever or notice severe headache and vision changes, or if symptoms are not better after completion of antibiotic, please schedule an appointment with a health care provider.    Sinus infections are not as easily transmitted as other respiratory infection, however we still recommend that you avoid close contact with loved ones, especially the very young and elderly.  Remember to wash your hands thoroughly throughout the day as this is the number one way to prevent the spread of infection!  Home Care:  Only take medications as instructed by your medical team.  Complete the entire course of an antibiotic.  Do not take these medications with alcohol.  A steam or ultrasonic humidifier can help congestion.  You can place a towel over your head and breathe in the steam from hot water coming from a faucet.  Avoid close contacts especially the very young and the elderly.  Cover your mouth when you cough or sneeze.  Always remember to wash your hands.  Get Help Right Away If:  You develop worsening fever or sinus pain.  You develop a severe head ache or visual changes.  Your symptoms persist after you have completed your treatment plan.  Make sure you  Understand these instructions.  Will watch your  condition.  Will get help right away if you are not doing well or get worse.  Your e-visit answers were reviewed by a board certified advanced clinical practitioner to complete your personal care plan.  Depending on the condition, your plan could have included both over the counter or prescription medications.  If there is a problem please reply  once you have received a response from your provider.  Your safety is important to us.  If you have drug allergies check your prescription carefully.    You can use MyChart to ask questions about today's visit, request a non-urgent call back, or ask for a work or school excuse for 24 hours related to this e-Visit. If it has been greater than 24 hours you will need to follow up with your provider, or enter a new e-Visit to address those concerns.  You will get an e-mail in the next two days asking about your experience.  I hope that your e-visit has been valuable and will speed your recovery. Thank you for using e-visits.  I provided 5 minutes of non face-to-face time during this encounter for chart review and documentation.   

## 2021-04-06 NOTE — Telephone Encounter (Signed)
Copied from [place] [phone]. Topic: General - Call Back - No Documentation >> Apr 06, 2021 11:38 AM Erick Blinks wrote: Reason for CRM: Pt cough, headache, sinus congestion, sore throat, possible fever (unsure, but most likely) overall does not feel well. Symptoms have lasted for over a week. Pt's wife wants pt to have virtual appt, she declined next available since it is not until the end of the month. Pt's wife wants pt tested for Covid. Recently exposed to a relative that was potentially covid positive (unconfirmed).   Best contact: [phone]

## 2021-04-06 NOTE — Telephone Encounter (Signed)
FYI Patient reports he does not want a COVID test. Patient advised that we don't have any appointments available for today or tomorrow. I did advise of virtual visit through [facility] or urgent care.

## 2021-04-13 ENCOUNTER — Telehealth: Payer: Self-pay

## 2021-04-13 NOTE — Telephone Encounter (Signed)
patient advised to be seen by health care provider.

## 2021-04-13 NOTE — Telephone Encounter (Signed)
Copied from [place] [id number]. Topic: General - Inquiry >> Apr 13, 2021  1:51 PM Mcneil, Ja-Kwan wrote: Reason for CRM: Pt wife stated pt is not getting any better and she would like to ask that Simona Huh Chrismon orders a x-ray since he does not have any appts available. Cb# [phone]

## 2021-04-20 ENCOUNTER — Ambulatory Visit
Admission: EM | Admit: 2021-04-20 | Discharge: 2021-04-20 | Disposition: A | Discharge status: Home / Self Care | Payer: 59 | Attending: Emergency Medicine | Admitting: Emergency Medicine

## 2021-04-20 ENCOUNTER — Encounter: Payer: Self-pay | Admitting: Emergency Medicine

## 2021-04-20 ENCOUNTER — Other Ambulatory Visit: Payer: Self-pay

## 2021-04-20 ENCOUNTER — Ambulatory Visit (INDEPENDENT_AMBULATORY_CARE_PROVIDER_SITE_OTHER): Payer: 59

## 2021-04-20 DIAGNOSIS — R0602 Shortness of breath: Secondary | ICD-10-CM

## 2021-04-20 DIAGNOSIS — R059 Cough, unspecified: Secondary | ICD-10-CM | POA: Diagnosis not present

## 2021-04-20 DIAGNOSIS — I10 Essential (primary) hypertension: Secondary | ICD-10-CM | POA: Diagnosis not present

## 2021-04-20 DIAGNOSIS — R058 Other specified cough: Secondary | ICD-10-CM | POA: Diagnosis not present

## 2021-04-20 MED ORDER — BENZONATATE 100 MG PO CAPS
100.0000 mg | ORAL_CAPSULE | Freq: Three times a day (TID) | ORAL | 0 refills | Status: DC | PRN
Start: 2021-04-20 — End: 2022-04-22

## 2021-04-20 NOTE — ED Triage Notes (Signed)
Pt presents today with c/o of nasal congestion, cough and fever x 1 month. He recently completed course of Augmentin. No recent tests for Covid.

## 2021-04-20 NOTE — Discharge Instructions (Addendum)
Take Tessalon as needed for cough.  Follow up with your primary care provider if your symptoms are not improving.    Go to the emergency department if you have acute shortness of breath or other concerning symptoms.    Your blood pressure is elevated today at 171/93.  Please have this rechecked by your primary care provider in 1-2 weeks.

## 2021-04-20 NOTE — ED Provider Notes (Signed)
Roderic Palau    CSN: [id number] Arrival date & time: 04/20/21  1200      History   Chief Complaint Chief Complaint  Patient presents with   Nasal Congestion   Cough   Fever    HPI Joshua Acevedo is a 60 y.o. male.  Patient presents with 1 month history of fever, congestion, productive cough, shortness of breath.  He denies rash, vomiting, diarrhea, or other symptoms.  Patient had an E-visit on 04/06/2021; diagnosed with bacterial sinusitis; treated with Augmentin.  He states his symptoms improved but then returned after he finished the Augmentin.  He has been taking OTC cold medication also for his symptoms.  His medical history includes hypertension, dyspnea, hepatitis C, arthritis, back pain, chronic pain, cannabis abuse, fibromyalgia.    The history is provided by the patient and medical records.   Past Medical History:  Diagnosis Date   Arthritis    "everywhere"   GERD (gastroesophageal reflux disease)    Hepatitis 2009   "c" - treated   Hepatitis C    Hyperlipidemia    Hypertension    Shortness of breath dyspnea    Wears dentures    partial upper    Patient Active Problem List   Diagnosis Date Noted   Benign neoplasm of descending colon    Facet syndrome, lumbar 08/10/2015   Fibromyalgia 06/25/2015   Primary Raynaud's phenomenon 06/25/2015   Arthralgia of multiple joints 03/16/2015   Chronic pain associated with significant psychosocial dysfunction 03/16/2015   Family history of arthritis 03/16/2015   Fatigue 03/16/2015   Acid reflux 03/16/2015   Gout 03/16/2015   Hypercholesteremia 03/16/2015   Benign hypertension 03/16/2015   Borderline diabetes 03/16/2015   Decreased libido 03/16/2015   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   Cannabis abuse 03/16/2015   Changeable mood 03/16/2015   Cramp of limb 03/16/2015   Acquired trigger finger 03/16/2015   Raynaud's syndrome 03/16/2015   Vitamin D deficiency 03/16/2015   Paroxysmal digital cyanosis 01/08/2014    Breathlessness on exertion 12/15/2009   Muscle ache 07/14/2009   Bone marrow failure ([place]) 07/08/2009   LBP (low back pain) 05/15/2009   Hepatitis C virus infection 06/02/2008   Malaise and fatigue 05/01/2008    Past Surgical History:  Procedure Laterality Date   COLONOSCOPY WITH PROPOFOL N/A 08/28/2015   Procedure: COLONOSCOPY WITH PROPOFOL;  Surgeon: Lucilla Lame, MD;  Location: [place];  Service: Endoscopy;  Laterality: N/A;   NO PAST SURGERIES     POLYPECTOMY  08/28/2015   Procedure: POLYPECTOMY;  Surgeon: Lucilla Lame, MD;  Location: [place];  Service: Endoscopy;;       Home Medications    Prior to Admission medications   Medication Sig Start Date End Date Taking? Authorizing Provider  benzonatate (TESSALON) 100 MG capsule Take 1 capsule (100 mg total) by mouth 3 (three) times daily as needed for cough. 04/20/21  Yes Sharion Balloon, NP  amoxicillin-clavulanate (AUGMENTIN) 875-125 MG tablet Take 1 tablet by mouth 2 (two) times daily. 04/06/21   Mar Daring, [name]  atorvastatin (LIPITOR) 40 MG tablet TAKE 1 TABLET BY MOUTH EVERY DAY 01/21/20   Chrismon, Vickki Muff, [name]  cholecalciferol (VITAMIN D) 1000 UNITS tablet Take 3 tablets by mouth daily.     [provider]  famotidine (PEPCID) 20 MG tablet Take 1 tablet (20 mg total) by mouth 2 (two) times daily. Patient not taking: No sig reported 11/09/18   Carrie Mew, MD  fluticasone (  FLONASE) 50 MCG/ACT nasal spray Place 2 sprays into both nostrils daily. 09/23/16   Chrismon, Vickki Muff, [name]  NIFEdipine (PROCARDIA-XL/NIFEDICAL-XL) 30 MG 24 hr tablet TAKE 1 TABLET BY MOUTH EVERY DAY 03/13/21   Chrismon, Vickki Muff, [name]  OLANZapine (ZYPREXA) 2.5 MG tablet TAKE 1 TABLET BY MOUTH AT BEDTIME. 10/26/20   Jerrol Banana., MD  Omeprazole 20 MG TBEC Take 1 tablet (20 mg total) by mouth daily. 11/21/17   Chrismon, Vickki Muff, [name]  traZODone (DESYREL) 50 MG tablet TAKE 1/2 TO 1 TABLET (25-50 MG TOTAL) BY  MOUTH AT BEDTIME AS NEEDED FOR SLEEP. 08/15/20   Chrismon, Vickki Muff, [name]    Family History Family History  Problem Relation Age of Onset   Diabetes Paternal Grandfather    Heart attack Maternal Grandmother    Heart attack Maternal Grandfather    Hypertension Mother    Cataracts Mother    Diabetes Father    Coronary artery disease Father     Social History Social History   Tobacco Use   Smoking status: Every Day    Packs/day: 0.75    Years: 40.00    Pack years: 30.00    Types: Cigarettes   Smokeless tobacco: Never   Tobacco comments:    Planning to quit New Years Eve  Substance Use Topics   Alcohol use: No    Alcohol/week: 0.0 standard drinks    Comment: formerly was a heavy user   Drug use: No    Comment: previously used marijuana, quit in 2009 when diagnosed with Hepatitis C     Allergies   Patient has no known allergies.   Review of Systems Review of Systems  Constitutional:  Positive for fever. Negative for chills.  HENT:  Positive for congestion. Negative for ear pain and sore throat.   Respiratory:  Positive for cough and shortness of breath.   Cardiovascular:  Negative for chest pain and palpitations.  Gastrointestinal:  Negative for abdominal pain, diarrhea and vomiting.  Skin:  Negative for color change and rash.  All other systems reviewed and are negative.   Physical Exam Triage Vital Signs ED Triage Vitals  Enc Vitals Group     BP      Pulse      Resp      Temp      Temp src      SpO2      Weight      Height      Head Circumference      Peak Flow      Pain Score      Pain Loc      Pain Edu?      Excl. in [place]?    No data found.  Updated Vital Signs BP (!) 171/93 (BP Location: Left Arm)   Pulse 70   Temp 98.1 F (36.7 C) (Oral)   Resp 18   SpO2 95%   Visual Acuity Right Eye Distance:   Left Eye Distance:   Bilateral Distance:    Right Eye Near:   Left Eye Near:    Bilateral Near:     Physical Exam Vitals and nursing  note reviewed.  Constitutional:      General: He is not in acute distress.    Appearance: He is well-developed.  HENT:     Head: Normocephalic and atraumatic.     Right Ear: Tympanic membrane normal.     Left Ear: Tympanic membrane normal.     Nose: Congestion  and rhinorrhea present.     Mouth/Throat:     Mouth: Mucous membranes are moist.     Pharynx: Oropharynx is clear.  Eyes:     Conjunctiva/sclera: Conjunctivae normal.  Cardiovascular:     Rate and Rhythm: Normal rate and regular rhythm.     Heart sounds: Normal heart sounds.  Pulmonary:     Effort: Pulmonary effort is normal. No respiratory distress.     Breath sounds: Rhonchi present.     Comments: Scattered rhonchi throughout. Abdominal:     Palpations: Abdomen is soft.     Tenderness: There is no abdominal tenderness.  Musculoskeletal:     Cervical back: Neck supple.  Skin:    General: Skin is warm and dry.  Neurological:     General: No focal deficit present.     Mental Status: He is alert and oriented to person, place, and time.     Gait: Gait normal.  Psychiatric:        Mood and Affect: Mood normal.        Behavior: Behavior normal.     UC Treatments / Results  Labs (all labs ordered are listed, but only abnormal results are displayed) Labs Reviewed - No data to display  EKG   Radiology DG Chest 2 View  Result Date: 04/20/2021 CLINICAL DATA:  Cough and congestion for 1 month EXAM: CHEST - 2 VIEW COMPARISON:  05/08/2020 FINDINGS: Cardiac shadow is within normal limits. The lungs are clear bilaterally. Calcified granuloma is again noted in the left mid lung. No sizable infiltrate or effusion is seen. No bony abnormality is noted. IMPRESSION: No acute abnormality seen. Electronically Signed   By: Inez Catalina M.D.   On: 04/20/2021 14:07    Procedures Procedures (including critical care time)  Medications Ordered in UC Medications - No data to display  Initial Impression / Assessment and Plan / UC  Course  I have reviewed the triage vital signs and the nursing notes.  Pertinent labs & imaging results that were available during my care of the patient were reviewed by me and considered in my medical decision making (see chart for details).   Productive cough, SOB, Elevated blood pressure with HTN.  Chest xray negative.  Treating cough with Tessalon [place].  Instructed patient to follow-up with his PCP if his symptoms are not improving.  ED precautions discussed if he has acute shortness of breath or other concerning symptoms.  Also discussed that his blood pressure is elevated today and needs to be rechecked by his PCP in 1 to 2 weeks.  Education provided on managing hypertension.  Patient agrees to plan of care.   Final Clinical Impressions(s) / UC Diagnoses   Final diagnoses:  Productive cough  SOB (shortness of breath)  Elevated blood pressure reading in office with diagnosis of hypertension     Discharge Instructions      Take Tessalon as needed for cough.  Follow up with your primary care provider if your symptoms are not improving.    Go to the emergency department if you have acute shortness of breath or other concerning symptoms.    Your blood pressure is elevated today at 171/93.  Please have this rechecked by your primary care provider in 1-2 weeks.          ED Prescriptions     Medication Sig Dispense Auth. Provider   benzonatate (TESSALON) 100 MG capsule Take 1 capsule (100 mg total) by mouth 3 (three) times daily as needed  for cough. 21 capsule Sharion Balloon, NP      PDMP not reviewed this encounter.   Sharion Balloon, NP 04/20/21 [phone]

## 2021-06-28 ENCOUNTER — Other Ambulatory Visit: Payer: Self-pay | Admitting: Family Medicine

## 2021-06-29 ENCOUNTER — Other Ambulatory Visit: Payer: Self-pay | Admitting: Family Medicine

## 2021-06-29 DIAGNOSIS — E7800 Pure hypercholesterolemia, unspecified: Secondary | ICD-10-CM

## 2021-06-29 NOTE — Telephone Encounter (Signed)
Requested medications are due for refill today.  yes  Requested medications are on the active medications list.  yes  Last refill. 03/13/2021  Future visit scheduled.   no  Notes to clinic.  Patient has not been seen for HTN since 07/06/2020. More than  3 months overdue for OV.

## 2021-06-30 NOTE — Telephone Encounter (Signed)
Requested medications are due for refill today.  unknown  Requested medications are on the active medications list.  yes  Last refill. 01/21/2020  Future visit scheduled.   no  Notes to clinic.  Prescription is expired.

## 2022-04-21 ENCOUNTER — Ambulatory Visit: Payer: Self-pay | Admitting: Nurse Practitioner

## 2022-04-22 ENCOUNTER — Emergency Department
Admission: EM | Admit: 2022-04-22 | Discharge: 2022-04-22 | Disposition: A | Discharge status: Home / Self Care | Payer: 59 | Attending: Emergency Medicine | Admitting: Emergency Medicine

## 2022-04-22 ENCOUNTER — Encounter: Payer: Self-pay | Admitting: Intensive Care

## 2022-04-22 ENCOUNTER — Other Ambulatory Visit: Payer: Self-pay

## 2022-04-22 ENCOUNTER — Emergency Department: Payer: 59

## 2022-04-22 DIAGNOSIS — R002 Palpitations: Secondary | ICD-10-CM

## 2022-04-22 DIAGNOSIS — I491 Atrial premature depolarization: Secondary | ICD-10-CM | POA: Diagnosis not present

## 2022-04-22 DIAGNOSIS — R079 Chest pain, unspecified: Secondary | ICD-10-CM | POA: Diagnosis not present

## 2022-04-22 DIAGNOSIS — I493 Ventricular premature depolarization: Secondary | ICD-10-CM | POA: Diagnosis not present

## 2022-04-22 DIAGNOSIS — I10 Essential (primary) hypertension: Secondary | ICD-10-CM

## 2022-04-22 LAB — CBC
HCT: 44.7 % (ref 39.0–52.0)
Hemoglobin: 15.2 g/dL (ref 13.0–17.0)
MCH: 30.2 pg (ref 26.0–34.0)
MCHC: 34 g/dL (ref 30.0–36.0)
MCV: 88.7 fL (ref 80.0–100.0)
Platelets: 221 10*3/uL (ref 150–400)
RBC: 5.04 MIL/uL (ref 4.22–5.81)
RDW: 12.6 % (ref 11.5–15.5)
WBC: 5.9 10*3/uL (ref 4.0–10.5)
nRBC: 0 % (ref 0.0–0.2)

## 2022-04-22 LAB — BASIC METABOLIC PANEL
Anion gap: 10 (ref 5–15)
BUN: 14 mg/dL (ref 8–23)
CO2: 24 mmol/L (ref 22–32)
Calcium: 9.7 mg/dL (ref 8.9–10.3)
Chloride: 105 mmol/L (ref 98–111)
Creatinine, Ser: 1 mg/dL (ref 0.61–1.24)
GFR, Estimated: >60 mL/min (ref >60)
Glucose, Bld: 137 mg/dL — ABNORMAL HIGH (ref 70–99)
Potassium: 3.8 mmol/L (ref 3.5–5.1)
Sodium: 139 mmol/L (ref 135–145)

## 2022-04-22 LAB — TROPONIN I (HIGH SENSITIVITY): Troponin I (High Sensitivity): 6 ng/L (ref <18)

## 2022-04-22 MED ORDER — NIFEDIPINE ER OSMOTIC RELEASE 30 MG PO TB24: 30.0000 mg | ORAL_TABLET | Freq: Every day | ORAL | 0 refills | Status: DC

## 2022-04-22 NOTE — ED Notes (Signed)
See triage note  Presents with some chest discomfort  Which started while at work this am. States he felt like his heart was fluttering

## 2022-04-22 NOTE — ED Provider Notes (Signed)
[facility] Provider Note   Event Date/Time   First MD Initiated Contact with Patient 04/22/22 [id number]     (approximate) History  Chest Pain  HPI Joshua Acevedo is a 61 y.o. male  Location: Chest Duration: Began this morning Timing: Resolved since onset.  Intermittent Severity: Mild Quality: Palpitations Context: Patient states that while at work today began feeling intermittent palpitations in the left chest Modifying factors: Denies any exacerbating or relieving factors Associated Symptoms: Denies ROS: Patient currently denies any vision changes, tinnitus, difficulty speaking, facial droop, sore throat, chest pain, shortness of breath, abdominal pain, nausea/vomiting/diarrhea, dysuria, or weakness/numbness/paresthesias in any extremity   Physical Exam  Triage Vital Signs: ED Triage Vitals  Enc Vitals Group     BP 04/22/22 0904 (!) 239/98     Pulse Rate 04/22/22 0904 72     Resp 04/22/22 0904 18     Temp 04/22/22 0904 98.1 F (36.7 C)     Temp Source 04/22/22 0904 Oral     SpO2 04/22/22 0904 96 %     Weight 04/22/22 0907 160 lb (72.6 kg)     Height 04/22/22 0907 '5\' 6"'$  (1.676 m)     Head Circumference --      Peak Flow --      Pain Score 04/22/22 0907 0     Pain Loc --      Pain Edu? --      Excl. in [place]? --    Most recent vital signs: Vitals:   04/22/22 0904 04/22/22 1006  BP: (!) 239/98 (!) 184/94  Pulse: 72 62  Resp: 18 16  Temp: 98.1 F (36.7 C)   SpO2: 96% 97%   General: Awake, oriented x4. CV:  Good peripheral perfusion.  Resp:  Normal effort.  Abd:  No distention.  Other:  Middle-aged Caucasian male laying in bed in no acute distress ED Results / Procedures / Treatments  Labs (all labs ordered are listed, but only abnormal results are displayed) Labs Reviewed  BASIC METABOLIC PANEL - Abnormal; Notable for the following components:      Result Value   Glucose, Bld 137 (*)    All other components within normal limits  CBC   TROPONIN I (HIGH SENSITIVITY)   EKG ED ECG REPORT I, Naaman Plummer, the attending physician, personally viewed and interpreted this ECG. Date: 04/22/2022 EKG Time: 0859 Rate: 76 Rhythm: normal sinus rhythm with premature atrial contractions QRS Axis: normal Intervals: normal ST/T Wave abnormalities: normal Narrative Interpretation: Normal sinus rhythm with premature atrial contractions.  No evidence of acute ischemia RADIOLOGY ED MD interpretation: 2 view chest x-ray interpreted by me shows no evidence of acute abnormalities including no pneumonia, pneumothorax, or widened mediastinum -Agree with radiology assessment Official radiology report(s): DG Chest 2 View  Result Date: 04/22/2022 CLINICAL DATA:  Chest pain, heart flutter ring, feels weird this morning EXAM: CHEST - 2 VIEW COMPARISON:  04/20/2021 FINDINGS: Normal heart size, mediastinal contours, and pulmonary vascularity. Lungs clear. Again noted calcified granuloma lingula. No acute infiltrate, pleural effusion, or pneumothorax. Osseous structures unremarkable. IMPRESSION: No acute abnormalities. Electronically Signed   By: Lavonia Dana M.D.   On: 04/22/2022 09:43   PROCEDURES: Critical Care performed: No .1-3 Lead EKG Interpretation  Performed by: Naaman Plummer, MD Authorized by: Naaman Plummer, MD     Interpretation: normal     ECG rate:  63   ECG rate assessment: normal     Rhythm: sinus rhythm  Ectopy: none     Conduction: normal    MEDICATIONS ORDERED IN ED: Medications - No data to display IMPRESSION / MDM / [place] / ED COURSE  I reviewed the triage vital signs and the nursing notes.                             The patient is on the cardiac monitor to evaluate for evidence of arrhythmia and/or significant heart rate changes. Patient's presentation is most consistent with acute presentation with potential threat to life or bodily function. 62 year old Caucasian male presents with  palpitations. EKG: No STEMI and no evidence of Brugadas sign, delta wave, epsilon wave, significantly prolonged QTc, or malignant arrhythmia. Based on H&P and testing, this patient appears to be low risk for emergent causes of palpitations such as, but not limited to, a malignant cardiac arrhythmia, ACS, pulmonary embolism, thyrotoxicosis, PNA, PTX.  EKG does show premature atrial contractions which may be the cause of patient's palpitations.  Patient sees Dr. Saralyn Pilar in cardiology for hypertension as well as chest pain in the past.  Patient did have a cardiac stress Test according to note from 03/13/2020 that was within normal limits.  The patient has been given strict return precautions and understands the need for further outpatient testing and treatment.   FINAL CLINICAL IMPRESSION(S) / ED DIAGNOSES   Final diagnoses:  Palpitations  Premature atrial contractions  Primary hypertension   Rx / DC Orders   ED Discharge Orders          Ordered    NIFEdipine (PROCARDIA-XL/NIFEDICAL-XL) 30 MG 24 hr tablet  Daily       Note to Pharmacy: Patient will need an office visit for further refills.   04/22/22 1016           Note:  This document was prepared using Dragon voice recognition software and may include unintentional dictation errors.   Naaman Plummer, MD 04/22/22 1024

## 2022-04-22 NOTE — ED Triage Notes (Signed)
Patient reports while at work today started having intermittent chest pain. Reports feeling sweaty and also feeling flutters.

## 2022-04-22 NOTE — ED Notes (Signed)
First Nurse Note: Pt to ED via POV c/o chest fluttering. Pt is currently in NAD. Pt pulled for EKG

## 2022-05-06 ENCOUNTER — Encounter: Payer: Self-pay | Admitting: Nurse Practitioner

## 2022-05-06 ENCOUNTER — Ambulatory Visit (INDEPENDENT_AMBULATORY_CARE_PROVIDER_SITE_OTHER): Payer: 59 | Admitting: Nurse Practitioner

## 2022-05-06 VITALS — BP 152/88 | HR 75 | Ht 66.0 in | Wt 158.0 lb

## 2022-05-06 DIAGNOSIS — E663 Overweight: Secondary | ICD-10-CM | POA: Diagnosis not present

## 2022-05-06 DIAGNOSIS — I10 Essential (primary) hypertension: Secondary | ICD-10-CM

## 2022-05-06 DIAGNOSIS — G4700 Insomnia, unspecified: Secondary | ICD-10-CM

## 2022-05-06 MED ORDER — SPHYGMOMANOMETER MISC: 1.0000 | Freq: Every day | 0 refills | Status: DC

## 2022-05-06 MED ORDER — TRAZODONE HCL 50 MG PO TABS: 25.0000 mg | ORAL_TABLET | Freq: Every evening | ORAL | 3 refills | Status: DC | PRN

## 2022-05-06 NOTE — Assessment & Plan Note (Signed)
BP 152/88 in the office today. Continue taking nefidine 30 mg daily. Advised patient to check BP at home. Monday for BP check and lab

## 2022-05-06 NOTE — Progress Notes (Unsigned)
New Patient Office Visit  Subjective    Patient ID: Joshua Acevedo, male    DOB: [birth year]  Age: 61 y.o. MRN: [id number]  CC:  Chief Complaint  Patient presents with   New Patient (Initial Visit)    HPI ALBY SCHWABE presents to establish care. His previous PCP was Dr. Jeananne Rama at [facility]. He has h/o  hypertension and hyperlipidemia. He works at [employer].   Patient has sleeping problem going on from last year. Patient states her wife reports that he sores at night. He wakes up 3-4 times each night. Most of the time he is able to go back to sleep.    Outpatient Encounter Medications as of 05/06/2022  Medication Sig   Blood Pressure Monitoring (SPHYGMOMANOMETER) MISC 1 each by Does not apply route daily.   cholecalciferol (VITAMIN D) 1000 UNITS tablet Take 3 tablets by mouth daily.    fluticasone (FLONASE) 50 MCG/ACT nasal spray Place 2 sprays into both nostrils daily.   Garlic (GARLIQUE PO) Take 1 each by mouth daily at 2 PM.   Inulin (FIBER CHOICE PO) Take 1 each by mouth daily at 2 PM.   NIFEdipine (PROCARDIA-XL/NIFEDICAL-XL) 30 MG 24 hr tablet Take 1 tablet (30 mg total) by mouth daily.   Omeprazole 20 MG TBEC Take 1 tablet (20 mg total) by mouth daily.   traZODone (DESYREL) 50 MG tablet Take 0.5-1 tablets (25-50 mg total) by mouth at bedtime as needed for sleep.   [DISCONTINUED] atorvastatin (LIPITOR) 40 MG tablet TAKE 1 TABLET BY MOUTH EVERY DAY   [DISCONTINUED] famotidine (PEPCID) 20 MG tablet Take 1 tablet (20 mg total) by mouth 2 (two) times daily. (Patient not taking: No sig reported)   [DISCONTINUED] OLANZapine (ZYPREXA) 2.5 MG tablet TAKE 1 TABLET BY MOUTH AT BEDTIME. (Patient not taking: Reported on 05/06/2022)   [DISCONTINUED] traZODone (DESYREL) 50 MG tablet TAKE 1/2 TO 1 TABLET (25-50 MG TOTAL) BY MOUTH AT BEDTIME AS NEEDED FOR SLEEP. (Patient not taking: Reported on 05/06/2022)   No facility-administered encounter medications on file as  of 05/06/2022.    Past Medical History:  Diagnosis Date   Arthritis    "everywhere"   GERD (gastroesophageal reflux disease)    Hepatitis 2009   "c" - treated   Hepatitis C    Hyperlipidemia    Hypertension    Shortness of breath dyspnea    Wears dentures    partial upper    Past Surgical History:  Procedure Laterality Date   COLONOSCOPY WITH PROPOFOL N/A 08/28/2015   Procedure: COLONOSCOPY WITH PROPOFOL;  Surgeon: Lucilla Lame, MD;  Location: [place];  Service: Endoscopy;  Laterality: N/A;   NO PAST SURGERIES     POLYPECTOMY  08/28/2015   Procedure: POLYPECTOMY;  Surgeon: Lucilla Lame, MD;  Location: [place];  Service: Endoscopy;;    Family History  Problem Relation Age of Onset   Diabetes Paternal Grandfather    Heart attack Maternal Grandmother    Heart attack Maternal Grandfather    Hypertension Mother    Cataracts Mother    Diabetes Father    Coronary artery disease Father     Social History   Socioeconomic History   Marital status: Married    Spouse name: Not on file   Number of children: Not on file   Years of education: Not on file   Highest education level: Not on file  Occupational History   Not on file  Tobacco Use  Smoking status: Some Days    Packs/day: 0.75    Years: 40.00    Total pack years: 30.00    Types: Cigarettes    Start date: 04/23/2022   Smokeless tobacco: Never   Tobacco comments:    Planning to quit New Years Eve  Vaping Use   Vaping Use: Never used  Substance and Sexual Activity   Alcohol use: Not Currently    Alcohol/week: 21.0 standard drinks of alcohol    Types: 21 Shots of liquor per week   Drug use: Not Currently    Comment: previously used marijuana, quit in 2009 when diagnosed with Hepatitis C   Sexual activity: Not on file  Other Topics Concern   Not on file  Social History Narrative   Not on file   Social Determinants of Health   Financial Resource Strain: Not on file  Food Insecurity: Not  on file  Transportation Needs: Not on file  Physical Activity: Not on file  Stress: Not on file  Social Connections: Not on file  Intimate Partner Violence: Not on file    Review of Systems  Constitutional:  Negative for fever and weight loss.  HENT:  Negative for ear discharge, hearing loss and sore throat.   Eyes:  Negative for blurred vision, photophobia and redness.  Respiratory:  Negative for cough, sputum production and wheezing.   Cardiovascular:  Negative for chest pain, orthopnea and PND.  Gastrointestinal:  Negative for abdominal pain, blood in stool and heartburn.  Genitourinary:  Negative for dysuria and hematuria.  Musculoskeletal:  Negative for back pain and myalgias.  Neurological:  Negative for dizziness, tingling and headaches.  Psychiatric/Behavioral:  Negative for depression, substance abuse and suicidal ideas.         Objective    BP (!) 152/88   Pulse 75   Ht '5\' 6"'$  (1.676 m)   Wt 158 lb (71.7 kg)   BMI 25.50 kg/m   Physical Exam      Assessment & Plan:   Problem List Items Addressed This Visit       Cardiovascular and Mediastinum   Hypertension - Primary    BP 152/88 in the office today. Continue taking nefidine 30 mg daily. Advised patient to check BP at home. Monday for BP check and lab        Other   Insomnia    Started him on trazodone 50 mg. Start taking half a tablet  Daily  for a week and if needed increase to 1 tablet per day      Overweight    Body mass index is 25.5 kg/m. Advised patient to follow regular exercise routine and watch diet       No follow-ups on file.   Theresia Lo, NP

## 2022-05-10 ENCOUNTER — Ambulatory Visit (INDEPENDENT_AMBULATORY_CARE_PROVIDER_SITE_OTHER): Payer: 59

## 2022-05-10 DIAGNOSIS — I10 Essential (primary) hypertension: Secondary | ICD-10-CM

## 2022-05-11 DIAGNOSIS — E663 Overweight: Secondary | ICD-10-CM | POA: Insufficient documentation

## 2022-05-11 DIAGNOSIS — G4700 Insomnia, unspecified: Secondary | ICD-10-CM | POA: Insufficient documentation

## 2022-05-11 NOTE — Assessment & Plan Note (Signed)
Started him on trazodone 50 mg. Start taking half a tablet  Daily  for a week and if needed increase to 1 tablet per day

## 2022-05-11 NOTE — Assessment & Plan Note (Signed)
Body mass index is 25.5 kg/m. Advised patient to follow regular exercise routine and watch diet

## 2022-05-13 LAB — COMPLETE METABOLIC PANEL WITH GFR
AG Ratio: 1.8 (calc) (ref 1.0–2.5)
ALT: 77 U/L — ABNORMAL HIGH (ref 9–46)
AST: 43 U/L — ABNORMAL HIGH (ref 10–35)
Albumin: 4.4 g/dL (ref 3.6–5.1)
Alkaline phosphatase (APISO): 91 U/L (ref 35–144)
BUN: 17 mg/dL (ref 7–25)
CO2: 24 mmol/L (ref 20–32)
Calcium: 9.9 mg/dL (ref 8.6–10.3)
Chloride: 103 mmol/L (ref 98–110)
Creat: 0.95 mg/dL (ref 0.70–1.35)
Globulin: 2.4 g/dL (calc) (ref 1.9–3.7)
Glucose, Bld: 121 mg/dL — ABNORMAL HIGH (ref 65–99)
Potassium: 4.9 mmol/L (ref 3.5–5.3)
Sodium: 139 mmol/L (ref 135–146)
Total Bilirubin: 0.3 mg/dL (ref 0.2–1.2)
Total Protein: 6.8 g/dL (ref 6.1–8.1)
eGFR: 90 mL/min/{1.73_m2} (ref >=60)

## 2022-05-13 LAB — CBC WITH DIFFERENTIAL/PLATELET
Absolute Monocytes: 599 cells/uL (ref 200–950)
Basophils Absolute: 52 cells/uL (ref 0–200)
Basophils Relative: 0.7 %
Eosinophils Absolute: 118 cells/uL (ref 15–500)
Eosinophils Relative: 1.6 %
HCT: 44 % (ref 38.5–50.0)
Hemoglobin: 15.1 g/dL (ref 13.2–17.1)
Lymphs Abs: 2701 cells/uL (ref 850–3900)
MCH: 31.5 pg (ref 27.0–33.0)
MCHC: 34.3 g/dL (ref 32.0–36.0)
MCV: 91.9 fL (ref 80.0–100.0)
MPV: 10.1 fL (ref 7.5–12.5)
Monocytes Relative: 8.1 %
Neutro Abs: 3929 cells/uL (ref 1500–7800)
Neutrophils Relative %: 53.1 %
Platelets: 257 10*3/uL (ref 140–400)
RBC: 4.79 10*6/uL (ref 4.20–5.80)
RDW: 12.8 % (ref 11.0–15.0)
Total Lymphocyte: 36.5 %
WBC: 7.4 10*3/uL (ref 3.8–10.8)

## 2022-05-13 LAB — LIPID PANEL
Cholesterol: 460 mg/dL — ABNORMAL HIGH (ref <200)
HDL: 46 mg/dL (ref >=40)
LDL Cholesterol (Calc): >350 mg/dL (calc) — ABNORMAL HIGH
Non-HDL Cholesterol (Calc): 414 mg/dL (calc) — ABNORMAL HIGH (ref <130)
Total CHOL/HDL Ratio: 10 (calc) — ABNORMAL HIGH (ref <5.0)
Triglycerides: 170 mg/dL — ABNORMAL HIGH (ref <150)

## 2022-05-13 LAB — PSA: PSA: 1.04 ng/mL (ref <=4.00)

## 2022-05-13 LAB — TSH: TSH: 1.53 mIU/L (ref 0.40–4.50)

## 2022-05-20 ENCOUNTER — Emergency Department
Admission: EM | Admit: 2022-05-20 | Discharge: 2022-05-20 | Disposition: A | Discharge status: Home / Self Care | Payer: 59 | Attending: Emergency Medicine | Admitting: Emergency Medicine

## 2022-05-20 ENCOUNTER — Other Ambulatory Visit: Payer: Self-pay

## 2022-05-20 ENCOUNTER — Ambulatory Visit: Payer: Self-pay | Admitting: Nurse Practitioner

## 2022-05-20 DIAGNOSIS — E785 Hyperlipidemia, unspecified: Secondary | ICD-10-CM

## 2022-05-20 DIAGNOSIS — I10 Essential (primary) hypertension: Secondary | ICD-10-CM

## 2022-05-20 LAB — CBC WITH DIFFERENTIAL/PLATELET
Abs Immature Granulocytes: 0.03 10*3/uL (ref 0.00–0.07)
Basophils Absolute: 0 10*3/uL (ref 0.0–0.1)
Basophils Relative: 1 %
Eosinophils Absolute: 0.1 10*3/uL (ref 0.0–0.5)
Eosinophils Relative: 1 %
HCT: 43.7 % (ref 39.0–52.0)
Hemoglobin: 15.3 g/dL (ref 13.0–17.0)
Immature Granulocytes: 0 %
Lymphocytes Relative: 43 %
Lymphs Abs: 3.4 10*3/uL (ref 0.7–4.0)
MCH: 30.8 pg (ref 26.0–34.0)
MCHC: 35 g/dL (ref 30.0–36.0)
MCV: 87.9 fL (ref 80.0–100.0)
Monocytes Absolute: 0.7 10*3/uL (ref 0.1–1.0)
Monocytes Relative: 9 %
Neutro Abs: 3.7 10*3/uL (ref 1.7–7.7)
Neutrophils Relative %: 46 %
Platelets: 277 10*3/uL (ref 150–400)
RBC: 4.97 MIL/uL (ref 4.22–5.81)
RDW: 12.5 % (ref 11.5–15.5)
WBC: 7.9 10*3/uL (ref 4.0–10.5)
nRBC: 0 % (ref 0.0–0.2)

## 2022-05-20 LAB — URINALYSIS, ROUTINE W REFLEX MICROSCOPIC
Bacteria, UA: NONE SEEN
Bilirubin Urine: NEGATIVE
Glucose, UA: NEGATIVE mg/dL
Hgb urine dipstick: NEGATIVE
Ketones, ur: NEGATIVE mg/dL
Leukocytes,Ua: NEGATIVE
Nitrite: NEGATIVE
Protein, ur: 30 mg/dL — AB
Specific Gravity, Urine: 1.013 (ref 1.005–1.030)
Squamous Epithelial / HPF: NONE SEEN (ref 0–5)
pH: 5 (ref 5.0–8.0)

## 2022-05-20 LAB — COMPREHENSIVE METABOLIC PANEL
ALT: 66 U/L — ABNORMAL HIGH (ref 0–44)
AST: 42 U/L — ABNORMAL HIGH (ref 15–41)
Albumin: 4.5 g/dL (ref 3.5–5.0)
Alkaline Phosphatase: 86 U/L (ref 38–126)
Anion gap: 8 (ref 5–15)
BUN: 17 mg/dL (ref 8–23)
CO2: 24 mmol/L (ref 22–32)
Calcium: 9.8 mg/dL (ref 8.9–10.3)
Chloride: 106 mmol/L (ref 98–111)
Creatinine, Ser: 1.11 mg/dL (ref 0.61–1.24)
GFR, Estimated: >60 mL/min (ref >60)
Glucose, Bld: 150 mg/dL — ABNORMAL HIGH (ref 70–99)
Potassium: 4.3 mmol/L (ref 3.5–5.1)
Sodium: 138 mmol/L (ref 135–145)
Total Bilirubin: 0.7 mg/dL (ref 0.3–1.2)
Total Protein: 7.7 g/dL (ref 6.5–8.1)

## 2022-05-20 LAB — TROPONIN I (HIGH SENSITIVITY): Troponin I (High Sensitivity): 6 ng/L (ref <18)

## 2022-05-20 MED ORDER — METFORMIN HCL 500 MG PO TABS: 500.0000 mg | ORAL_TABLET | Freq: Every day | ORAL | 0 refills | Status: DC

## 2022-05-20 MED ORDER — ATORVASTATIN CALCIUM 10 MG PO TABS: 10.0000 mg | ORAL_TABLET | Freq: Every day | ORAL | 0 refills | Status: DC

## 2022-05-20 MED ORDER — LOSARTAN POTASSIUM 25 MG PO TABS: 25.0000 mg | ORAL_TABLET | Freq: Every day | ORAL | 0 refills | Status: DC

## 2022-05-20 NOTE — ED Triage Notes (Addendum)
Ambulatory to triage with c/o hypertension. Readings >971 systolic at [facility] this morning. Reports taking prescribed BP meds as directed without missing doses.  Denies chest pain, denies sob. Denies any other sx. Also states he had recent lab work at PCP and he checked in [place] and "several things were elevated" but is unsure which labs were abnormal

## 2022-05-20 NOTE — ED Provider Notes (Signed)
[facility] Provider Note   Event Date/Time   First MD Initiated Contact with Patient 05/20/22 1010     (approximate) History  Hypertension  HPI Joshua Acevedo is a 61 y.o. male with a stated past medical history of hypertension, hypercholesterolemia, and prediabetes who presents for hypertension.  Patient states that he has been taking his medications on time and as prescribed (nifedipine).  Patient states that he had a throbbing headache today that follicles behind his eyes when he went to [facility] to check his blood pressure he found his systolics to be in the 161W.  Patient states that he was recently seen by his primary care physician and through looking at my chart and his results there was "a lot that was abnormal".  Patient states that his cholesterol was high as well as some liver labs.   ROS: Patient currently denies any vision changes, tinnitus, difficulty speaking, facial droop, sore throat, chest pain, shortness of breath, abdominal pain, nausea/vomiting/diarrhea, dysuria, or weakness/numbness/paresthesias in any extremity   Physical Exam  Triage Vital Signs: ED Triage Vitals  Enc Vitals Group     BP 05/20/22 0931 (!) 190/93     Pulse Rate 05/20/22 0931 84     Resp 05/20/22 0931 18     Temp 05/20/22 0931 97.8 F (36.6 C)     Temp Source 05/20/22 0931 Oral     SpO2 05/20/22 0931 98 %     Weight 05/20/22 0927 158 lb (71.7 kg)     Height 05/20/22 0927 '5\' 6"'$  (1.676 m)     Head Circumference --      Peak Flow --      Pain Score 05/20/22 0927 0     Pain Loc --      Pain Edu? --      Excl. in [place]? --    Most recent vital signs: Vitals:   05/20/22 0931 05/20/22 1009  BP: (!) 190/93 (!) 159/116  Pulse: 84 72  Resp: 18 14  Temp: 97.8 F (36.6 C)   SpO2: 98% 99%   General: Awake, oriented x4. CV:  Good peripheral perfusion.  Resp:  Normal effort.  Abd:  No distention.  Other:  Overweight middle-aged Caucasian male laying in bed in no acute  distress. ED Results / Procedures / Treatments  Labs (all labs ordered are listed, but only abnormal results are displayed) Labs Reviewed  COMPREHENSIVE METABOLIC PANEL - Abnormal; Notable for the following components:      Result Value   Glucose, Bld 150 (*)    AST 42 (*)    ALT 66 (*)    All other components within normal limits  CBC WITH DIFFERENTIAL/PLATELET  URINALYSIS, ROUTINE W REFLEX MICROSCOPIC  TROPONIN I (HIGH SENSITIVITY)   EKG ED ECG REPORT I, Naaman Plummer, the attending physician, personally viewed and interpreted this ECG. Date: 05/20/2022 EKG Time: 0933 Rate: 80 Rhythm: normal sinus rhythm QRS Axis: normal Intervals: normal ST/T Wave abnormalities: normal Narrative Interpretation: no evidence of acute ischemia PROCEDURES: Critical Care performed: No .1-3 Lead EKG Interpretation  Performed by: Naaman Plummer, MD Authorized by: Naaman Plummer, MD     Interpretation: normal     ECG rate:  74   ECG rate assessment: normal     Rhythm: sinus rhythm     Ectopy: none     Conduction: normal    MEDICATIONS ORDERED IN ED: Medications - No data to display IMPRESSION / MDM / ASSESSMENT AND  PLAN / ED COURSE  I reviewed the triage vital signs and the nursing notes.                             The patient is on the cardiac monitor to evaluate for evidence of arrhythmia and/or significant heart rate changes. Patient's presentation is most consistent with acute presentation with potential threat to life or bodily function. Presents to the emergency department complaining of high blood pressure. Patient is otherwise asymptomatic without confusion, chest pain, hematuria, or SOB. Denies nonadherence to antihypertensive regimen DDx: CV, AMI, heart failure, renal infarction or failure or other end organ damage.  Disposition: Discussed with patient their elevated blood pressure and need for close outpatient management of their hypertension. Will provide a prescription  for the patients previous antihypertensive medication, add losartan/metformin/atorvastatin and arrange for the patient to follow up in a primary care clinic     FINAL CLINICAL IMPRESSION(S) / ED DIAGNOSES   Final diagnoses:  Primary hypertension  Hyperlipidemia, unspecified hyperlipidemia type   Rx / DC Orders   ED Discharge Orders          Ordered    losartan (COZAAR) 25 MG tablet  Daily        05/20/22 1046    atorvastatin (LIPITOR) 10 MG tablet  Daily        05/20/22 1046    metFORMIN (GLUCOPHAGE) 500 MG tablet  Daily with breakfast        05/20/22 1046           Note:  This document was prepared using Dragon voice recognition software and may include unintentional dictation errors.   Naaman Plummer, MD 05/20/22 1140

## 2022-05-23 ENCOUNTER — Encounter: Payer: Self-pay | Admitting: Nurse Practitioner

## 2022-05-30 ENCOUNTER — Other Ambulatory Visit: Payer: Self-pay | Admitting: Nurse Practitioner

## 2022-06-19 ENCOUNTER — Other Ambulatory Visit: Payer: Self-pay | Admitting: Nurse Practitioner

## 2022-06-20 ENCOUNTER — Other Ambulatory Visit: Payer: Self-pay | Admitting: Nurse Practitioner

## 2022-06-20 ENCOUNTER — Encounter: Payer: Self-pay | Admitting: Nurse Practitioner

## 2022-06-20 ENCOUNTER — Other Ambulatory Visit: Payer: Self-pay | Admitting: *Deleted

## 2022-06-20 MED ORDER — LOSARTAN POTASSIUM 25 MG PO TABS: 25.0000 mg | ORAL_TABLET | Freq: Every day | ORAL | 0 refills | Status: DC

## 2022-06-20 MED ORDER — METFORMIN HCL 500 MG PO TABS: 500.0000 mg | ORAL_TABLET | Freq: Every day | ORAL | 4 refills | Status: DC

## 2022-06-20 MED ORDER — ATORVASTATIN CALCIUM 10 MG PO TABS: 10.0000 mg | ORAL_TABLET | Freq: Every day | ORAL | 0 refills | Status: DC

## 2022-06-20 MED ORDER — OMEPRAZOLE 20 MG PO CPDR: 20.0000 mg | DELAYED_RELEASE_CAPSULE | Freq: Every day | ORAL | 3 refills | Status: DC

## 2022-07-22 ENCOUNTER — Other Ambulatory Visit: Payer: Self-pay | Admitting: Nurse Practitioner

## 2022-07-22 ENCOUNTER — Other Ambulatory Visit: Payer: Self-pay | Admitting: *Deleted

## 2022-08-19 ENCOUNTER — Other Ambulatory Visit: Payer: Self-pay | Admitting: Nurse Practitioner

## 2022-09-10 ENCOUNTER — Other Ambulatory Visit: Payer: Self-pay | Admitting: Internal Medicine

## 2022-09-10 ENCOUNTER — Other Ambulatory Visit: Payer: Self-pay | Admitting: Nurse Practitioner

## 2022-10-14 ENCOUNTER — Other Ambulatory Visit: Payer: Self-pay | Admitting: Internal Medicine
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
## 2022-12-11 ENCOUNTER — Other Ambulatory Visit: Payer: Self-pay | Admitting: Nurse Practitioner

## 2022-12-15 ENCOUNTER — Other Ambulatory Visit: Payer: Self-pay | Admitting: Nurse Practitioner

## 2023-01-12 ENCOUNTER — Other Ambulatory Visit: Payer: Self-pay | Admitting: Nurse Practitioner

## 2023-01-16 ENCOUNTER — Other Ambulatory Visit: Payer: Self-pay | Admitting: Nurse Practitioner

## 2023-01-27 ENCOUNTER — Other Ambulatory Visit: Payer: Self-pay | Admitting: Nurse Practitioner

## 2023-02-09 ENCOUNTER — Encounter: Payer: Self-pay | Admitting: Nurse Practitioner

## 2023-02-14 ENCOUNTER — Ambulatory Visit: Payer: 59 | Admitting: Nurse Practitioner

## 2023-02-14 VITALS — BP 136/78 | HR 70 | Temp 97.6°F | Ht 65.0 in | Wt 154.2 lb

## 2023-02-14 DIAGNOSIS — Z716 Tobacco abuse counseling: Secondary | ICD-10-CM | POA: Diagnosis not present

## 2023-02-14 DIAGNOSIS — Z122 Encounter for screening for malignant neoplasm of respiratory organs: Secondary | ICD-10-CM

## 2023-02-14 DIAGNOSIS — R7303 Prediabetes: Secondary | ICD-10-CM

## 2023-02-14 DIAGNOSIS — G4700 Insomnia, unspecified: Secondary | ICD-10-CM

## 2023-02-14 DIAGNOSIS — Z0000 Encounter for general adult medical examination without abnormal findings: Secondary | ICD-10-CM

## 2023-02-14 DIAGNOSIS — I10 Essential (primary) hypertension: Secondary | ICD-10-CM

## 2023-02-14 MED ORDER — NIFEDIPINE ER OSMOTIC RELEASE 30 MG PO TB24
30.0000 mg | ORAL_TABLET | Freq: Every day | ORAL | 1 refills | Status: DC
Start: 2023-02-14 — End: 2023-08-28

## 2023-02-14 MED ORDER — OMEPRAZOLE 20 MG PO CPDR
20.0000 mg | DELAYED_RELEASE_CAPSULE | Freq: Every day | ORAL | 3 refills | Status: DC
Start: 2023-02-14 — End: 2023-03-17

## 2023-02-14 MED ORDER — TRAZODONE HCL 50 MG PO TABS
ORAL_TABLET | ORAL | 2 refills | Status: DC
Start: 2023-02-14 — End: 2023-04-07

## 2023-02-14 MED ORDER — METFORMIN HCL 500 MG PO TABS: 500.0000 mg | ORAL_TABLET | Freq: Every day | ORAL | 1 refills | Status: DC

## 2023-02-14 MED ORDER — LOSARTAN POTASSIUM 25 MG PO TABS: 25.0000 mg | ORAL_TABLET | Freq: Every day | ORAL | 2 refills | Status: DC

## 2023-02-14 NOTE — Patient Instructions (Addendum)
Rx sent to the pharmacy. Routine care .

## 2023-02-14 NOTE — Progress Notes (Signed)
Established Patient Office Visit  Subjective:  Patient ID: Joshua Acevedo, male    DOB: 08-13-1961  Age: 62 y.o. MRN: [id number]  CC:  Chief Complaint  Patient presents with   Medication Refill    HPI  Joshua Acevedo presents NWG:[id number] disease management.  He has history of hypertension, hyperlipidemia, GERD, arthritis and  insomnia.  He needs refill for medications. Overall he is doing fine no new concerns at present. HPI   Past Medical History:  Diagnosis Date   Arthritis    "everywhere"   GERD (gastroesophageal reflux disease)    Hepatitis 2009   "c" - treated   Hepatitis C    Hyperlipidemia    Hypertension    Shortness of breath dyspnea    Wears dentures    partial upper    Past Surgical History:  Procedure Laterality Date   COLONOSCOPY WITH PROPOFOL N/A 08/28/2015   Procedure: COLONOSCOPY WITH PROPOFOL;  Surgeon: Midge Minium, MD;  Location: [facility];  Service: Endoscopy;  Laterality: N/A;   NO PAST SURGERIES     POLYPECTOMY  08/28/2015   Procedure: POLYPECTOMY;  Surgeon: Midge Minium, MD;  Location: [facility];  Service: Endoscopy;;    Family History  Problem Relation Age of Onset   Diabetes Paternal Grandfather    Heart attack Maternal Grandmother    Heart attack Maternal Grandfather    Hypertension Mother    Cataracts Mother    Diabetes Father    Coronary artery disease Father     Social History   Socioeconomic History   Marital status: Married    Spouse name: Not on file   Number of children: Not on file   Years of education: Not on file   Highest education level: 12th grade  Occupational History   Not on file  Tobacco Use   Smoking status: Some Days    Packs/day: 0.75    Years: 40.00    Additional pack years: 0.00    Total pack years: 30.00    Types: Cigarettes    Start date: 04/23/2022   Smokeless tobacco: Never   Tobacco comments:    Planning to quit New Years Eve  Vaping Use   Vaping Use: Never used  Substance  and Sexual Activity   Alcohol use: Not Currently    Alcohol/week: 21.0 standard drinks of alcohol    Types: 21 Shots of liquor per week   Drug use: Not Currently    Comment: previously used marijuana, quit in 2009 when diagnosed with Hepatitis C   Sexual activity: Not on file  Other Topics Concern   Not on file  Social History Narrative   Not on file   Social Determinants of Health   Financial Resource Strain: Medium Risk (02/14/2023)   Overall Financial Resource Strain (CARDIA)    Difficulty of Paying Living Expenses: Somewhat hard  Food Insecurity: Food Insecurity Present (02/14/2023)   Hunger Vital Sign    Worried About Running Out of Food in the Last Year: Sometimes true    Ran Out of Food in the Last Year: Patient declined  Transportation Needs: No Transportation Needs (02/14/2023)   PRAPARE - Administrator, Civil Service (Medical): No    Lack of Transportation (Non-Medical): No  Physical Activity: Unknown (02/14/2023)   Exercise Vital Sign    Days of Exercise per Week: 7 days    Minutes of Exercise per Session: Patient declined  Stress: Stress Concern Present (02/14/2023)   [employer]  of Occupational Health - Occupational Stress Questionnaire    Feeling of Stress : To some extent  Social Connections: Unknown (02/14/2023)   Social Connection and Isolation Panel [NHANES]    Frequency of Communication with Friends and Family: Patient declined    Frequency of Social Gatherings with Friends and Family: Patient declined    Attends Religious Services: Patient declined    Database administrator or Organizations: No    Attends Engineer, structural: Not on file    Marital Status: Married  Catering manager Violence: Not on file     Outpatient Medications Prior to Visit  Medication Sig Dispense Refill   atorvastatin (LIPITOR) 10 MG tablet Take 1 tablet (10 mg total) by mouth daily. 30 tablet 0   losartan (COZAAR) 25 MG tablet TAKE 1 TABLET (25 MG TOTAL)  BY MOUTH DAILY. 30 tablet 0   metFORMIN (GLUCOPHAGE) 500 MG tablet TAKE 1 TABLET (500 MG TOTAL) BY MOUTH DAILY AT 2 PM. 90 tablet 1   NIFEdipine (PROCARDIA-XL/NIFEDICAL-XL) 30 MG 24 hr tablet TAKE 1 TABLET BY MOUTH EVERY DAY 90 tablet 1   omeprazole (PRILOSEC) 20 MG capsule Take 1 capsule (20 mg total) by mouth daily. 30 capsule 3   traZODone (DESYREL) 50 MG tablet TAKE 1/2-1 TAB BY MOUTH AT BEDTIME AS NEEDED FOR SLEEP. 90 tablet 2   atorvastatin (LIPITOR) 10 MG tablet TAKE 1 TABLET BY MOUTH EVERY DAY 90 tablet 1   Blood Pressure Monitoring (SPHYGMOMANOMETER) MISC 1 each by Does not apply route daily. 1 each 0   cholecalciferol (VITAMIN D) 1000 UNITS tablet Take 3 tablets by mouth daily.      fluticasone (FLONASE) 50 MCG/ACT nasal spray Place 2 sprays into both nostrils daily. 16 g 0   Garlic (GARLIQUE PO) Take 1 each by mouth daily at 2 PM.     Inulin (FIBER CHOICE PO) Take 1 each by mouth daily at 2 PM.     losartan (COZAAR) 25 MG tablet Take 1 tablet (25 mg total) by mouth daily. 30 tablet 0   No facility-administered medications prior to visit.    No Known Allergies  ROS Review of Systems    Objective:    Physical Exam  BP 136/78   Pulse 70   Temp 97.6 F (36.4 C) (Oral)   Ht 5\' 5"  (1.651 m)   Wt 154 lb 3.2 oz (69.9 kg)   SpO2 99%   BMI 25.66 kg/m  Wt Readings from Last 3 Encounters:  02/14/23 154 lb 3.2 oz (69.9 kg)  05/20/22 158 lb (71.7 kg)  05/06/22 158 lb (71.7 kg)     Health Maintenance  Topic Date Due   DTaP/Tdap/Td (1 - Tdap) Never done   Lung Cancer Screening  Never done   COLONOSCOPY (Pts 45-52yrs Insurance coverage will need to be confirmed)  08/27/2020   COVID-19 Vaccine (3 - 2023-24 season) 06/24/2022   Zoster Vaccines- Shingrix (1 of 2) 05/16/2023 (Originally 08/19/2009)   INFLUENZA VACCINE  05/25/2023   Hepatitis C Screening  Completed   HIV Screening  Completed   HPV VACCINES  Aged Out    There are no preventive care reminders to display for  this patient.  Lab Results  Component Value Date   TSH 1.53 05/10/2022   Lab Results  Component Value Date   WBC 7.9 05/20/2022   HGB 15.3 05/20/2022   HCT 43.7 05/20/2022   MCV 87.9 05/20/2022   PLT 277 05/20/2022   Lab Results  Component  Value Date   NA 138 05/20/2022   K 4.3 05/20/2022   CO2 24 05/20/2022   GLUCOSE 150 (H) 05/20/2022   BUN 17 05/20/2022   CREATININE 1.11 05/20/2022   BILITOT 0.7 05/20/2022   ALKPHOS 86 05/20/2022   AST 42 (H) 05/20/2022   ALT 66 (H) 05/20/2022   PROT 7.7 05/20/2022   ALBUMIN 4.5 05/20/2022   CALCIUM 9.8 05/20/2022   ANIONGAP 8 05/20/2022   EGFR 90 05/10/2022   Lab Results  Component Value Date   CHOL 460 (H) 05/10/2022   Lab Results  Component Value Date   HDL 46 05/10/2022   Lab Results  Component Value Date   LDLCALC >350 (H) 05/10/2022   Lab Results  Component Value Date   TRIG 170 (H) 05/10/2022   Lab Results  Component Value Date   CHOLHDL 10.0 (H) 05/10/2022   Lab Results  Component Value Date   HGBA1C 5.6 12/13/2019      Assessment & Plan:  Hypertension, unspecified type Assessment & Plan: Patient BP  Vitals:   02/14/23 1602  BP: 136/78    in the office. Advised pt to follow a low sodium and heart healthy diet. Continue losartan and nifedipine   Tobacco abuse counseling Assessment & Plan: Smoking cessation was discussed, 5-7 minutes was spent of this topic specifically.     Screening for lung cancer -     Ambulatory Referral for Lung Cancer Scre  Insomnia, unspecified type Assessment & Plan: Stable on medication. Refilled trazodone. Advised to follow good sleep hygiene   Prediabetes Assessment & Plan: Lab Results  Component Value Date   HGBA1C 5.6 12/13/2019  Continue metformin.  Will check hemoglobin A1c  Orders: -     Hemoglobin A1c -     Microalbumin / creatinine urine ratio  Annual physical exam -     Lipid panel; Future -     PSA; Future -     TSH; Future -      Comprehensive metabolic panel; Future -     CBC with Differential/Platelet; Future  Other orders -     Losartan Potassium; Take 1 tablet (25 mg total) by mouth daily.  Dispense: 90 tablet; Refill: 2 -     metFORMIN HCl; Take 1 tablet (500 mg total) by mouth daily at 2 PM.  Dispense: 90 tablet; Refill: 1 -     NIFEdipine ER Osmotic Release; Take 1 tablet (30 mg total) by mouth daily.  Dispense: 90 tablet; Refill: 1 -     Omeprazole; Take 1 capsule (20 mg total) by mouth daily.  Dispense: 30 capsule; Refill: 3 -     traZODone HCl; TAKE 1/2-1 TAB BY MOUTH AT BEDTIME AS NEEDED FOR SLEEP.  Dispense: 90 tablet; Refill: 2    Follow-up: Return for physical.   Kara Dies, NP

## 2023-02-23 ENCOUNTER — Other Ambulatory Visit: Payer: 59

## 2023-02-26 ENCOUNTER — Encounter: Payer: Self-pay | Admitting: Nurse Practitioner

## 2023-02-26 DIAGNOSIS — Z716 Tobacco abuse counseling: Secondary | ICD-10-CM | POA: Insufficient documentation

## 2023-02-26 NOTE — Assessment & Plan Note (Signed)
Lab Results  Component Value Date   HGBA1C 5.6 12/13/2019  Continue metformin.  Will check hemoglobin A1c

## 2023-02-26 NOTE — Assessment & Plan Note (Signed)
Patient BP  Vitals:   02/14/23 1602  BP: 136/78    in the office. Advised pt to follow a low sodium and heart healthy diet. Continue losartan and nifedipine

## 2023-02-26 NOTE — Assessment & Plan Note (Addendum)
Stable on medication. Refilled trazodone. Advised to follow good sleep hygiene

## 2023-02-26 NOTE — Assessment & Plan Note (Signed)
Smoking cessation was discussed, 5-7 minutes was spent of this topic specifically.   

## 2023-02-28 ENCOUNTER — Encounter: Payer: 59 | Admitting: Nurse Practitioner

## 2023-03-09 ENCOUNTER — Other Ambulatory Visit (INDEPENDENT_AMBULATORY_CARE_PROVIDER_SITE_OTHER): Payer: 59

## 2023-03-09 DIAGNOSIS — Z125 Encounter for screening for malignant neoplasm of prostate: Secondary | ICD-10-CM | POA: Diagnosis not present

## 2023-03-09 DIAGNOSIS — Z0000 Encounter for general adult medical examination without abnormal findings: Secondary | ICD-10-CM | POA: Diagnosis not present

## 2023-03-09 LAB — CBC WITH DIFFERENTIAL/PLATELET
Basophils Absolute: 0 10*3/uL (ref 0.0–0.1)
Basophils Relative: 0.3 % (ref 0.0–3.0)
Eosinophils Absolute: 0.1 10*3/uL (ref 0.0–0.7)
Eosinophils Relative: 1.7 % (ref 0.0–5.0)
HCT: 40.5 % (ref 39.0–52.0)
Hemoglobin: 14 g/dL (ref 13.0–17.0)
Lymphocytes Relative: 35 % (ref 12.0–46.0)
Lymphs Abs: 2.5 10*3/uL (ref 0.7–4.0)
MCHC: 34.6 g/dL (ref 30.0–36.0)
MCV: 90.9 fl (ref 78.0–100.0)
Monocytes Absolute: 0.7 10*3/uL (ref 0.1–1.0)
Monocytes Relative: 9.2 % (ref 3.0–12.0)
Neutro Abs: 3.8 10*3/uL (ref 1.4–7.7)
Neutrophils Relative %: 53.8 % (ref 43.0–77.0)
Platelets: 234 10*3/uL (ref 150.0–400.0)
RBC: 4.46 Mil/uL (ref 4.22–5.81)
RDW: 13.5 % (ref 11.5–15.5)
WBC: 7.1 10*3/uL (ref 4.0–10.5)

## 2023-03-09 LAB — COMPREHENSIVE METABOLIC PANEL
ALT: 29 U/L (ref 0–53)
AST: 22 U/L (ref 0–37)
Albumin: 4.3 g/dL (ref 3.5–5.2)
Alkaline Phosphatase: 64 U/L (ref 39–117)
BUN: 16 mg/dL (ref 6–23)
CO2: 28 mEq/L (ref 19–32)
Calcium: 9.4 mg/dL (ref 8.4–10.5)
Chloride: 104 mEq/L (ref 96–112)
Creatinine, Ser: 0.97 mg/dL (ref 0.40–1.50)
GFR: 83.06 mL/min (ref >60.00)
Glucose, Bld: 120 mg/dL — ABNORMAL HIGH (ref 70–99)
Potassium: 4.4 mEq/L (ref 3.5–5.1)
Sodium: 140 mEq/L (ref 135–145)
Total Bilirubin: 0.4 mg/dL (ref 0.2–1.2)
Total Protein: 6.4 g/dL (ref 6.0–8.3)

## 2023-03-09 LAB — PSA: PSA: 1.06 ng/mL (ref 0.10–4.00)

## 2023-03-09 LAB — LIPID PANEL
Cholesterol: 302 mg/dL — ABNORMAL HIGH (ref 0–200)
HDL: 43.8 mg/dL (ref >39.00)
LDL Cholesterol: 235 mg/dL — ABNORMAL HIGH (ref 0–99)
NonHDL: 258.08
Total CHOL/HDL Ratio: 7
Triglycerides: 117 mg/dL (ref 0.0–149.0)
VLDL: 23.4 mg/dL (ref 0.0–40.0)

## 2023-03-09 LAB — TSH: TSH: 1.75 u[IU]/mL (ref 0.35–5.50)

## 2023-03-17 ENCOUNTER — Encounter: Payer: Self-pay | Admitting: Nurse Practitioner

## 2023-03-17 ENCOUNTER — Ambulatory Visit (INDEPENDENT_AMBULATORY_CARE_PROVIDER_SITE_OTHER): Payer: 59 | Admitting: Nurse Practitioner

## 2023-03-17 ENCOUNTER — Encounter: Payer: 59 | Admitting: Nurse Practitioner

## 2023-03-17 VITALS — BP 134/76 | HR 69 | Temp 97.6°F | Ht 65.0 in | Wt 158.2 lb

## 2023-03-17 DIAGNOSIS — Z1211 Encounter for screening for malignant neoplasm of colon: Secondary | ICD-10-CM | POA: Diagnosis not present

## 2023-03-17 DIAGNOSIS — R7303 Prediabetes: Secondary | ICD-10-CM | POA: Diagnosis not present

## 2023-03-17 DIAGNOSIS — Z0000 Encounter for general adult medical examination without abnormal findings: Secondary | ICD-10-CM

## 2023-03-17 MED ORDER — ATORVASTATIN CALCIUM 20 MG PO TABS
20.0000 mg | ORAL_TABLET | Freq: Every evening | ORAL | 3 refills | Status: DC
Start: 2023-03-17 — End: 2024-03-27

## 2023-03-17 MED ORDER — OMEPRAZOLE 20 MG PO CPDR: DELAYED_RELEASE_CAPSULE | ORAL | 1 refills | Status: DC

## 2023-03-17 NOTE — Progress Notes (Signed)
Established Patient Office Visit  Subjective:  Patient ID: Joshua Acevedo, male    DOB: [birth year]  Age: 62 y.o. MRN: [id number]  CC:  Chief Complaint  Patient presents with   Annual Exam    Need colonoscopy referral.    HPI  Joshua Acevedo presents to the clinic for his annual physical exam.  Flu:no Tetanus: Due COVID: Moderna x2 Dentist:as needed Eye examination: as needed Exercise: no  Diet: Patient does eat meat. Patient consumes some fruits and veggies. Patient eat some fried food. Patient drinks mountain dew, coffee  Smoke: Some days, Planning to quit on New Year's Eve  HPI   Past Medical History:  Diagnosis Date   Arthritis    "everywhere"   GERD (gastroesophageal reflux disease)    Hepatitis 2009   "c" - treated   Hepatitis C    Hyperlipidemia    Hypertension    Shortness of breath dyspnea    Wears dentures    partial upper    Past Surgical History:  Procedure Laterality Date   COLONOSCOPY WITH PROPOFOL N/A 08/28/2015   Procedure: COLONOSCOPY WITH PROPOFOL;  Surgeon: Midge Minium, MD;  Location: [facility];  Service: Endoscopy;  Laterality: N/A;   NO PAST SURGERIES     POLYPECTOMY  08/28/2015   Procedure: POLYPECTOMY;  Surgeon: Midge Minium, MD;  Location: [facility];  Service: Endoscopy;;    Family History  Problem Relation Age of Onset   Diabetes Paternal Grandfather    Heart attack Maternal Grandmother    Heart attack Maternal Grandfather    Hypertension Mother    Cataracts Mother    Diabetes Father    Coronary artery disease Father     Social History   Socioeconomic History   Marital status: Married    Spouse name: Not on file   Number of children: Not on file   Years of education: Not on file   Highest education level: 12th grade  Occupational History   Not on file  Tobacco Use   Smoking status: Some Days    Packs/day: 0.75    Years: 40.00    Additional pack years: 0.00    Total pack years: 30.00    Types:  Cigarettes    Start date: 04/23/2022   Smokeless tobacco: Never   Tobacco comments:    Planning to quit New Years Eve  Vaping Use   Vaping Use: Never used  Substance and Sexual Activity   Alcohol use: Not Currently    Alcohol/week: 21.0 standard drinks of alcohol    Types: 21 Shots of liquor per week   Drug use: Not Currently    Comment: previously used marijuana, quit in 2009 when diagnosed with Hepatitis C   Sexual activity: Not on file  Other Topics Concern   Not on file  Social History Narrative   Not on file   Social Determinants of Health   Financial Resource Strain: Medium Risk (02/14/2023)   Overall Financial Resource Strain (CARDIA)    Difficulty of Paying Living Expenses: Somewhat hard  Food Insecurity: Food Insecurity Present (02/14/2023)   Hunger Vital Sign    Worried About Running Out of Food in the Last Year: Sometimes true    Ran Out of Food in the Last Year: Patient declined  Transportation Needs: No Transportation Needs (02/14/2023)   PRAPARE - Administrator, Civil Service (Medical): No    Lack of Transportation (Non-Medical): No  Physical Activity: Unknown (02/14/2023)   Exercise  Vital Sign    Days of Exercise per Week: 7 days    Minutes of Exercise per Session: Patient declined  Stress: Stress Concern Present (02/14/2023)   [employer] of Occupational Health - Occupational Stress Questionnaire    Feeling of Stress : To some extent  Social Connections: Unknown (02/14/2023)   Social Connection and Isolation Panel [NHANES]    Frequency of Communication with Friends and Family: Patient declined    Frequency of Social Gatherings with Friends and Family: Patient declined    Attends Religious Services: Patient declined    Database administrator or Organizations: No    Attends Engineer, structural: Not on file    Marital Status: Married  Catering manager Violence: Not on file     Outpatient Medications Prior to Visit  Medication Sig  Dispense Refill   losartan (COZAAR) 25 MG tablet Take 1 tablet (25 mg total) by mouth daily. 90 tablet 2   metFORMIN (GLUCOPHAGE) 500 MG tablet Take 1 tablet (500 mg total) by mouth daily at 2 PM. 90 tablet 1   NIFEdipine (PROCARDIA-XL/NIFEDICAL-XL) 30 MG 24 hr tablet Take 1 tablet (30 mg total) by mouth daily. 90 tablet 1   traZODone (DESYREL) 50 MG tablet TAKE 1/2-1 TAB BY MOUTH AT BEDTIME AS NEEDED FOR SLEEP. 90 tablet 2   atorvastatin (LIPITOR) 10 MG tablet Take 1 tablet (10 mg total) by mouth daily. 30 tablet 0   atorvastatin (LIPITOR) 10 MG tablet TAKE 1 TABLET BY MOUTH EVERY DAY 90 tablet 1   omeprazole (PRILOSEC) 20 MG capsule Take 1 capsule (20 mg total) by mouth daily. 30 capsule 3   No facility-administered medications prior to visit.    No Known Allergies  ROS Review of Systems  All other systems reviewed and are negative.     Objective:    Physical Exam Constitutional:      Appearance: Normal appearance.  HENT:     Head: Normocephalic.     Right Ear: Tympanic membrane normal.     Left Ear: Tympanic membrane normal.     Nose: Nose normal.     Mouth/Throat:     Mouth: Mucous membranes are moist.  Cardiovascular:     Rate and Rhythm: Normal rate and regular rhythm.     Pulses: Normal pulses.     Heart sounds: Normal heart sounds.  Pulmonary:     Effort: Pulmonary effort is normal.     Breath sounds: Normal breath sounds. No stridor. No wheezing.  Abdominal:     General: Bowel sounds are normal.     Palpations: Abdomen is soft. There is no mass.     Tenderness: There is no abdominal tenderness.  Musculoskeletal:        General: No swelling.     Cervical back: Normal range of motion and neck supple.     Right lower leg: No edema.     Left lower leg: No edema.  Skin:    General: Skin is warm.     Findings: No rash.  Neurological:     General: No focal deficit present.     Mental Status: He is alert and oriented to person, place, and time. Mental status is  at baseline.  Psychiatric:        Mood and Affect: Mood normal.        Behavior: Behavior normal.        Thought Content: Thought content normal.        Judgment: Judgment normal.  BP 134/76 (BP Location: Left Arm)   Pulse 69   Temp 97.6 F (36.4 C) (Temporal)   Ht 5\' 5"  (1.651 m)   Wt 158 lb 3.2 oz (71.8 kg)   SpO2 98%   BMI 26.33 kg/m  Wt Readings from Last 3 Encounters:  03/17/23 158 lb 3.2 oz (71.8 kg)  02/14/23 154 lb 3.2 oz (69.9 kg)  05/20/22 158 lb (71.7 kg)     Health Maintenance  Topic Date Due   DTaP/Tdap/Td (1 - Tdap) Never done   Lung Cancer Screening  Never done   Colonoscopy  08/27/2020   Zoster Vaccines- Shingrix (1 of 2) 05/16/2023 (Originally 08/19/2009)   INFLUENZA VACCINE  05/25/2023   Hepatitis C Screening  Completed   HIV Screening  Completed   HPV VACCINES  Aged Out   COVID-19 Vaccine  Discontinued    There are no preventive care reminders to display for this patient.  Lab Results  Component Value Date   TSH 1.75 03/09/2023   Lab Results  Component Value Date   WBC 7.1 03/09/2023   HGB 14.0 03/09/2023   HCT 40.5 03/09/2023   MCV 90.9 03/09/2023   PLT 234.0 03/09/2023   Lab Results  Component Value Date   NA 140 03/09/2023   K 4.4 03/09/2023   CO2 28 03/09/2023   GLUCOSE 120 (H) 03/09/2023   BUN 16 03/09/2023   CREATININE 0.97 03/09/2023   BILITOT 0.4 03/09/2023   ALKPHOS 64 03/09/2023   AST 22 03/09/2023   ALT 29 03/09/2023   PROT 6.4 03/09/2023   ALBUMIN 4.3 03/09/2023   CALCIUM 9.4 03/09/2023   ANIONGAP 8 05/20/2022   EGFR 90 05/10/2022   GFR 83.06 03/09/2023   Lab Results  Component Value Date   CHOL 302 (H) 03/09/2023   Lab Results  Component Value Date   HDL 43.80 03/09/2023   Lab Results  Component Value Date   LDLCALC 235 (H) 03/09/2023   Lab Results  Component Value Date   TRIG 117.0 03/09/2023   Lab Results  Component Value Date   CHOLHDL 7 03/09/2023   Lab Results  Component Value Date    HGBA1C 5.6 12/13/2019      Assessment & Plan:  Encounter for screening colonoscopy -     Ambulatory referral to Gastroenterology  Prediabetes -     Hemoglobin A1c; Future  Annual physical exam Assessment & Plan: Encouraged patient to consume a balanced diet and regular exercise regimen. Advised to see an eye doctor and dentist annually.  Patient do not want Tdap vaccine at present. Referral placed for colonoscopy   Other orders -     Omeprazole; Take 1 tablet half an hour before breakfast daily.  Dispense: 90 capsule; Refill: 1 -     Atorvastatin Calcium; Take 1 tablet (20 mg total) by mouth every evening.  Dispense: 90 tablet; Refill: 3    Follow-up: Return in about 6 months (around 09/17/2023).   Kara Dies, NP

## 2023-03-17 NOTE — Patient Instructions (Addendum)
Increase atorvastatin from 10 mg to 20 mg take it at night. Take 2 tablets of 10 mg atorvastatin until finished and new refill of 20 mg has been sent. Take metformin with food either in the morning or at nighttime. Will check hemoglobin A1c before your next appointment in 6 months. Has been sent for colonoscopy. Will do Tdap vaccine at the next visit.

## 2023-03-26 DIAGNOSIS — Z0000 Encounter for general adult medical examination without abnormal findings: Secondary | ICD-10-CM | POA: Insufficient documentation

## 2023-03-26 NOTE — Assessment & Plan Note (Signed)
Encouraged patient to consume a balanced diet and regular exercise regimen. Advised to see an eye doctor and dentist annually.  Patient do not want Tdap vaccine at present. Referral placed for colonoscopy

## 2023-03-27 ENCOUNTER — Encounter: Payer: Self-pay | Admitting: *Deleted

## 2023-04-07 ENCOUNTER — Other Ambulatory Visit: Payer: Self-pay | Admitting: Nurse Practitioner

## 2023-05-26 ENCOUNTER — Other Ambulatory Visit: Payer: Self-pay | Admitting: Nurse Practitioner

## 2023-07-03 ENCOUNTER — Encounter: Payer: Self-pay | Admitting: Nurse Practitioner

## 2023-07-03 ENCOUNTER — Telehealth: Payer: 59 | Admitting: Nurse Practitioner

## 2023-07-03 DIAGNOSIS — U071 COVID-19: Secondary | ICD-10-CM | POA: Diagnosis not present

## 2023-07-03 MED ORDER — MOLNUPIRAVIR EUA 200MG CAPSULE
4.0000 | ORAL_CAPSULE | Freq: Two times a day (BID) | ORAL | 0 refills | Status: DC
Start: 2023-07-03 — End: 2023-07-03

## 2023-07-03 MED ORDER — NIRMATRELVIR/RITONAVIR (PAXLOVID)TABLET
3.0000 | ORAL_TABLET | Freq: Two times a day (BID) | ORAL | 0 refills | Status: AC
Start: 2023-07-03 — End: 2023-07-08

## 2023-07-03 NOTE — Telephone Encounter (Signed)
Spoke to pt. Pt stated he has an appt with a virtual clinic. Canceling appt for kaur

## 2023-07-03 NOTE — Telephone Encounter (Signed)
FYI  Pt did a virtual visit. Was prescribed meds.

## 2023-07-03 NOTE — Patient Instructions (Addendum)
Hold atorvastatin for 5 days  Use Trazodon only as needed - patient will try to hold

## 2023-07-03 NOTE — Addendum Note (Signed)
Addended by: Viviano Simas E on: 07/03/2023 04:07 PM   Modules accepted: Orders

## 2023-07-03 NOTE — Telephone Encounter (Signed)
PT tested positive for covid. Message sent to pt asking what are his symptoms and to be schedule with any provider

## 2023-07-03 NOTE — Progress Notes (Addendum)
Virtual Visit Consent   Joshua Acevedo, you are scheduled for a virtual visit with a [facility] provider today. Just as with appointments in the office, your consent must be obtained to participate. Your consent will be active for this visit and any virtual visit you may have with one of our providers in the next 365 days. If you have a MyChart account, a copy of this consent can be sent to you electronically.  As this is a virtual visit, video technology does not allow for your provider to perform a traditional examination. This may limit your provider's ability to fully assess your condition. If your provider identifies any concerns that need to be evaluated in person or the need to arrange testing (such as labs, EKG, etc.), we will make arrangements to do so. Although advances in technology are sophisticated, we cannot ensure that it will always work on either your end or our end. If the connection with a video visit is poor, the visit may have to be switched to a telephone visit. With either a video or telephone visit, we are not always able to ensure that we have a secure connection.  By engaging in this virtual visit, you consent to the provision of healthcare and authorize for your insurance to be billed (if applicable) for the services provided during this visit. Depending on your insurance coverage, you may receive a charge related to this service.  I need to obtain your verbal consent now. Are you willing to proceed with your visit today? Joshua Acevedo has provided verbal consent on 07/03/2023 for a virtual visit (video or telephone). Viviano Simas, FNP  Date: 07/03/2023 1:10 PM  Virtual Visit via Video Note   I, Viviano Simas, connected with  Joshua Acevedo  ([id number], May 06, 1961) on 07/03/23 at  1:15 PM EDT by a video-enabled telemedicine application and verified that I am speaking with the correct person using two identifiers.  Location: Patient: Virtual Visit Location Patient:  Home Provider: Virtual Visit Location Provider: Home Office   I discussed the limitations of evaluation and management by telemedicine and the availability of in person appointments. The patient expressed understanding and agreed to proceed.    History of Present Illness: Joshua Acevedo is a 62 y.o. who identifies as a male who was assigned male at birth, and is being seen today after testing positive for COVID at home.  Testes positive 07/01/23 Symptom onset was 06/30/23   Symptoms today include: Flu like symptoms, body aches, nasal congestion  Denies SOB   Has been using Coricidin HBP   GFR 83 in May Denies a history of kidney disease  Pertinent history include: HTN  He has not had COVID in the past  He has had 2 vaccines for COVID     Problems:  Patient Active Problem List   Diagnosis Date Noted   Annual physical exam 03/26/2023   Tobacco abuse counseling 02/26/2023   Insomnia 05/11/2022   Overweight 05/11/2022   Chest pain with high risk for cardiac etiology 05/19/2020   Benign neoplasm of descending colon    Facet syndrome, lumbar 08/10/2015   Fibromyalgia 06/25/2015   Primary Raynaud's phenomenon 06/25/2015   Arthralgia of multiple joints 03/16/2015   Chronic pain associated with significant psychosocial dysfunction 03/16/2015   Family history of arthritis 03/16/2015   Fatigue 03/16/2015   Gastro-esophageal reflux disease without esophagitis 03/16/2015   Gout 03/16/2015   Pure hypercholesterolemia, unspecified 03/16/2015   Hypertension 03/16/2015   Prediabetes 03/16/2015  Decreased libido 03/16/2015   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   Cannabis abuse 03/16/2015   Changeable mood 03/16/2015   Cramp of limb 03/16/2015   Acquired trigger finger 03/16/2015   Raynaud's syndrome 03/16/2015   Vitamin D deficiency 03/16/2015   Cramp and spasm 03/16/2015   Paroxysmal digital cyanosis 01/08/2014   SOB (shortness of breath) on exertion 12/15/2009   Muscle ache 07/14/2009    Bone marrow failure (HCC) 07/08/2009   LBP (low back pain) 05/15/2009   Hepatitis C virus infection 06/02/2008   Malaise and fatigue 05/01/2008    Allergies: No Known Allergies Medications:  Current Outpatient Medications:    atorvastatin (LIPITOR) 20 MG tablet, Take 1 tablet (20 mg total) by mouth every evening., Disp: 90 tablet, Rfl: 3   losartan (COZAAR) 25 MG tablet, Take 1 tablet (25 mg total) by mouth daily., Disp: 90 tablet, Rfl: 2   metFORMIN (GLUCOPHAGE) 500 MG tablet, Take 1 tablet (500 mg total) by mouth daily at 2 PM., Disp: 90 tablet, Rfl: 1   NIFEdipine (PROCARDIA-XL/NIFEDICAL-XL) 30 MG 24 hr tablet, Take 1 tablet (30 mg total) by mouth daily., Disp: 90 tablet, Rfl: 1   omeprazole (PRILOSEC) 20 MG capsule, Take 1 tablet half an hour before breakfast daily., Disp: 90 capsule, Rfl: 1   traZODone (DESYREL) 50 MG tablet, TAKE 1/2-1 TAB BY MOUTH AT BEDTIME AS NEEDED FOR SLEEP., Disp: 90 tablet, Rfl: 1  Observations/Objective: Patient is well-developed, well-nourished in no acute distress.  Resting comfortably  at home.  Head is normocephalic, atraumatic.  No labored breathing.  Speech is clear and coherent with logical content.  Patient is alert and oriented at baseline.    Assessment and Plan:  1. COVID-19  Take anti-viral with food   Continue Coricidin for symptom management   Hold atorvastatin for 5 days  Use Trazodon only as needed - patient will try to hold   Meds ordered this encounter  Medications   DISCONTD: molnupiravir EUA (LAGEVRIO) 200 mg CAPS capsule    Sig: Take 4 capsules (800 mg total) by mouth 2 (two) times daily for 5 days.    Dispense:  40 capsule    Refill:  0   nirmatrelvir/ritonavir (PAXLOVID) 20 x 150 MG & 10 x 100MG  TABS    Sig: Take 3 tablets by mouth 2 (two) times daily for 5 days. (Take nirmatrelvir 150 mg two tablets twice daily for 5 days and ritonavir 100 mg one tablet twice daily for 5 days) Patient GFR is 83    Dispense:  30  tablet    Refill:  0    Paxlovid is covered by insurance and Molnupiravir is not  Discussed drug to drug interactions and medications that need to be held   Follow Up Instructions: I discussed the assessment and treatment plan with the patient. The patient was provided an opportunity to ask questions and all were answered. The patient agreed with the plan and demonstrated an understanding of the instructions.  A copy of instructions were sent to the patient via MyChart unless otherwise noted below.    The patient was advised to call back or seek an in-person evaluation if the symptoms worsen or if the condition fails to improve as anticipated.  Time:  I spent 12 minutes with the patient via telehealth technology discussing the above problems/concerns.    Viviano Simas, FNP

## 2023-07-04 ENCOUNTER — Telehealth: Payer: 59 | Admitting: Nurse Practitioner

## 2023-08-07 ENCOUNTER — Telehealth: Payer: Self-pay | Admitting: Nurse Practitioner

## 2023-08-08 ENCOUNTER — Telehealth: Payer: Self-pay | Admitting: Nurse Practitioner

## 2023-08-08 NOTE — Telephone Encounter (Signed)
Patient walked into office asking to speak with provider. Office advise patient she was not available. Front office is patient wanted to make an appointment to speak to her, patient said no. Patient would like Joshua Acevedo to call Joshua Acevedo, he needs help with mental health. Please call Joshua Acevedo at [phone] .

## 2023-08-10 ENCOUNTER — Telehealth (INDEPENDENT_AMBULATORY_CARE_PROVIDER_SITE_OTHER): Payer: 59 | Admitting: Nurse Practitioner

## 2023-08-10 ENCOUNTER — Encounter: Payer: Self-pay | Admitting: Nurse Practitioner

## 2023-08-10 DIAGNOSIS — F32A Depression, unspecified: Secondary | ICD-10-CM | POA: Diagnosis not present

## 2023-08-10 DIAGNOSIS — F419 Anxiety disorder, unspecified: Secondary | ICD-10-CM | POA: Diagnosis not present

## 2023-08-10 MED ORDER — ALPRAZOLAM 0.25 MG PO TABS: 0.2500 mg | ORAL_TABLET | Freq: Two times a day (BID) | ORAL | 0 refills | Status: DC | PRN

## 2023-08-10 NOTE — Telephone Encounter (Signed)
Noted  

## 2023-08-10 NOTE — Progress Notes (Signed)
Virtual Visit via Video Note  I connected with ARNOL COPPEDGE on 08/10/23 at 4:06 PM by a video enabled telemedicine application and verified that I am speaking with the correct person using two identifiers.  Patient Location: Home Provider Location: Office/Clinic  I discussed the limitations, risks, security, and privacy concerns of performing an evaluation and management service by video and the availability of in person appointments. I also discussed with the patient that there may be a patient responsible charge related to this service. The patient expressed understanding and agreed to proceed.  Subjective: PCP: Kara Dies, NP  Chief Complaint  Patient presents with   Depression    Anxiety and Depression- his father passed, they had a bad case of covid, wife has been in & out of the hospital for 3 weeks and is now on life support   HPI  Patient is seen due to severe anxiety on intake of his father and his wife being hospitalized and she is on life support due to critical illness.  His father passed away few weeks ago in [place] and they got sick of COVID.  He is feeling overwhelmed by the double emotional burden of brief and fear of losing his wife.  He has not been to work and is unable to focus due to constant worrying.   Patient reports an increase in spending and purchasing new items as a way to cope with feelings of anxiety and distress.  However the patient is aware that this behavior may be contributing to financial strain and is beginning to recognize it as an unhealthy coping strategy.  He would like to get started on some medication for his anxiety.  He has tried Xanax in the past and helped.  ROS: Per HPI  Current Outpatient Medications:    ALPRAZolam (XANAX) 0.25 MG tablet, Take 1 tablet (0.25 mg total) by mouth 2 (two) times daily as needed for anxiety., Disp: 30 tablet, Rfl: 0   atorvastatin (LIPITOR) 20 MG tablet, Take 1 tablet (20 mg total) by mouth every  evening., Disp: 90 tablet, Rfl: 3   losartan (COZAAR) 25 MG tablet, Take 1 tablet (25 mg total) by mouth daily., Disp: 90 tablet, Rfl: 2   metFORMIN (GLUCOPHAGE) 500 MG tablet, Take 1 tablet (500 mg total) by mouth daily at 2 PM., Disp: 90 tablet, Rfl: 1   NIFEdipine (PROCARDIA-XL/NIFEDICAL-XL) 30 MG 24 hr tablet, Take 1 tablet (30 mg total) by mouth daily., Disp: 90 tablet, Rfl: 1   omeprazole (PRILOSEC) 20 MG capsule, Take 1 tablet half an hour before breakfast daily., Disp: 90 capsule, Rfl: 1   traZODone (DESYREL) 50 MG tablet, TAKE 1/2-1 TAB BY MOUTH AT BEDTIME AS NEEDED FOR SLEEP., Disp: 90 tablet, Rfl: 1  Observations/Objective: There were no vitals filed for this visit. Physical Exam Constitutional:      Appearance: He is not ill-appearing.  Eyes:     Conjunctiva/sclera: Conjunctivae normal.  Pulmonary:     Effort: No respiratory distress.  Neurological:     Mental Status: He is alert.  Psychiatric:        Mood and Affect: Mood is anxious.        Behavior: Behavior normal.        Thought Content: Thought content normal.        Judgment: Judgment normal.     Assessment and Plan: Anxiety and depression Assessment & Plan: His PHQ-9 score is 14 and GAD score 21. Given patient symptoms will treat with Xanax  0.25 mg twice a day as needed. Medication side effects discussed and advised patient to abstain from alcohol. Advised to perform deep breathing exercises, journaling and meditation. Patient would let us know if symptoms not improving.     Other orders -     ALPRAZolam; Take 1 tablet (0.25 mg total) by mouth 2 (two) times daily as needed for anxiety.  Dispense: 30 tablet; Refill: 0    Follow Up Instructions: Return if symptoms worsen or fail to improve.   I discussed the assessment and treatment plan with the patient. The patient was provided an opportunity to ask questions, and all were answered. The patient agreed with the plan and demonstrated an understanding of  the instructions.   The patient was advised to call back or seek an in-person evaluation if the symptoms worsen or if the condition fails to improve as anticipated.  The above assessment and management plan was discussed with the patient. The patient verbalized understanding of and has agreed to the management plan.   Kara Dies, NP

## 2023-08-16 DIAGNOSIS — F32A Depression, unspecified: Secondary | ICD-10-CM | POA: Insufficient documentation

## 2023-08-16 NOTE — Assessment & Plan Note (Signed)
His PHQ-9 score is 14 and GAD score 21. Given patient symptoms will treat with Xanax 0.25 mg twice a day as needed. Medication side effects discussed and advised patient to abstain from alcohol. Advised to perform deep breathing exercises, journaling and meditation. Patient would let us know if symptoms not improving.

## 2023-08-23 ENCOUNTER — Other Ambulatory Visit: Payer: Self-pay | Admitting: Nurse Practitioner

## 2023-08-28 ENCOUNTER — Other Ambulatory Visit: Payer: Self-pay | Admitting: Nurse Practitioner

## 2023-09-07 ENCOUNTER — Other Ambulatory Visit: Payer: Self-pay | Admitting: Nurse Practitioner

## 2023-09-14 ENCOUNTER — Other Ambulatory Visit (INDEPENDENT_AMBULATORY_CARE_PROVIDER_SITE_OTHER): Payer: 59

## 2023-09-14 DIAGNOSIS — R7303 Prediabetes: Secondary | ICD-10-CM

## 2023-09-14 LAB — HEMOGLOBIN A1C: Hgb A1c MFr Bld: 5.5 % (ref 4.6–6.5)

## 2023-09-18 ENCOUNTER — Other Ambulatory Visit: Payer: Self-pay | Admitting: Family

## 2023-09-19 ENCOUNTER — Ambulatory Visit: Payer: 59 | Admitting: Nurse Practitioner

## 2023-09-19 ENCOUNTER — Other Ambulatory Visit: Payer: Self-pay | Admitting: Nurse Practitioner

## 2023-09-20 ENCOUNTER — Telehealth: Payer: 59 | Admitting: Physician Assistant

## 2023-09-20 DIAGNOSIS — F411 Generalized anxiety disorder: Secondary | ICD-10-CM

## 2023-09-20 NOTE — Patient Instructions (Addendum)
  Joshua Acevedo, thank you for joining Piedad Climes, [name] for today's virtual visit.  While this provider is not your primary care provider (PCP), if your PCP is located in our provider database this encounter information will be shared with them immediately following your visit.   A [facility] MyChart account gives you access to today's visit and all your visits, tests, and labs performed at [facility] " click here if you don't have a [facility] MyChart account or go to [email or web address]  Consent: (Patient) Joshua Acevedo provided verbal consent for this virtual visit at the beginning of the encounter.  Current Medications:  Current Outpatient Medications:    ALPRAZolam (XANAX) 0.25 MG tablet, TAKE 1 TABLET BY MOUTH TWICE A DAY AS NEEDED FOR ANXIETY, Disp: 30 tablet, Rfl: 0   atorvastatin (LIPITOR) 20 MG tablet, Take 1 tablet (20 mg total) by mouth every evening., Disp: 90 tablet, Rfl: 3   losartan (COZAAR) 25 MG tablet, Take 1 tablet (25 mg total) by mouth daily., Disp: 90 tablet, Rfl: 2   metFORMIN (GLUCOPHAGE) 500 MG tablet, Take 1 tablet (500 mg total) by mouth daily at 2 PM., Disp: 90 tablet, Rfl: 1   NIFEdipine (PROCARDIA-XL/NIFEDICAL-XL) 30 MG 24 hr tablet, TAKE 1 TABLET BY MOUTH EVERY DAY, Disp: 30 tablet, Rfl: 5   omeprazole (PRILOSEC) 20 MG capsule, Take 1 tablet half an hour before breakfast daily., Disp: 90 capsule, Rfl: 1   traZODone (DESYREL) 50 MG tablet, TAKE 1/2-1 TAB BY MOUTH AT BEDTIME AS NEEDED FOR SLEEP., Disp: 90 tablet, Rfl: 1   Medications ordered in this encounter:  No orders of the defined types were placed in this encounter.    *If you need refills on other medications prior to your next appointment, please contact your pharmacy*  Follow-Up: Call back or seek an in-person evaluation if the symptoms worsen or if the condition fails to improve as anticipated.  [facility] [phone]  Other Instructions Please call  the [facility]al [facility] Urgent Care for medication refill this evening.  [phone] [address]  [place], [place]    If you have been instructed to have an in-person evaluation today at a local Urgent Care facility, please use the link below. It will take you to a list of all of our available [facility], including address, phone number and hours of operation. Please do not delay care.  [facility]  If you or a family member do not have a primary care provider, use the link below to schedule a visit and establish care. When you choose a [facility] primary care physician or advanced practice provider, you gain a long-term partner in health. Find a Primary Care Provider  Learn more about [facility]'s in-office and virtual care options: [place] - Get Care Now

## 2023-09-20 NOTE — Progress Notes (Signed)
Virtual Visit Consent   Joshua Acevedo, you are scheduled for a virtual visit with a [facility] provider today. Just as with appointments in the office, your consent must be obtained to participate. Your consent will be active for this visit and any virtual visit you may have with one of our providers in the next 365 days. If you have a MyChart account, a copy of this consent can be sent to you electronically.  As this is a virtual visit, video technology does not allow for your provider to perform a traditional examination. This may limit your provider's ability to fully assess your condition. If your provider identifies any concerns that need to be evaluated in person or the need to arrange testing (such as labs, EKG, etc.), we will make arrangements to do so. Although advances in technology are sophisticated, we cannot ensure that it will always work on either your end or our end. If the connection with a video visit is poor, the visit may have to be switched to a telephone visit. With either a video or telephone visit, we are not always able to ensure that we have a secure connection.  By engaging in this virtual visit, you consent to the provision of healthcare and authorize for your insurance to be billed (if applicable) for the services provided during this visit. Depending on your insurance coverage, you may receive a charge related to this service.  I need to obtain your verbal consent now. Are you willing to proceed with your visit today? Joshua Acevedo has provided verbal consent on 09/20/2023 for a virtual visit (video or telephone). Piedad Climes, [place]  Date: 09/20/2023 4:46 PM  Virtual Visit via Video Note   I, Piedad Climes, connected with  Joshua Acevedo  ([id number], 01-09-1961) on 09/20/23 at  4:45 PM EST by a video-enabled telemedicine application and verified that I am speaking with the correct person using two identifiers.  Location: Patient: Virtual Visit Location  Patient: Home Provider: Virtual Visit Location Provider: Home Office   I discussed the limitations of evaluation and management by telemedicine and the availability of in person appointments. The patient expressed understanding and agreed to proceed.    History of Present Illness: Joshua Acevedo is a 62 y.o. who identifies as a male who was assigned male at birth, and is being seen today for refill of his Xanax. Patient endorses this is prescribed for him by his PCP office. Notes that his Wife passed away last month having to bury her around her birthday. Contacted PCP -- refilled Xanax for him but denied it because he has not be seen. Is completely out of this medicine now. Has a follow-up appt with them for 12/5 but is unsure if he can wait that long.    HPI: HPI  Problems:  Patient Active Problem List   Diagnosis Date Noted   Anxiety and depression 08/16/2023   Annual physical exam 03/26/2023   Tobacco abuse counseling 02/26/2023   Insomnia 05/11/2022   Overweight 05/11/2022   Chest pain with high risk for cardiac etiology 05/19/2020   Benign neoplasm of descending colon    Facet syndrome, lumbar 08/10/2015   Fibromyalgia 06/25/2015   Primary Raynaud's phenomenon 06/25/2015   Arthralgia of multiple joints 03/16/2015   Chronic pain associated with significant psychosocial dysfunction 03/16/2015   Family history of arthritis 03/16/2015   Fatigue 03/16/2015   Gastro-esophageal reflux disease without esophagitis 03/16/2015   Gout 03/16/2015   Pure hypercholesterolemia,  unspecified 03/16/2015   Hypertension 03/16/2015   Prediabetes 03/16/2015   Decreased libido 03/16/2015   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   Cannabis abuse 03/16/2015   Changeable mood 03/16/2015   Cramp of limb 03/16/2015   Acquired trigger finger 03/16/2015   Raynaud's syndrome 03/16/2015   Vitamin D deficiency 03/16/2015   Cramp and spasm 03/16/2015   Paroxysmal digital cyanosis 01/08/2014   SOB (shortness of  breath) on exertion 12/15/2009   Muscle ache 07/14/2009   Bone marrow failure (HCC) 07/08/2009   Low back pain 05/15/2009   Hepatitis C virus infection 06/02/2008   Malaise and fatigue 05/01/2008    Allergies: No Known Allergies Medications:  Current Outpatient Medications:    ALPRAZolam (XANAX) 0.25 MG tablet, TAKE 1 TABLET BY MOUTH TWICE A DAY AS NEEDED FOR ANXIETY, Disp: 30 tablet, Rfl: 0   atorvastatin (LIPITOR) 20 MG tablet, Take 1 tablet (20 mg total) by mouth every evening., Disp: 90 tablet, Rfl: 3   losartan (COZAAR) 25 MG tablet, Take 1 tablet (25 mg total) by mouth daily., Disp: 90 tablet, Rfl: 2   metFORMIN (GLUCOPHAGE) 500 MG tablet, Take 1 tablet (500 mg total) by mouth daily at 2 PM., Disp: 90 tablet, Rfl: 1   NIFEdipine (PROCARDIA-XL/NIFEDICAL-XL) 30 MG 24 hr tablet, TAKE 1 TABLET BY MOUTH EVERY DAY, Disp: 30 tablet, Rfl: 5   omeprazole (PRILOSEC) 20 MG capsule, Take 1 tablet half an hour before breakfast daily., Disp: 90 capsule, Rfl: 1   traZODone (DESYREL) 50 MG tablet, TAKE 1/2-1 TAB BY MOUTH AT BEDTIME AS NEEDED FOR SLEEP., Disp: 90 tablet, Rfl: 1  Observations/Objective: Patient is well-developed, well-nourished in no acute distress.  Resting comfortably  at home.  Head is normocephalic, atraumatic.  No labored breathing.  Speech is clear and coherent with logical content.  Patient is alert and oriented at baseline.    Assessment and Plan: 1. Anxiety state  Referred patient to [facility] (open 24/7) as we cannot refill controlled substances via virtual urgent care. He also needs more management than just a PRN benzodiazepine and would benefit from counseling and further pharmacotherapy which can be set up there.  Follow Up Instructions: I discussed the assessment and treatment plan with the patient. The patient was provided an opportunity to ask questions and all were answered. The patient agreed with the plan and demonstrated an understanding of the  instructions.  A copy of instructions were sent to the patient via MyChart unless otherwise noted below.    The patient was advised to call back or seek an in-person evaluation if the symptoms worsen or if the condition fails to improve as anticipated.    Piedad Climes, [name]

## 2023-09-28 ENCOUNTER — Ambulatory Visit (INDEPENDENT_AMBULATORY_CARE_PROVIDER_SITE_OTHER): Payer: 59 | Admitting: Nurse Practitioner

## 2023-09-28 ENCOUNTER — Encounter: Payer: Self-pay | Admitting: Nurse Practitioner

## 2023-09-28 VITALS — BP 122/84 | HR 77 | Temp 98.4°F | Ht 65.0 in | Wt 154.6 lb

## 2023-09-28 DIAGNOSIS — R7303 Prediabetes: Secondary | ICD-10-CM

## 2023-09-28 DIAGNOSIS — F419 Anxiety disorder, unspecified: Secondary | ICD-10-CM

## 2023-09-28 DIAGNOSIS — F32A Depression, unspecified: Secondary | ICD-10-CM | POA: Diagnosis not present

## 2023-09-28 DIAGNOSIS — I10 Essential (primary) hypertension: Secondary | ICD-10-CM | POA: Diagnosis not present

## 2023-09-28 DIAGNOSIS — G4700 Insomnia, unspecified: Secondary | ICD-10-CM | POA: Diagnosis not present

## 2023-09-28 MED ORDER — ALPRAZOLAM 0.25 MG PO TABS: 0.2500 mg | ORAL_TABLET | Freq: Two times a day (BID) | ORAL | 1 refills | Status: AC | PRN

## 2023-09-28 NOTE — Progress Notes (Signed)
Established Patient Office Visit  Subjective:  Patient ID: Joshua Acevedo, male    DOB: Jul 07, 1961  Age: 62 y.o. MRN: [id number]  CC:  Chief Complaint  Patient presents with   Medical Management of Chronic Issues    HPI  Joshua Acevedo presents for chronic disease follow-up.  He has history of hypertension, diabetes, hyperlipidemia, anxiety, insomnia and GERD.  Patient reports an increase in anxiety level following the recent death of his wife.  He states that he is trying to cope with the loss but continues to experience heightened anxiety.  The patient mentioned that Xanax has been helpful in managing his symptoms.  However he ran out of his prescription for couple of days and resorted to drinking alcohol as a coping mechanism.  He is aware that alcohol is contraindicated with Xanax and request a prescription for Xanax to avoid using alcohol as a coping mechanism.  HPI   Past Medical History:  Diagnosis Date   Arthritis    "everywhere"   GERD (gastroesophageal reflux disease)    Hepatitis 2009   "c" - treated   Hepatitis C    Hyperlipidemia    Hypertension    Shortness of breath dyspnea    Wears dentures    partial upper    Past Surgical History:  Procedure Laterality Date   COLONOSCOPY WITH PROPOFOL N/A 08/28/2015   Procedure: COLONOSCOPY WITH PROPOFOL;  Surgeon: Midge Minium, MD;  Location: [facility];  Service: Endoscopy;  Laterality: N/A;   NO PAST SURGERIES     POLYPECTOMY  08/28/2015   Procedure: POLYPECTOMY;  Surgeon: Midge Minium, MD;  Location: [facility];  Service: Endoscopy;;    Family History  Problem Relation Age of Onset   Diabetes Paternal Grandfather    Heart attack Maternal Grandmother    Heart attack Maternal Grandfather    Hypertension Mother    Cataracts Mother    Diabetes Father    Coronary artery disease Father     Social History   Socioeconomic History   Marital status: Single    Spouse name: Not on file   Number of  children: Not on file   Years of education: Not on file   Highest education level: 12th grade  Occupational History   Not on file  Tobacco Use   Smoking status: Some Days    Current packs/day: 0.75    Average packs/day: 0.7 packs/day for 40.1 years (30.1 ttl pk-yrs)    Types: Cigarettes    Start date: 04/23/2022   Smokeless tobacco: Never   Tobacco comments:    Planning to quit New Years Eve  Vaping Use   Vaping status: Never Used  Substance and Sexual Activity   Alcohol use: Not Currently    Alcohol/week: 21.0 standard drinks of alcohol    Types: 21 Shots of liquor per week   Drug use: Not Currently    Comment: previously used marijuana, quit in 2009 when diagnosed with Hepatitis C   Sexual activity: Not on file  Other Topics Concern   Not on file  Social History Narrative   Not on file   Social Drivers of Health   Financial Resource Strain: Medium Risk (09/26/2023)   Overall Financial Resource Strain (CARDIA)    Difficulty of Paying Living Expenses: Somewhat hard  Food Insecurity: No Food Insecurity (09/26/2023)   Hunger Vital Sign    Worried About Running Out of Food in the Last Year: Never true    Ran Out of  Food in the Last Year: Never true  Transportation Needs: No Transportation Needs (09/26/2023)   PRAPARE - Administrator, Civil Service (Medical): No    Lack of Transportation (Non-Medical): No  Physical Activity: Unknown (09/26/2023)   Exercise Vital Sign    Days of Exercise per Week: 5 days    Minutes of Exercise per Session: Patient declined  Stress: Stress Concern Present (09/26/2023)   [employer] of Occupational Health - Occupational Stress Questionnaire    Feeling of Stress : Very much  Social Connections: Unknown (09/26/2023)   Social Connection and Isolation Panel [NHANES]    Frequency of Communication with Friends and Family: More than three times a week    Frequency of Social Gatherings with Friends and Family: Patient declined     Attends Religious Services: Patient declined    Database administrator or Organizations: No    Attends Engineer, structural: Not on file    Marital Status: Widowed  Intimate Partner Violence: Not on file     Outpatient Medications Prior to Visit  Medication Sig Dispense Refill   ALPRAZolam (XANAX) 0.25 MG tablet TAKE 1 TABLET BY MOUTH TWICE A DAY AS NEEDED FOR ANXIETY 30 tablet 0   atorvastatin (LIPITOR) 20 MG tablet Take 1 tablet (20 mg total) by mouth every evening. 90 tablet 3   losartan (COZAAR) 25 MG tablet Take 1 tablet (25 mg total) by mouth daily. 90 tablet 2   NIFEdipine (PROCARDIA-XL/NIFEDICAL-XL) 30 MG 24 hr tablet TAKE 1 TABLET BY MOUTH EVERY DAY 30 tablet 5   omeprazole (PRILOSEC) 20 MG capsule Take 1 tablet half an hour before breakfast daily. 90 capsule 1   traZODone (DESYREL) 50 MG tablet TAKE 1/2-1 TAB BY MOUTH AT BEDTIME AS NEEDED FOR SLEEP. 90 tablet 1   metFORMIN (GLUCOPHAGE) 500 MG tablet Take 1 tablet (500 mg total) by mouth daily at 2 PM. 90 tablet 1   No facility-administered medications prior to visit.    No Known Allergies  ROS Review of Systems  Psychiatric/Behavioral:  The patient is nervous/anxious.    Negative unless indicated in HPI.    Objective:    Physical Exam Constitutional:      Appearance: Normal appearance.  HENT:     Mouth/Throat:     Mouth: Mucous membranes are moist.  Eyes:     Conjunctiva/sclera: Conjunctivae normal.     Pupils: Pupils are equal, round, and reactive to light.  Cardiovascular:     Rate and Rhythm: Normal rate and regular rhythm.     Pulses: Normal pulses.     Heart sounds: Normal heart sounds.  Pulmonary:     Effort: Pulmonary effort is normal.     Breath sounds: Normal breath sounds.  Abdominal:     General: Bowel sounds are normal.     Palpations: Abdomen is soft.  Musculoskeletal:     Cervical back: Normal range of motion. No tenderness.  Skin:    General: Skin is warm.     Findings: No  bruising.  Neurological:     General: No focal deficit present.     Mental Status: He is alert and oriented to person, place, and time. Mental status is at baseline.  Psychiatric:        Mood and Affect: Mood normal.        Behavior: Behavior normal.        Thought Content: Thought content normal.        Judgment: Judgment normal.  BP 122/84   Pulse 77   Temp 98.4 F (36.9 C)   Ht 5\' 5"  (1.651 m)   Wt 154 lb 9.6 oz (70.1 kg)   SpO2 98%   BMI 25.73 kg/m  Wt Readings from Last 3 Encounters:  09/28/23 154 lb 9.6 oz (70.1 kg)  03/17/23 158 lb 3.2 oz (71.8 kg)  02/14/23 154 lb 3.2 oz (69.9 kg)     Health Maintenance  Topic Date Due   DTaP/Tdap/Td (1 - Tdap) Never done   Lung Cancer Screening  Never done   Zoster Vaccines- Shingrix (1 of 2) Never done   Colonoscopy  08/27/2020   INFLUENZA VACCINE  01/22/2024 (Originally 05/25/2023)   Hepatitis C Screening  Completed   HIV Screening  Completed   HPV VACCINES  Aged Out   COVID-19 Vaccine  Discontinued    There are no preventive care reminders to display for this patient.  Lab Results  Component Value Date   TSH 1.75 03/09/2023   Lab Results  Component Value Date   WBC 7.1 03/09/2023   HGB 14.0 03/09/2023   HCT 40.5 03/09/2023   MCV 90.9 03/09/2023   PLT 234.0 03/09/2023   Lab Results  Component Value Date   NA 140 03/09/2023   K 4.4 03/09/2023   CO2 28 03/09/2023   GLUCOSE 120 (H) 03/09/2023   BUN 16 03/09/2023   CREATININE 0.97 03/09/2023   BILITOT 0.4 03/09/2023   ALKPHOS 64 03/09/2023   AST 22 03/09/2023   ALT 29 03/09/2023   PROT 6.4 03/09/2023   ALBUMIN 4.3 03/09/2023   CALCIUM 9.4 03/09/2023   ANIONGAP 8 05/20/2022   EGFR 90 05/10/2022   GFR 83.06 03/09/2023   Lab Results  Component Value Date   CHOL 302 (H) 03/09/2023   Lab Results  Component Value Date   HDL 43.80 03/09/2023   Lab Results  Component Value Date   LDLCALC 235 (H) 03/09/2023   Lab Results  Component Value Date    TRIG 117.0 03/09/2023   Lab Results  Component Value Date   CHOLHDL 7 03/09/2023   Lab Results  Component Value Date   HGBA1C 5.5 09/14/2023      Assessment & Plan:  Hypertension, unspecified type Assessment & Plan: Patient BP  Vitals:   09/28/23 1338  BP: 122/84    in the office. Advised pt to follow a low sodium and heart healthy diet. Continue losartan and nifedipine. Continue to monitor.   Prediabetes Assessment & Plan: Lab Results  Component Value Date   HGBA1C 5.5 09/14/2023  Continue metformin.   Discussed diet and exercise   Anxiety and depression Assessment & Plan: His PHQ-9 score is 11 and GAD score 8. Signed controlled substance contract for Xanax. Discussed the importance of avoiding alcohol while taking Xanax due to risk of serious side effects. Will continue to monitor     Insomnia, unspecified type Assessment & Plan: Stable on medication. Continue trazodone Advised to follow good sleep hygiene   Other orders -     ALPRAZolam; Take 1 tablet (0.25 mg total) by mouth 2 (two) times daily as needed for anxiety.  Dispense: 60 tablet; Refill: 1    Follow-up: Return in about 4 months (around 01/27/2024) for chronic management.   Kara Dies, NP

## 2023-09-28 NOTE — Patient Instructions (Signed)
Control substance contract signed. Prescription provided for xanax.  Medication side effect and precautions discussed.

## 2023-09-30 ENCOUNTER — Other Ambulatory Visit: Payer: Self-pay | Admitting: Nurse Practitioner

## 2023-10-12 NOTE — Assessment & Plan Note (Signed)
Stable on medication. Continue trazodone Advised to follow good sleep hygiene

## 2023-10-12 NOTE — Assessment & Plan Note (Signed)
Lab Results  Component Value Date   HGBA1C 5.5 09/14/2023  Continue metformin.   Discussed diet and exercise

## 2023-10-12 NOTE — Assessment & Plan Note (Signed)
His PHQ-9 score is 11 and GAD score 8. Signed controlled substance contract for Xanax. Discussed the importance of avoiding alcohol while taking Xanax due to risk of serious side effects. Will continue to monitor

## 2023-10-12 NOTE — Assessment & Plan Note (Signed)
Patient BP  Vitals:   09/28/23 1338  BP: 122/84    in the office. Advised pt to follow a low sodium and heart healthy diet. Continue losartan and nifedipine. Continue to monitor.

## 2023-10-19 ENCOUNTER — Telehealth: Payer: 59 | Admitting: Physician Assistant

## 2023-10-19 DIAGNOSIS — B9689 Other specified bacterial agents as the cause of diseases classified elsewhere: Secondary | ICD-10-CM | POA: Diagnosis not present

## 2023-10-19 DIAGNOSIS — J069 Acute upper respiratory infection, unspecified: Secondary | ICD-10-CM

## 2023-10-19 MED ORDER — BENZONATATE 100 MG PO CAPS: 100.0000 mg | ORAL_CAPSULE | Freq: Three times a day (TID) | ORAL | 0 refills | Status: DC | PRN

## 2023-10-19 MED ORDER — AMOXICILLIN-POT CLAVULANATE 875-125 MG PO TABS: 1.0000 | ORAL_TABLET | Freq: Two times a day (BID) | ORAL | 0 refills | Status: DC

## 2023-10-19 NOTE — Patient Instructions (Signed)
[name], thank you for joining [name], [name] for today's virtual visit.  While this provider is not your primary care provider (PCP), if your PCP is located in our provider database this encounter information will be shared with them immediately following your visit.   A [facility] MyChart account gives you access to today's visit and all your visits, tests, and labs performed at [facility] " click here if you don't have a [facility] MyChart account or go to [email or web address]  Consent: (Patient) Joshua Acevedo provided verbal consent for this virtual visit at the beginning of the encounter.  Current Medications:  Current Outpatient Medications:    amoxicillin-clavulanate (AUGMENTIN) 875-125 MG tablet, Take 1 tablet by mouth 2 (two) times daily., Disp: 20 tablet, Rfl: 0   benzonatate (TESSALON) 100 MG capsule, Take 1-2 capsules (100-200 mg total) by mouth 3 (three) times daily as needed., Disp: 30 capsule, Rfl: 0   ALPRAZolam (XANAX) 0.25 MG tablet, TAKE 1 TABLET BY MOUTH TWICE A DAY AS NEEDED FOR ANXIETY, Disp: 30 tablet, Rfl: 0   ALPRAZolam (XANAX) 0.25 MG tablet, Take 1 tablet (0.25 mg total) by mouth 2 (two) times daily as needed for anxiety., Disp: 60 tablet, Rfl: 1   atorvastatin (LIPITOR) 20 MG tablet, Take 1 tablet (20 mg total) by mouth every evening., Disp: 90 tablet, Rfl: 3   losartan (COZAAR) 25 MG tablet, Take 1 tablet (25 mg total) by mouth daily., Disp: 90 tablet, Rfl: 2   metFORMIN (GLUCOPHAGE) 500 MG tablet, TAKE 1 TABLET (500 MG TOTAL) BY MOUTH DAILY AT 2 PM., Disp: 30 tablet, Rfl: 5   NIFEdipine (PROCARDIA-XL/NIFEDICAL-XL) 30 MG 24 hr tablet, TAKE 1 TABLET BY MOUTH EVERY DAY, Disp: 30 tablet, Rfl: 5   omeprazole (PRILOSEC) 20 MG capsule, Take 1 tablet half an hour before breakfast daily., Disp: 90 capsule, Rfl: 1   traZODone (DESYREL) 50 MG tablet, TAKE 1/2-1 TAB BY MOUTH AT BEDTIME AS NEEDED FOR SLEEP., Disp: 90 tablet, Rfl: 1    Medications ordered in this encounter:  Meds ordered this encounter  Medications   amoxicillin-clavulanate (AUGMENTIN) 875-125 MG tablet    Sig: Take 1 tablet by mouth 2 (two) times daily.    Dispense:  20 tablet    Refill:  0    Supervising Provider:   Merrilee Jansky [[id number]]   benzonatate (TESSALON) 100 MG capsule    Sig: Take 1-2 capsules (100-200 mg total) by mouth 3 (three) times daily as needed.    Dispense:  30 capsule    Refill:  0    Supervising Provider:   Merrilee Jansky [[id number]]     *If you need refills on other medications prior to your next appointment, please contact your pharmacy*  Follow-Up: Call back or seek an in-person evaluation if the symptoms worsen or if the condition fails to improve as anticipated.  [facility] [phone]  Other Instructions Upper Respiratory Infection, Adult An upper respiratory infection (URI) is a common viral infection of the nose, throat, and upper air passages that lead to the lungs. The most common type of URI is the common cold. URIs usually get better on their own, without medical treatment. What are the causes? A URI is caused by a virus. You may catch a virus by: Breathing in droplets from an infected person's cough or sneeze. Touching something that has been exposed to the virus (is contaminated) and then touching your mouth, nose, or eyes. What increases  the risk? You are more likely to get a URI if: You are very young or very old. You have close contact with others, such as at work, school, or a health care facility. You smoke. You have long-term (chronic) heart or lung disease. You have a weakened disease-fighting system (immune system). You have nasal allergies or asthma. You are experiencing a lot of stress. You have poor nutrition. What are the signs or symptoms? A URI usually involves some of the following symptoms: Runny or stuffy (congested) nose. Cough. Sneezing. Sore  throat. Headache. Fatigue. Fever. Loss of appetite. Pain in your forehead, behind your eyes, and over your cheekbones (sinus pain). Muscle aches. Redness or irritation of the eyes. Pressure in the ears or face. How is this diagnosed? This condition may be diagnosed based on your medical history and symptoms, and a physical exam. Your health care provider may use a swab to take a mucus sample from your nose (nasal swab). This sample can be tested to determine what virus is causing the illness. How is this treated? URIs usually get better on their own within 7-10 days. Medicines cannot cure URIs, but your health care provider may recommend certain medicines to help relieve symptoms, such as: Over-the-counter cold medicines. Cough suppressants. Coughing is a type of defense against infection that helps to clear the respiratory system, so take these medicines only as recommended by your health care provider. Fever-reducing medicines. Follow these instructions at home: Activity Rest as needed. If you have a fever, stay home from work or school until your fever is gone or until your health care provider says your URI cannot spread to other people (is no longer contagious). Your health care provider may have you wear a face mask to prevent your infection from spreading. Relieving symptoms Gargle with a mixture of salt and water 3-4 times a day or as needed. To make salt water, completely dissolve -1 tsp (3-6 g) of salt in 1 cup (237 mL) of warm water. Use a cool-mist humidifier to add moisture to the air. This can help you breathe more easily. Eating and drinking  Drink enough fluid to keep your urine pale yellow. Eat soups and other clear broths. General instructions  Take over-the-counter and prescription medicines only as told by your health care provider. These include cold medicines, fever reducers, and cough suppressants. Do not use any products that contain nicotine or tobacco. These  products include cigarettes, chewing tobacco, and vaping devices, such as e-cigarettes. If you need help quitting, ask your health care provider. Stay away from secondhand smoke. Stay up to date on all immunizations, including the yearly (annual) flu vaccine. Keep all follow-up visits. This is important. How to prevent the spread of infection to others URIs can be contagious. To prevent the infection from spreading: Wash your hands with soap and water for at least 20 seconds. If soap and water are not available, use hand sanitizer. Avoid touching your mouth, face, eyes, or nose. Cough or sneeze into a tissue or your sleeve or elbow instead of into your hand or into the air.  Contact a health care provider if: You are getting worse instead of better. You have a fever or chills. Your mucus is brown or red. You have yellow or brown discharge coming from your nose. You have pain in your face, especially when you bend forward. You have swollen neck glands. You have pain while swallowing. You have white areas in the back of your throat. Get help right  away if: You have shortness of breath that gets worse. You have severe or persistent: Headache. Ear pain. Sinus pain. Chest pain. You have chronic lung disease along with any of the following: Making high-pitched whistling sounds when you breathe, most often when you breathe out (wheezing). Prolonged cough (more than 14 days). Coughing up blood. A change in your usual mucus. You have a stiff neck. You have changes in your: Vision. Hearing. Thinking. Mood. These symptoms may be an emergency. Get help right away. Call 911. Do not wait to see if the symptoms will go away. Do not drive yourself to the hospital. Summary An upper respiratory infection (URI) is a common infection of the nose, throat, and upper air passages that lead to the lungs. A URI is caused by a virus. URIs usually get better on their own within 7-10 days. Medicines  cannot cure URIs, but your health care provider may recommend certain medicines to help relieve symptoms. This information is not intended to replace advice given to you by your health care provider. Make sure you discuss any questions you have with your health care provider. Document Revised: 05/12/2021 Document Reviewed: 05/12/2021 Elsevier Patient Education  2024 [employer]vier Inc.    If you have been instructed to have an in-person evaluation today at a local Urgent Care facility, please use the link below. It will take you to a list of all of our available [facility], including address, phone number and hours of operation. Please do not delay care.  [facility]  If you or a family member do not have a primary care provider, use the link below to schedule a visit and establish care. When you choose a [facility] primary care physician or advanced practice provider, you gain a long-term partner in health. Find a Primary Care Provider  Learn more about [facility]'s in-office and virtual care options:  - Get Care Now

## 2023-10-19 NOTE — Progress Notes (Signed)
Virtual Visit Consent   Joshua Acevedo, you are scheduled for a virtual visit with a [facility] provider today. Just as with appointments in the office, your consent must be obtained to participate. Your consent will be active for this visit and any virtual visit you may have with one of our providers in the next 365 days. If you have a MyChart account, a copy of this consent can be sent to you electronically.  As this is a virtual visit, video technology does not allow for your provider to perform a traditional examination. This may limit your provider's ability to fully assess your condition. If your provider identifies any concerns that need to be evaluated in person or the need to arrange testing (such as labs, EKG, etc.), we will make arrangements to do so. Although advances in technology are sophisticated, we cannot ensure that it will always work on either your end or our end. If the connection with a video visit is poor, the visit may have to be switched to a telephone visit. With either a video or telephone visit, we are not always able to ensure that we have a secure connection.  By engaging in this virtual visit, you consent to the provision of healthcare and authorize for your insurance to be billed (if applicable) for the services provided during this visit. Depending on your insurance coverage, you may receive a charge related to this service.  I need to obtain your verbal consent now. Are you willing to proceed with your visit today? Joshua Acevedo has provided verbal consent on 10/19/2023 for a virtual visit (video or telephone). Joshua Loveless, [name]  Date: 10/19/2023 8:50 AM  Virtual Visit via Video Note   I, Joshua Acevedo, connected with  Joshua Acevedo  ([id number], 19-Jul-1961) on 10/19/23 at  8:45 AM EST by a video-enabled telemedicine application and verified that I am speaking with the correct person using two identifiers.  Location: Patient: Virtual Visit Location  Patient: Home Provider: Virtual Visit Location Provider: Home Office   I discussed the limitations of evaluation and management by telemedicine and the availability of in person appointments. The patient expressed understanding and agreed to proceed.    History of Present Illness: Joshua Acevedo is a 62 y.o. who identifies as a male who was assigned male at birth, and is being seen today for URI symptoms.  HPI: URI  This is a new problem. The current episode started in the past 7 days. The problem has been gradually worsening. There has been no fever. Associated symptoms include congestion, coughing, headaches, rhinorrhea (and post nasal drainage), sinus pain, sneezing and wheezing (mild). Pertinent negatives include no chest pain, diarrhea, ear pain, nausea, plugged ear sensation, sore throat or vomiting. Treatments tried: Coricidin HBP. The treatment provided no relief.  Negative at home Covid 19 testing x 2    Problems:  Patient Active Problem List   Diagnosis Date Noted   Anxiety and depression 08/16/2023   Annual physical exam 03/26/2023   Tobacco abuse counseling 02/26/2023   Insomnia 05/11/2022   Overweight 05/11/2022   Chest pain with high risk for cardiac etiology 05/19/2020   Benign neoplasm of descending colon    Facet syndrome, lumbar 08/10/2015   Fibromyalgia 06/25/2015   Primary Raynaud's phenomenon 06/25/2015   Arthralgia of multiple joints 03/16/2015   Chronic pain associated with significant psychosocial dysfunction 03/16/2015   Family history of arthritis 03/16/2015   Fatigue 03/16/2015   Gastro-esophageal reflux disease without  esophagitis 03/16/2015   Gout 03/16/2015   Pure hypercholesterolemia, unspecified 03/16/2015   Hypertension 03/16/2015   Prediabetes 03/16/2015   Decreased libido 03/16/2015   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   Cannabis abuse 03/16/2015   Changeable mood 03/16/2015   Cramp of limb 03/16/2015   Acquired trigger finger 03/16/2015    Raynaud's syndrome 03/16/2015   Vitamin D deficiency 03/16/2015   Cramp and spasm 03/16/2015   Paroxysmal digital cyanosis 01/08/2014   SOB (shortness of breath) on exertion 12/15/2009   Muscle ache 07/14/2009   Bone marrow failure (HCC) 07/08/2009   Low back pain 05/15/2009   Hepatitis C virus infection 06/02/2008   Malaise and fatigue 05/01/2008    Allergies: No Known Allergies Medications:  Current Outpatient Medications:    amoxicillin-clavulanate (AUGMENTIN) 875-125 MG tablet, Take 1 tablet by mouth 2 (two) times daily., Disp: 20 tablet, Rfl: 0   benzonatate (TESSALON) 100 MG capsule, Take 1-2 capsules (100-200 mg total) by mouth 3 (three) times daily as needed., Disp: 30 capsule, Rfl: 0   ALPRAZolam (XANAX) 0.25 MG tablet, TAKE 1 TABLET BY MOUTH TWICE A DAY AS NEEDED FOR ANXIETY, Disp: 30 tablet, Rfl: 0   ALPRAZolam (XANAX) 0.25 MG tablet, Take 1 tablet (0.25 mg total) by mouth 2 (two) times daily as needed for anxiety., Disp: 60 tablet, Rfl: 1   atorvastatin (LIPITOR) 20 MG tablet, Take 1 tablet (20 mg total) by mouth every evening., Disp: 90 tablet, Rfl: 3   losartan (COZAAR) 25 MG tablet, Take 1 tablet (25 mg total) by mouth daily., Disp: 90 tablet, Rfl: 2   metFORMIN (GLUCOPHAGE) 500 MG tablet, TAKE 1 TABLET (500 MG TOTAL) BY MOUTH DAILY AT 2 PM., Disp: 30 tablet, Rfl: 5   NIFEdipine (PROCARDIA-XL/NIFEDICAL-XL) 30 MG 24 hr tablet, TAKE 1 TABLET BY MOUTH EVERY DAY, Disp: 30 tablet, Rfl: 5   omeprazole (PRILOSEC) 20 MG capsule, Take 1 tablet half an hour before breakfast daily., Disp: 90 capsule, Rfl: 1   traZODone (DESYREL) 50 MG tablet, TAKE 1/2-1 TAB BY MOUTH AT BEDTIME AS NEEDED FOR SLEEP., Disp: 90 tablet, Rfl: 1  Observations/Objective: Patient is well-developed, well-nourished in no acute distress.  Resting comfortably at home.  Head is normocephalic, atraumatic.  No labored breathing.  Speech is clear and coherent with logical content.  Patient is alert and oriented at  baseline.    Assessment and Plan: 1. Bacterial upper respiratory infection (Primary) - amoxicillin-clavulanate (AUGMENTIN) 875-125 MG tablet; Take 1 tablet by mouth 2 (two) times daily.  Dispense: 20 tablet; Refill: 0 - benzonatate (TESSALON) 100 MG capsule; Take 1-2 capsules (100-200 mg total) by mouth 3 (three) times daily as needed.  Dispense: 30 capsule; Refill: 0  - Worsening over a week despite OTC medications - Will treat with Augmentin and tessalon perles - Can continue Coricidin HBP - Push fluids.  - Rest.  - Steam and humidifier can help - Seek in person evaluation if worsening or symptoms fail to improve    Follow Up Instructions: I discussed the assessment and treatment plan with the patient. The patient was provided an opportunity to ask questions and all were answered. The patient agreed with the plan and demonstrated an understanding of the instructions.  A copy of instructions were sent to the patient via MyChart unless otherwise noted below.    The patient was advised to call back or seek an in-person evaluation if the symptoms worsen or if the condition fails to improve as anticipated.    Joshua Loveless,  [name]

## 2023-10-31 ENCOUNTER — Other Ambulatory Visit: Payer: Self-pay | Admitting: Nurse Practitioner

## 2023-11-26 ENCOUNTER — Other Ambulatory Visit: Payer: Self-pay | Admitting: Nurse Practitioner

## 2023-11-27 NOTE — Telephone Encounter (Signed)
Patient needs a PA for Prevacid.

## 2023-11-30 ENCOUNTER — Other Ambulatory Visit (HOSPITAL_COMMUNITY): Payer: Self-pay

## 2023-11-30 ENCOUNTER — Telehealth: Payer: Self-pay

## 2023-11-30 NOTE — Telephone Encounter (Signed)
 Pharmacy Patient Advocate Encounter  Received notification from [employer] that Prior Authorization for Lansoprazole 30MG  dr capsules has been APPROVED from 11/30/2023 to 11/29/2024. Ran test claim, Copay is $4.94. This test claim was processed through [facility]- copay amounts may vary at other pharmacies due to pharmacy/plan contracts, or as the patient moves through the different stages of their insurance plan.   PA #/Case ID/Reference #: [id number] KEY: [id number]

## 2023-11-30 NOTE — Telephone Encounter (Signed)
 PA request has been Approved. New Encounter created for follow up. For additional info see Pharmacy Prior Auth telephone encounter from 11/30/2023

## 2023-12-05 NOTE — Telephone Encounter (Signed)
PA request has been Approved. New Encounter created for follow up. For additional info see Pharmacy Prior Auth telephone encounter from 11/30/2023

## 2023-12-30 ENCOUNTER — Other Ambulatory Visit: Payer: Self-pay | Admitting: Nurse Practitioner

## 2024-01-26 ENCOUNTER — Other Ambulatory Visit: Payer: Self-pay | Admitting: Nurse Practitioner

## 2024-01-27 ENCOUNTER — Other Ambulatory Visit: Payer: Self-pay | Admitting: Nurse Practitioner

## 2024-01-30 ENCOUNTER — Ambulatory Visit (INDEPENDENT_AMBULATORY_CARE_PROVIDER_SITE_OTHER): Payer: 59 | Admitting: Nurse Practitioner

## 2024-01-30 ENCOUNTER — Encounter: Payer: Self-pay | Admitting: Nurse Practitioner

## 2024-01-30 VITALS — BP 116/74 | HR 67 | Temp 98.0°F | Ht 65.0 in | Wt 153.0 lb

## 2024-01-30 DIAGNOSIS — E785 Hyperlipidemia, unspecified: Secondary | ICD-10-CM

## 2024-01-30 DIAGNOSIS — I10 Essential (primary) hypertension: Secondary | ICD-10-CM

## 2024-01-30 DIAGNOSIS — Z716 Tobacco abuse counseling: Secondary | ICD-10-CM

## 2024-01-30 DIAGNOSIS — R7303 Prediabetes: Secondary | ICD-10-CM | POA: Diagnosis not present

## 2024-01-30 DIAGNOSIS — G4700 Insomnia, unspecified: Secondary | ICD-10-CM

## 2024-01-30 DIAGNOSIS — Z860100 Personal history of colon polyps, unspecified: Secondary | ICD-10-CM | POA: Diagnosis not present

## 2024-01-30 DIAGNOSIS — F32A Depression, unspecified: Secondary | ICD-10-CM

## 2024-01-30 DIAGNOSIS — Z0000 Encounter for general adult medical examination without abnormal findings: Secondary | ICD-10-CM

## 2024-01-30 DIAGNOSIS — F419 Anxiety disorder, unspecified: Secondary | ICD-10-CM

## 2024-01-30 MED ORDER — METFORMIN HCL 500 MG PO TABS: ORAL_TABLET | ORAL | Status: DC

## 2024-01-30 MED ORDER — NIFEDIPINE ER OSMOTIC RELEASE 30 MG PO TB24: 30.0000 mg | ORAL_TABLET | Freq: Every day | ORAL | 1 refills | Status: DC

## 2024-01-30 MED ORDER — NICOTINE 14 MG/24HR TD PT24: 14.0000 mg | MEDICATED_PATCH | Freq: Every day | TRANSDERMAL | 5 refills | Status: AC

## 2024-01-30 NOTE — Patient Instructions (Signed)
Nicotine Patches What is this medication? NICOTINE (NIK oh teen) helps you quit smoking. It reduces cravings for nicotine, the addictive substance found in tobacco. It may also help reduce symptoms of withdrawal. It is most effective when used in combination with a stop-smoking program. This medicine may be used for other purposes; ask your health care provider or pharmacist if you have questions. COMMON BRAND NAME(S): Habitrol, Nicoderm CQ, Nicotrol What should I tell my care team before I take this medication? They need to know if you have any of these conditions: Diabetes Heart disease, angina, irregular heartbeat, or previous heart attack High blood pressure High thyroid levels Lung or breathing disease, such as asthma Pheochromocytoma Seizures or a history of seizures Skin problems, such as eczema Stomach ulcers, other stomach or intestine problems An unusual or allergic reaction to nicotine, adhesives, other medications, foods, dyes, or preservatives Pregnant or trying to get pregnant Breastfeeding How should I use this medication? This medication is for external use only. Follow the directions on the label. Do not cut or trim the patch. After applying the patch, wash your hands. Change the patch every day in the morning, keeping to a regular schedule. When you apply a new patch, use a new area of skin. Wait at least 1 week before using the same area again. This medication comes with INSTRUCTIONS FOR USE. Ask your pharmacist for directions on how to use this medication. Read the information carefully. Talk to your pharmacist or care team if you have questions. Talk to your care team about the use of this medication in children. Special care may be needed. Overdosage: If you think you have taken too much of this medicine contact a poison control center or emergency room at once. NOTE: This medicine is only for you. Do not share this medicine with others. What if I miss a dose? If  you forget to replace a patch, use it as soon as you can. Only use one patch at a time and do not leave on the skin for longer than directed. If a patch falls off, you can replace it, but keep to your schedule and remove the patch at the right time. What may interact with this medication? Certain medications for lung or breathing disease, such as asthma Medications for blood pressure Medications for depression This list may not describe all possible interactions. Give your health care provider a list of all the medicines, herbs, non-prescription drugs, or dietary supplements you use. Also tell them if you smoke, drink alcohol, or use illegal drugs. Some items may interact with your medicine. What should I watch for while using this medication? Visit your care team for regular checks on your progress. Talk to your care team about what you can do to improve your chances of quitting. If you are going to need surgery, an MRI, CT scan, or other procedure, tell your care team that you are using this medication. You may need to remove the patch before the procedure. What side effects may I notice from receiving this medication? Side effects that you should report to your care team as soon as possible: Allergic reactions--skin rash, itching, hives, swelling of the face, lips, tongue, or throat Heart palpitations--rapid, pounding, or irregular heartbeat Increase in blood pressure Side effects that usually do not require medical attention (report to your care team if they continue or are bothersome): Dizziness Headache Heartburn Hiccups Irritation at application site Trouble sleeping This list may not describe all possible side effects. Call  your doctor for medical advice about side effects. You may report side effects to FDA at 1-800-FDA-1088. Where should I keep my medication? This product may have enough nicotine in it to make children and pets sick. Keep it away from children and pets. After using,  throw away as directed on the package. Store at room temperature between 20 and 25 degrees C (68 and 77 degrees F). Protect from heat and light. Keep this medication in the original container until ready to use. Throw away unused medication after the expiration date. NOTE: This sheet is a summary. It may not cover all possible information. If you have questions about this medicine, talk to your doctor, pharmacist, or health care provider.  2024 Elsevier/Gold Standard (2022-10-10 00:00:00)

## 2024-01-30 NOTE — Progress Notes (Signed)
 Established Patient Office Visit  Subjective:  Patient ID: Joshua Acevedo, male    DOB: 10-04-1961  Age: 63 y.o. MRN: [id number]  CC:  Chief Complaint  Patient presents with   Medical Management of Chronic Issues    HPI  Joshua Acevedo presents for chronic disease follow up.  Anxiety: Has been taking xanax daily. Occasionally drink beer. Had quit hard liquor.    Hypertension: Denies chest pain, shortness of breath or swelling.  Compliant with medication  Prediabetes: Does not check blood sugar at home, compliant with metformin  Hyperlipidemia: Trying to eat healthy, compliant with Lipitor, no muscle pain  Insomnia: Takes trazodone daily.  HPI   Past Medical History:  Diagnosis Date   Arthritis    "everywhere"   GERD (gastroesophageal reflux disease)    Hepatitis 2009   "c" - treated   Hepatitis C    Hyperlipidemia    Hypertension    Shortness of breath dyspnea    Wears dentures    partial upper    Past Surgical History:  Procedure Laterality Date   COLONOSCOPY WITH PROPOFOL N/A 08/28/2015   Procedure: COLONOSCOPY WITH PROPOFOL;  Surgeon: Marnee Sink, MD;  Location: [facility];  Service: Endoscopy;  Laterality: N/A;   NO PAST SURGERIES     POLYPECTOMY  08/28/2015   Procedure: POLYPECTOMY;  Surgeon: Marnee Sink, MD;  Location: [facility];  Service: Endoscopy;;    Family History  Problem Relation Age of Onset   Diabetes Paternal Grandfather    Heart attack Maternal Grandmother    Heart attack Maternal Grandfather    Hypertension Mother    Cataracts Mother    Diabetes Father    Coronary artery disease Father     Social History   Socioeconomic History   Marital status: Single    Spouse name: Not on file   Number of children: Not on file   Years of education: Not on file   Highest education level: 12th grade  Occupational History   Not on file  Tobacco Use   Smoking status: Some Days    Current packs/day: 0.75    Average packs/day:  0.7 packs/day for 40.4 years (30.3 ttl pk-yrs)    Types: Cigarettes    Start date: 04/23/2022   Smokeless tobacco: Never   Tobacco comments:    1 pack a week  Vaping Use   Vaping status: Never Used  Substance and Sexual Activity   Alcohol use: Not Currently    Comment: occasionally drink beer   Drug use: Not Currently    Comment: previously used marijuana, quit in 2009 when diagnosed with Hepatitis C   Sexual activity: Not on file  Other Topics Concern   Not on file  Social History Narrative   Not on file   Social Drivers of Health   Financial Resource Strain: Medium Risk (01/26/2024)   Overall Financial Resource Strain (CARDIA)    Difficulty of Paying Living Expenses: Somewhat hard  Food Insecurity: No Food Insecurity (01/26/2024)   Hunger Vital Sign    Worried About Running Out of Food in the Last Year: Never true    Ran Out of Food in the Last Year: Never true  Transportation Needs: No Transportation Needs (01/26/2024)   PRAPARE - Administrator, Civil Service (Medical): No    Lack of Transportation (Non-Medical): No  Physical Activity: Sufficiently Active (01/26/2024)   Exercise Vital Sign    Days of Exercise per Week: 7 days  Minutes of Exercise per Session: 30 min  Stress: Stress Concern Present (01/26/2024)   [employer] of Occupational Health - Occupational Stress Questionnaire    Feeling of Stress : Very much  Social Connections: Socially Isolated (01/26/2024)   Social Connection and Isolation Panel [NHANES]    Frequency of Communication with Friends and Family: Three times a week    Frequency of Social Gatherings with Friends and Family: Never    Attends Religious Services: Never    Database administrator or Organizations: No    Attends Engineer, structural: Not on file    Marital Status: Widowed  Intimate Partner Violence: Not on file     Outpatient Medications Prior to Visit  Medication Sig Dispense Refill   ALPRAZolam (XANAX) 0.25 MG  tablet TAKE 1 TABLET BY MOUTH TWICE A DAY AS NEEDED FOR ANXIETY 30 tablet 0   ALPRAZolam (XANAX) 0.25 MG tablet Take 1 tablet (0.25 mg total) by mouth 2 (two) times daily as needed for anxiety. 60 tablet 1   ALPRAZolam (XANAX) 0.25 MG tablet TAKE 1 TABLET BY MOUTH TWICE A DAY AS NEEDED FOR ANXIETY 60 tablet 1   atorvastatin (LIPITOR) 20 MG tablet Take 1 tablet (20 mg total) by mouth every evening. 90 tablet 3   losartan (COZAAR) 25 MG tablet TAKE 1 TABLET (25 MG TOTAL) BY MOUTH DAILY. 30 tablet 11   omeprazole (PRILOSEC) 20 MG capsule Take 1 tablet half an hour before breakfast daily. 90 capsule 1   traZODone (DESYREL) 50 MG tablet TAKE 1/2 TO 1 TABLET BY MOUTH AT BEDTIME AS NEEDED FOR SLEEP 90 tablet 2   metFORMIN (GLUCOPHAGE) 500 MG tablet TAKE 1 TABLET (500 MG TOTAL) BY MOUTH DAILY AT 2 PM. 30 tablet 5   NIFEdipine (PROCARDIA-XL/NIFEDICAL-XL) 30 MG 24 hr tablet TAKE 1 TABLET BY MOUTH EVERY DAY 30 tablet 5   amoxicillin-clavulanate (AUGMENTIN) 875-125 MG tablet Take 1 tablet by mouth 2 (two) times daily. 20 tablet 0   benzonatate (TESSALON) 100 MG capsule Take 1-2 capsules (100-200 mg total) by mouth 3 (three) times daily as needed. 30 capsule 0   No facility-administered medications prior to visit.    No Known Allergies  ROS Review of Systems Negative unless indicated in HPI.    Objective:    Physical Exam Constitutional:      Appearance: Normal appearance.  HENT:     Mouth/Throat:     Mouth: Mucous membranes are moist.  Eyes:     Conjunctiva/sclera: Conjunctivae normal.     Pupils: Pupils are equal, round, and reactive to light.  Cardiovascular:     Rate and Rhythm: Normal rate and regular rhythm.     Pulses: Normal pulses.     Heart sounds: Normal heart sounds.  Pulmonary:     Effort: Pulmonary effort is normal.     Breath sounds: Normal breath sounds.  Abdominal:     General: Bowel sounds are normal.     Palpations: Abdomen is soft.  Musculoskeletal:     Cervical  back: Normal range of motion. No tenderness.  Skin:    General: Skin is warm.     Findings: No bruising.  Neurological:     General: No focal deficit present.     Mental Status: He is alert and oriented to person, place, and time. Mental status is at baseline.  Psychiatric:        Mood and Affect: Mood normal.        Behavior: Behavior normal.  Thought Content: Thought content normal.        Judgment: Judgment normal.     BP 116/74   Pulse 67   Temp 98 F (36.7 C)   Ht 5\' 5"  (1.651 m)   Wt 153 lb (69.4 kg)   SpO2 97%   BMI 25.46 kg/m  Wt Readings from Last 3 Encounters:  01/30/24 153 lb (69.4 kg)  09/28/23 154 lb 9.6 oz (70.1 kg)  03/17/23 158 lb 3.2 oz (71.8 kg)     Health Maintenance  Topic Date Due   Lung Cancer Screening  Never done   Colonoscopy  08/27/2020   Zoster Vaccines- Shingrix (1 of 2) 04/30/2024 (Originally 08/19/2009)   DTaP/Tdap/Td (1 - Tdap) 01/29/2025 (Originally 08/19/1978)   Pneumococcal Vaccine 90-78 Years old (1 of 2 - PCV) 01/29/2025 (Originally 08/19/1978)   INFLUENZA VACCINE  05/24/2024   Hepatitis C Screening  Completed   HIV Screening  Completed   HPV VACCINES  Aged Out   Meningococcal B Vaccine  Aged Out   COVID-19 Vaccine  Discontinued    There are no preventive care reminders to display for this patient.  Lab Results  Component Value Date   TSH 1.75 03/09/2023   Lab Results  Component Value Date   WBC 7.1 03/09/2023   HGB 14.0 03/09/2023   HCT 40.5 03/09/2023   MCV 90.9 03/09/2023   PLT 234.0 03/09/2023   Lab Results  Component Value Date   NA 139 01/30/2024   K 3.8 01/30/2024   CO2 27 01/30/2024   GLUCOSE 87 01/30/2024   BUN 14 01/30/2024   CREATININE 1.09 01/30/2024   BILITOT 0.5 01/30/2024   ALKPHOS 70 01/30/2024   AST 16 01/30/2024   ALT 18 01/30/2024   PROT 6.9 01/30/2024   ALBUMIN 4.8 01/30/2024   CALCIUM 9.5 01/30/2024   ANIONGAP 8 05/20/2022   EGFR 90 05/10/2022   GFR 71.75 01/30/2024   Lab  Results  Component Value Date   CHOL 202 (H) 01/30/2024   Lab Results  Component Value Date   HDL 37.80 (L) 01/30/2024   Lab Results  Component Value Date   LDLCALC 143 (H) 01/30/2024   Lab Results  Component Value Date   TRIG 106.0 01/30/2024   Lab Results  Component Value Date   CHOLHDL 5 01/30/2024   Lab Results  Component Value Date   HGBA1C 5.5 09/14/2023      Assessment & Plan:  Hypertension, unspecified type Assessment & Plan: Patient BP  Vitals:   01/30/24 1304  BP: 116/74    in the office. -Advised pt to follow a low sodium and heart healthy diet. -Continue losartan and nifedipine. -Continue to monitor.  Orders: -     Lipid panel -     Comprehensive metabolic panel with GFR  Prediabetes Assessment & Plan: Lab Results  Component Value Date   HGBA1C 5.5 09/14/2023  -Continue metformin.   -Discussed diet and exercise  Orders: -     Microalbumin / creatinine urine ratio  History of colon polyps -     Ambulatory referral to Gastroenterology  Tobacco abuse counseling Assessment & Plan: Smoking cessation was discussed, 5-7 minutes was spent of this topic specifically.  -Information provided for smoking cessation. - Handout provided for supplemental information. -Started on nicotine patch.  Orders: -     Ambulatory Referral for Lung Cancer Scre -     Nicotine; Place 1 patch (14 mg total) onto the skin daily.  Dispense: 90 patch; Refill:  5  Insomnia, unspecified type Assessment & Plan: Stable on medication. -Continue trazodone -Advised to follow good sleep hygiene   Hyperlipidemia, unspecified hyperlipidemia type Assessment & Plan: Continue statin therapy for cardiovascular risk reduction. - Will check lipid panel.     Anxiety and depression Assessment & Plan: His PHQ-9 score is 2 and GAD score 6. - Stable on Xanax -Discussed the importance of avoiding alcohol while taking Xanax due to risk of serious side effects.      Other  orders -     NIFEdipine ER Osmotic Release; Take 1 tablet (30 mg total) by mouth daily.  Dispense: 90 tablet; Refill: 1 -     metFORMIN HCl; Take 1 tablet once a day   Patient declined Shingles, Dtap  and Pnemonia vaccine.  Follow-up: Return in about 4 months (around 05/31/2024).   Bernadene Garside, NP

## 2024-01-31 LAB — LIPID PANEL
Cholesterol: 202 mg/dL — ABNORMAL HIGH (ref 0–200)
HDL: 37.8 mg/dL — ABNORMAL LOW (ref >39.00)
LDL Cholesterol: 143 mg/dL — ABNORMAL HIGH (ref 0–99)
NonHDL: 164.14
Total CHOL/HDL Ratio: 5
Triglycerides: 106 mg/dL (ref 0.0–149.0)
VLDL: 21.2 mg/dL (ref 0.0–40.0)

## 2024-01-31 LAB — COMPREHENSIVE METABOLIC PANEL WITH GFR
ALT: 18 U/L (ref 0–53)
AST: 16 U/L (ref 0–37)
Albumin: 4.8 g/dL (ref 3.5–5.2)
Alkaline Phosphatase: 70 U/L (ref 39–117)
BUN: 14 mg/dL (ref 6–23)
CO2: 27 meq/L (ref 19–32)
Calcium: 9.5 mg/dL (ref 8.4–10.5)
Chloride: 103 meq/L (ref 96–112)
Creatinine, Ser: 1.09 mg/dL (ref 0.40–1.50)
GFR: 71.75 mL/min (ref >60.00)
Glucose, Bld: 87 mg/dL (ref 70–99)
Potassium: 3.8 meq/L (ref 3.5–5.1)
Sodium: 139 meq/L (ref 135–145)
Total Bilirubin: 0.5 mg/dL (ref 0.2–1.2)
Total Protein: 6.9 g/dL (ref 6.0–8.3)

## 2024-01-31 LAB — MICROALBUMIN / CREATININE URINE RATIO
Creatinine,U: 55.4 mg/dL
Microalb Creat Ratio: 82.6 mg/g — ABNORMAL HIGH (ref 0.0–30.0)
Microalb, Ur: 4.6 mg/dL — ABNORMAL HIGH (ref 0.0–1.9)

## 2024-02-05 DIAGNOSIS — E785 Hyperlipidemia, unspecified: Secondary | ICD-10-CM | POA: Insufficient documentation

## 2024-02-05 NOTE — Assessment & Plan Note (Signed)
 Smoking cessation was discussed, 5-7 minutes was spent of this topic specifically.  -Information provided for smoking cessation. - Handout provided for supplemental information. -Started on nicotine patch.

## 2024-02-05 NOTE — Assessment & Plan Note (Signed)
 Patient BP  Vitals:   01/30/24 1304  BP: 116/74    in the office. -Advised pt to follow a low sodium and heart healthy diet. -Continue losartan and nifedipine. -Continue to monitor.

## 2024-02-05 NOTE — Assessment & Plan Note (Signed)
Continue statin therapy for cardiovascular risk reduction. Will check lipid panel.

## 2024-02-05 NOTE — Assessment & Plan Note (Addendum)
 Lab Results  Component Value Date   HGBA1C 5.5 09/14/2023  Continue metformin.   Discussed diet and exercise

## 2024-02-05 NOTE — Assessment & Plan Note (Signed)
 His PHQ-9 score is 2 and GAD score 6. - Stable on Xanax -Discussed the importance of avoiding alcohol while taking Xanax due to risk of serious side effects.

## 2024-02-05 NOTE — Assessment & Plan Note (Signed)
 Stable on medication. Continue trazodone Advised to follow good sleep hygiene

## 2024-03-26 ENCOUNTER — Other Ambulatory Visit: Payer: Self-pay | Admitting: Nurse Practitioner

## 2024-03-27 ENCOUNTER — Other Ambulatory Visit: Payer: Self-pay | Admitting: Nurse Practitioner

## 2024-03-29 ENCOUNTER — Other Ambulatory Visit: Payer: Self-pay | Admitting: Nurse Practitioner

## 2024-04-02 NOTE — Telephone Encounter (Signed)
Controlled substance database reviewed.  Medication sent to pharmacy.

## 2024-05-27 ENCOUNTER — Other Ambulatory Visit: Payer: Self-pay | Admitting: Nurse Practitioner

## 2024-05-31 ENCOUNTER — Encounter: Payer: Self-pay | Admitting: Nurse Practitioner

## 2024-05-31 ENCOUNTER — Ambulatory Visit (INDEPENDENT_AMBULATORY_CARE_PROVIDER_SITE_OTHER): Admitting: Nurse Practitioner

## 2024-05-31 ENCOUNTER — Other Ambulatory Visit: Payer: Self-pay | Admitting: Nurse Practitioner

## 2024-05-31 VITALS — BP 122/74 | HR 60 | Temp 97.9°F | Ht 65.0 in | Wt 150.0 lb

## 2024-05-31 DIAGNOSIS — G4700 Insomnia, unspecified: Secondary | ICD-10-CM

## 2024-05-31 DIAGNOSIS — R809 Proteinuria, unspecified: Secondary | ICD-10-CM

## 2024-05-31 DIAGNOSIS — E785 Hyperlipidemia, unspecified: Secondary | ICD-10-CM

## 2024-05-31 DIAGNOSIS — F419 Anxiety disorder, unspecified: Secondary | ICD-10-CM

## 2024-05-31 DIAGNOSIS — I10 Essential (primary) hypertension: Secondary | ICD-10-CM | POA: Diagnosis not present

## 2024-05-31 DIAGNOSIS — F32A Depression, unspecified: Secondary | ICD-10-CM

## 2024-05-31 DIAGNOSIS — Z136 Encounter for screening for cardiovascular disorders: Secondary | ICD-10-CM

## 2024-05-31 DIAGNOSIS — R7303 Prediabetes: Secondary | ICD-10-CM

## 2024-05-31 DIAGNOSIS — Z122 Encounter for screening for malignant neoplasm of respiratory organs: Secondary | ICD-10-CM | POA: Diagnosis not present

## 2024-05-31 MED ORDER — BUPROPION HCL ER (SR) 100 MG PO TB12: 100.0000 mg | ORAL_TABLET | Freq: Two times a day (BID) | ORAL | 1 refills | Status: DC

## 2024-05-31 NOTE — Progress Notes (Signed)
 Established Patient Office Visit  Subjective:  Patient ID: Joshua Acevedo, male    DOB: 1960-12-23  Age: 63 y.o. MRN: [id number]  CC:  Chief Complaint  Patient presents with   Medical Management of Chronic Issues   Discussed the use of a AI scribe software for clinical note transcription with the patient, who gave verbal consent to proceed.  HPI  Joshua Acevedo presents for chronic disease management.  He experiences ongoing mental health issues since death of his wife leading to a reduction in activities over the past year. He has quit smoking and drinking, using a nicotine  patch for the past month. He takes Xanax  twice daily for anxiety and is open to alternative medications.  Trazodone  at 50 mg is ineffective for his sleep issues, with difficulty sleeping despite early bedtime and waking at 4:30 AM.  He takes losartan  and nifedipine  for high blood pressure. He notices foaming in his urine, raising concerns about proteinuria.  He maintains a stable appetite and weight, with an A1c of 5.5. He seeks a dermatology referral for new skin growths described as moles.  Colonoscopy overdue by five years. Lung cancer screening not completed. Discussed importance of completing these screenings.  HPI   Past Medical History:  Diagnosis Date   Arthritis    everywhere   GERD (gastroesophageal reflux disease)    Hepatitis 2009   c - treated   Hepatitis C    Hyperlipidemia    Hypertension    Shortness of breath dyspnea    Wears dentures    partial upper    Past Surgical History:  Procedure Laterality Date   COLONOSCOPY WITH PROPOFOL  N/A 08/28/2015   Procedure: COLONOSCOPY WITH PROPOFOL ;  Surgeon: Rogelia Copping, MD;  Location: [facility];  Service: Endoscopy;  Laterality: N/A;   NO PAST SURGERIES     POLYPECTOMY  08/28/2015   Procedure: POLYPECTOMY;  Surgeon: Rogelia Copping, MD;  Location: [facility];  Service: Endoscopy;;    Family History  Problem Relation Age of  Onset   Diabetes Paternal Grandfather    Heart attack Maternal Grandmother    Heart attack Maternal Grandfather    Hypertension Mother    Cataracts Mother    Diabetes Father    Coronary artery disease Father     Social History   Socioeconomic History   Marital status: Single    Spouse name: Not on file   Number of children: Not on file   Years of education: Not on file   Highest education level: 12th grade  Occupational History   Not on file  Tobacco Use   Smoking status: Some Days    Current packs/day: 0.75    Average packs/day: 0.7 packs/day for 40.8 years (30.6 ttl pk-yrs)    Types: Cigarettes    Start date: 04/23/2022   Smokeless tobacco: Never   Tobacco comments:    Using nicotine  patch from 1 month, not smoking since then, 05/31/24   Vaping Use   Vaping status: Never Used  Substance and Sexual Activity   Alcohol use: Not Currently    Comment: occasionally drink beer   Drug use: Not Currently    Comment: previously used marijuana, quit in 2009 when diagnosed with Hepatitis C   Sexual activity: Not on file  Other Topics Concern   Not on file  Social History Narrative   Not on file   Social Drivers of Health   Financial Resource Strain: Medium Risk (05/27/2024)   Overall Financial Resource  Strain (CARDIA)    Difficulty of Paying Living Expenses: Somewhat hard  Food Insecurity: No Food Insecurity (05/27/2024)   Hunger Vital Sign    Worried About Running Out of Food in the Last Year: Never true    Ran Out of Food in the Last Year: Never true  Transportation Needs: No Transportation Needs (05/27/2024)   PRAPARE - Administrator, Civil Service (Medical): No    Lack of Transportation (Non-Medical): No  Physical Activity: Unknown (05/27/2024)   Exercise Vital Sign    Days of Exercise per Week: 7 days    Minutes of Exercise per Session: Patient declined  Stress: Stress Concern Present (05/27/2024)   [employer] of Occupational Health - Occupational Stress  Questionnaire    Feeling of Stress: Very much  Social Connections: Socially Isolated (05/27/2024)   Social Connection and Isolation Panel    Frequency of Communication with Friends and Family: More than three times a week    Frequency of Social Gatherings with Friends and Family: Patient declined    Attends Religious Services: Patient declined    Database administrator or Organizations: No    Attends Engineer, structural: Not on file    Marital Status: Widowed  Catering manager Violence: Not on file     Outpatient Medications Prior to Visit  Medication Sig Dispense Refill   ALPRAZolam  (XANAX ) 0.25 MG tablet TAKE 1 TABLET BY MOUTH TWICE A DAY AS NEEDED FOR ANXIETY 30 tablet 0   ALPRAZolam  (XANAX ) 0.25 MG tablet Take 1 tablet (0.25 mg total) by mouth 2 (two) times daily as needed for anxiety. 60 tablet 1   atorvastatin  (LIPITOR) 20 MG tablet TAKE 1 TABLET BY MOUTH EVERY DAY IN THE EVENING 90 tablet 1   losartan  (COZAAR ) 25 MG tablet TAKE 1 TABLET (25 MG TOTAL) BY MOUTH DAILY. 30 tablet 11   metFORMIN  (GLUCOPHAGE ) 500 MG tablet TAKE 1 TABLET (500 MG TOTAL) BY MOUTH DAILY AT 2 PM. 30 tablet 3   nicotine  (NICODERM CQ  - DOSED IN MG/24 HOURS) 14 mg/24hr patch Place 1 patch (14 mg total) onto the skin daily. 90 patch 5   NIFEdipine  (PROCARDIA -XL/NIFEDICAL-XL) 30 MG 24 hr tablet Take 1 tablet (30 mg total) by mouth daily. 90 tablet 1   omeprazole  (PRILOSEC) 20 MG capsule TAKE 1 TABLET HALF AN HOUR BEFORE BREAKFAST DAILY. 90 capsule 1   traZODone  (DESYREL ) 50 MG tablet TAKE 1/2 TO 1 TABLET BY MOUTH AT BEDTIME AS NEEDED FOR SLEEP 90 tablet 2   ALPRAZolam  (XANAX ) 0.25 MG tablet TAKE 1 TABLET BY MOUTH TWICE A DAY AS NEEDED FOR ANXIETY 60 tablet 1   No facility-administered medications prior to visit.    No Known Allergies  ROS Review of Systems Negative unless indicated in HPI.    Objective:    Physical Exam Constitutional:      Appearance: Normal appearance.  HENT:      Mouth/Throat:     Mouth: Mucous membranes are moist.  Eyes:     Conjunctiva/sclera: Conjunctivae normal.     Pupils: Pupils are equal, round, and reactive to light.  Cardiovascular:     Rate and Rhythm: Normal rate and regular rhythm.     Pulses: Normal pulses.     Heart sounds: Normal heart sounds.  Pulmonary:     Effort: Pulmonary effort is normal.     Breath sounds: Normal breath sounds.  Abdominal:     General: Bowel sounds are normal.  Palpations: Abdomen is soft.  Musculoskeletal:     Cervical back: Normal range of motion. No tenderness.  Skin:    General: Skin is warm.     Findings: No bruising.  Neurological:     General: No focal deficit present.     Mental Status: He is alert and oriented to person, place, and time. Mental status is at baseline.  Psychiatric:        Mood and Affect: Mood normal.        Behavior: Behavior normal.        Thought Content: Thought content normal.        Judgment: Judgment normal.     BP 122/74   Pulse 60   Temp 97.9 F (36.6 C)   Ht 5' 5 (1.651 m)   Wt 150 lb (68 kg)   SpO2 99%   BMI 24.96 kg/m  Wt Readings from Last 3 Encounters:  05/31/24 150 lb (68 kg)  01/30/24 153 lb (69.4 kg)  09/28/23 154 lb 9.6 oz (70.1 kg)     Health Maintenance  Topic Date Due   Pneumococcal Vaccine: 50+ Years (1 of 2 - PCV) Never done   Lung Cancer Screening  Never done   Zoster Vaccines- Shingrix (1 of 2) Never done   Colonoscopy  08/27/2020   INFLUENZA VACCINE  01/21/2025 (Originally 05/24/2024)   DTaP/Tdap/Td (1 - Tdap) 01/29/2025 (Originally 08/19/1978)   Hepatitis B Vaccines 19-59 Average Risk  Completed   Hepatitis C Screening  Completed   HIV Screening  Completed   HPV VACCINES  Aged Out   Meningococcal B Vaccine  Aged Out   COVID-19 Vaccine  Discontinued    There are no preventive care reminders to display for this patient.  Lab Results  Component Value Date   TSH 1.75 03/09/2023   Lab Results  Component Value Date    WBC 6.0 06/07/2024   HGB 14.2 06/07/2024   HCT 41.5 06/07/2024   MCV 91.1 06/07/2024   PLT 227.0 06/07/2024   Lab Results  Component Value Date   NA 139 06/07/2024   K 4.5 06/07/2024   CO2 27 06/07/2024   GLUCOSE 103 (H) 06/07/2024   BUN 12 06/07/2024   CREATININE 1.09 06/07/2024   BILITOT 0.6 06/07/2024   ALKPHOS 79 06/07/2024   AST 15 06/07/2024   ALT 17 06/07/2024   PROT 7.0 06/07/2024   ALBUMIN 4.9 06/07/2024   CALCIUM  9.8 06/07/2024   ANIONGAP 8 05/20/2022   EGFR 90 05/10/2022   GFR 71.58 06/07/2024   Lab Results  Component Value Date   CHOL 234 (H) 06/07/2024   Lab Results  Component Value Date   HDL 39.70 06/07/2024   Lab Results  Component Value Date   LDLCALC 184 (H) 06/07/2024   Lab Results  Component Value Date   TRIG 50.0 06/07/2024   Lab Results  Component Value Date   CHOLHDL 6 06/07/2024   Lab Results  Component Value Date   HGBA1C 5.9 06/07/2024      Assessment & Plan:  Hypertension, unspecified type Assessment & Plan: Patient BP  Vitals:   05/31/24 1300  BP: 122/74    in the office. -Advised pt to follow a low sodium and heart healthy diet. -Continue losartan  and nifedipine . -Continue to monitor.  Orders: -     CBC with Differential/Platelet; Future -     Comprehensive metabolic panel with GFR; Future  Urine test positive for microalbuminuria Assessment & Plan: Microalbumin 4.6 on 01/30/2024.  Will recheck microalbumin.  Patient on losartan  - Advised patient to limit salt intake, exercise and smoking cessation and avoid NSAIDs. - Will recheck urine microalbumin.    Orders: -     Microalbumin / creatinine urine ratio; Future  Screening for lung cancer  Prediabetes Assessment & Plan: Previous hemoglobin A1c 5.5.  On metformin  500 mg daily -Continue metformin . -Discussed diet and exercise. - Will recheck hemoglobin A1c.  Orders: -     Hemoglobin A1c; Future  Hyperlipidemia, unspecified hyperlipidemia  type Assessment & Plan: Chronic on Lipitor 20 mg daily. -Will check lipid panel.  Orders: -     Lipid panel; Future  Encounter for screening for coronary artery disease -     CT CARDIAC SCORING (SELF PAY ONLY); Future  Anxiety and depression Assessment & Plan: He has ongoing mental health issues since death of his wife leading to a reduction in activities over the past year now. -We will start bupropion  for anxiety management and smoking cessation. - Reduce alprazolam  to once daily. - Monitor for anxiety symptoms and side effects.   Orders: -     buPROPion  HCl ER (SR); Take 1 tablet (100 mg total) by mouth 2 (two) times daily.  Dispense: 60 tablet; Refill: 1  Insomnia, unspecified type Assessment & Plan: Chronic insomnia with recent exacerbation. Current trazodone  dose of 50 mg is ineffective. - Increase trazodone  to 100 mg at bedtime.     Follow-up: Return for fasting lab next week.   Copper Basnett, NP

## 2024-06-04 ENCOUNTER — Ambulatory Visit
Admission: RE | Admit: 2024-06-04 | Discharge: 2024-06-04 | Disposition: A | Discharge status: Home / Self Care | Payer: Self-pay | Source: Ambulatory Visit | Attending: Nurse Practitioner | Admitting: Nurse Practitioner

## 2024-06-04 DIAGNOSIS — Z136 Encounter for screening for cardiovascular disorders: Secondary | ICD-10-CM | POA: Insufficient documentation

## 2024-06-07 ENCOUNTER — Other Ambulatory Visit (INDEPENDENT_AMBULATORY_CARE_PROVIDER_SITE_OTHER)

## 2024-06-07 DIAGNOSIS — I10 Essential (primary) hypertension: Secondary | ICD-10-CM | POA: Diagnosis not present

## 2024-06-07 DIAGNOSIS — R7303 Prediabetes: Secondary | ICD-10-CM

## 2024-06-07 DIAGNOSIS — R809 Proteinuria, unspecified: Secondary | ICD-10-CM

## 2024-06-07 DIAGNOSIS — E785 Hyperlipidemia, unspecified: Secondary | ICD-10-CM | POA: Diagnosis not present

## 2024-06-07 LAB — CBC WITH DIFFERENTIAL/PLATELET
Basophils Absolute: 0 K/uL (ref 0.0–0.1)
Basophils Relative: 0.3 % (ref 0.0–3.0)
Eosinophils Absolute: 0.1 K/uL (ref 0.0–0.7)
Eosinophils Relative: 1.2 % (ref 0.0–5.0)
HCT: 41.5 % (ref 39.0–52.0)
Hemoglobin: 14.2 g/dL (ref 13.0–17.0)
Lymphocytes Relative: 32.4 % (ref 12.0–46.0)
Lymphs Abs: 2 K/uL (ref 0.7–4.0)
MCHC: 34.2 g/dL (ref 30.0–36.0)
MCV: 91.1 fl (ref 78.0–100.0)
Monocytes Absolute: 0.6 K/uL (ref 0.1–1.0)
Monocytes Relative: 10.1 % (ref 3.0–12.0)
Neutro Abs: 3.4 K/uL (ref 1.4–7.7)
Neutrophils Relative %: 56 % (ref 43.0–77.0)
Platelets: 227 K/uL (ref 150.0–400.0)
RBC: 4.56 Mil/uL (ref 4.22–5.81)
RDW: 13 % (ref 11.5–15.5)
WBC: 6 K/uL (ref 4.0–10.5)

## 2024-06-07 LAB — COMPREHENSIVE METABOLIC PANEL WITH GFR
ALT: 17 U/L (ref 0–53)
AST: 15 U/L (ref 0–37)
Albumin: 4.9 g/dL (ref 3.5–5.2)
Alkaline Phosphatase: 79 U/L (ref 39–117)
BUN: 12 mg/dL (ref 6–23)
CO2: 27 meq/L (ref 19–32)
Calcium: 9.8 mg/dL (ref 8.4–10.5)
Chloride: 102 meq/L (ref 96–112)
Creatinine, Ser: 1.09 mg/dL (ref 0.40–1.50)
GFR: 71.58 mL/min (ref >60.00)
Glucose, Bld: 103 mg/dL — ABNORMAL HIGH (ref 70–99)
Potassium: 4.5 meq/L (ref 3.5–5.1)
Sodium: 139 meq/L (ref 135–145)
Total Bilirubin: 0.6 mg/dL (ref 0.2–1.2)
Total Protein: 7 g/dL (ref 6.0–8.3)

## 2024-06-07 LAB — MICROALBUMIN / CREATININE URINE RATIO
Creatinine,U: 173.8 mg/dL
Microalb Creat Ratio: 89.7 mg/g — ABNORMAL HIGH (ref 0.0–30.0)
Microalb, Ur: 15.6 mg/dL — ABNORMAL HIGH (ref 0.0–1.9)

## 2024-06-07 LAB — LIPID PANEL
Cholesterol: 234 mg/dL — ABNORMAL HIGH (ref 0–200)
HDL: 39.7 mg/dL (ref >39.00)
LDL Cholesterol: 184 mg/dL — ABNORMAL HIGH (ref 0–99)
NonHDL: 194.36
Total CHOL/HDL Ratio: 6
Triglycerides: 50 mg/dL (ref 0.0–149.0)
VLDL: 10 mg/dL (ref 0.0–40.0)

## 2024-06-07 LAB — HEMOGLOBIN A1C: Hgb A1c MFr Bld: 5.9 % (ref 4.6–6.5)

## 2024-06-22 ENCOUNTER — Ambulatory Visit: Payer: Self-pay | Admitting: Nurse Practitioner

## 2024-06-22 DIAGNOSIS — R809 Proteinuria, unspecified: Secondary | ICD-10-CM | POA: Insufficient documentation

## 2024-06-22 NOTE — Assessment & Plan Note (Signed)
 Chronic insomnia with recent exacerbation. Current trazodone  dose of 50 mg is ineffective. - Increase trazodone  to 100 mg at bedtime.

## 2024-06-22 NOTE — Progress Notes (Signed)
 Please inform patient: Coronary calcium  score is 166, that indicate moderate risk.  Will increase Lipitor from 20 to 40 mg and start low dose aspirin  81 mg.   No acute changes on CT chest.

## 2024-06-22 NOTE — Assessment & Plan Note (Signed)
 Microalbumin 4.6 on 01/30/2024.  Will recheck microalbumin.  Patient on losartan  - Advised patient to limit salt intake, exercise and smoking cessation and avoid NSAIDs. - Will recheck urine microalbumin.

## 2024-06-22 NOTE — Assessment & Plan Note (Signed)
 Chronic on Lipitor 20 mg daily. -Will check lipid panel.

## 2024-06-22 NOTE — Assessment & Plan Note (Signed)
 Patient BP  Vitals:   05/31/24 1300  BP: 122/74    in the office. -Advised pt to follow a low sodium and heart healthy diet. -Continue losartan  and nifedipine . -Continue to monitor.

## 2024-06-22 NOTE — Assessment & Plan Note (Signed)
 Previous hemoglobin A1c 5.5.  On metformin  500 mg daily -Continue metformin . -Discussed diet and exercise. - Will recheck hemoglobin A1c.

## 2024-06-22 NOTE — Assessment & Plan Note (Signed)
 He has ongoing mental health issues since death of his wife leading to a reduction in activities over the past year now. -We will start bupropion  for anxiety management and smoking cessation. - Reduce alprazolam  to once daily. - Monitor for anxiety symptoms and side effects.

## 2024-06-23 ENCOUNTER — Other Ambulatory Visit: Payer: Self-pay | Admitting: Nurse Practitioner

## 2024-06-23 DIAGNOSIS — F32A Depression, unspecified: Secondary | ICD-10-CM

## 2024-06-25 NOTE — Telephone Encounter (Signed)
 Copied from CRM [id number]. Topic: General - Other >> Jun 25, 2024  9:14 AM Robinson H wrote: Reason for CRM: Patient returning call to office please reach out, thanks.  Ravis [phone] Please ask the secretary to page patient and she'll page him he's at work

## 2024-07-24 ENCOUNTER — Other Ambulatory Visit: Payer: Self-pay | Admitting: Nurse Practitioner

## 2024-07-30 ENCOUNTER — Other Ambulatory Visit: Payer: Self-pay | Admitting: Nurse Practitioner

## 2024-07-31 NOTE — Telephone Encounter (Signed)
 Please schedule office appointment in a month

## 2024-08-21 ENCOUNTER — Other Ambulatory Visit: Payer: Self-pay | Admitting: Nurse Practitioner

## 2024-09-22 ENCOUNTER — Other Ambulatory Visit: Payer: Self-pay | Admitting: Nurse Practitioner

## 2024-09-27 ENCOUNTER — Ambulatory Visit: Admitting: Nurse Practitioner

## 2024-09-27 ENCOUNTER — Encounter: Payer: Self-pay | Admitting: Nurse Practitioner

## 2024-09-27 VITALS — BP 122/74 | HR 69 | Temp 98.0°F | Ht 65.0 in | Wt 152.6 lb

## 2024-09-27 DIAGNOSIS — R809 Proteinuria, unspecified: Secondary | ICD-10-CM

## 2024-09-27 DIAGNOSIS — E785 Hyperlipidemia, unspecified: Secondary | ICD-10-CM

## 2024-09-27 DIAGNOSIS — F419 Anxiety disorder, unspecified: Secondary | ICD-10-CM

## 2024-09-27 DIAGNOSIS — R7303 Prediabetes: Secondary | ICD-10-CM

## 2024-09-27 DIAGNOSIS — K5900 Constipation, unspecified: Secondary | ICD-10-CM

## 2024-09-27 DIAGNOSIS — I10 Essential (primary) hypertension: Secondary | ICD-10-CM

## 2024-09-27 NOTE — Progress Notes (Unsigned)
 Established Patient Office Visit  Subjective:  Patient ID: Joshua Acevedo, male    DOB: 04/21/1961  Age: 63 y.o. MRN: [id number]  CC:  Chief Complaint  Patient presents with   Medical Management of Chronic Issues   Discussed the use of AI scribe software for clinical note transcription with the patient, who gave verbal consent to proceed.  History of Present Illness   History of Present Illness   Joshua Acevedo is a 63 year old male with hypertension and diabetes who presents with urinary issues and back pain.  For the past three weeks he has had weak urine stream, difficulty initiating urination, intermittent frequency, and occasional urge without being able to void. He denies dysuria, increased frequency, or urgency. He notices foamy urine at times.  He has intermittent low back pain that he worries is from his kidneys. It is located in the lower back, tender to touch, worse in the mornings, and worsened by movement. Ibuprofen provides partial relief.  He has alternating constipation and diarrhea. He uses Dulcolax for constipation, which sometimes leads to a couple of days of diarrhea before symptoms return to constipation.  For the past couple of weeks he has had an unusual sensation in the right ear, described as something crawling out, which improves with pressing on the ear. No recent trauma.  He takes losartan  and nifedipine  for hypertension, metformin  for diabetes, and trazodone  for sleep. He uses Xanax  0.25 mg as needed, not daily. He has quit smoking and is not using nicotine  replacement.     Past Medical History:  Diagnosis Date   Arthritis    everywhere   GERD (gastroesophageal reflux disease)    Hepatitis 2009   c - treated   Hepatitis C    Hyperlipidemia    Hypertension    Shortness of breath dyspnea    Wears dentures    partial upper    Past Surgical History:  Procedure Laterality Date   COLONOSCOPY WITH PROPOFOL  N/A 08/28/2015   Procedure:  COLONOSCOPY WITH PROPOFOL ;  Surgeon: Rogelia Copping, MD;  Location: [facility];  Service: Endoscopy;  Laterality: N/A;   NO PAST SURGERIES     POLYPECTOMY  08/28/2015   Procedure: POLYPECTOMY;  Surgeon: Rogelia Copping, MD;  Location: [facility];  Service: Endoscopy;;    Family History  Problem Relation Age of Onset   Diabetes Paternal Grandfather    Heart attack Maternal Grandmother    Heart attack Maternal Grandfather    Hypertension Mother    Cataracts Mother    Diabetes Father    Coronary artery disease Father     Social History   Socioeconomic History   Marital status: Single    Spouse name: Not on file   Number of children: Not on file   Years of education: Not on file   Highest education level: 12th grade  Occupational History   Not on file  Tobacco Use   Smoking status: Some Days    Current packs/day: 0.75    Average packs/day: 0.8 packs/day for 41.0 years (30.8 ttl pk-yrs)    Types: Cigarettes    Start date: 04/23/2022   Smokeless tobacco: Never   Tobacco comments:    Using nicotine  patch from 1 month, not smoking since then, 05/31/24   Vaping Use   Vaping status: Never Used  Substance and Sexual Activity   Alcohol use: Not Currently    Comment: occasionally drink beer   Drug use: Not Currently  Comment: previously used marijuana, quit in 2009 when diagnosed with Hepatitis C   Sexual activity: Not on file  Other Topics Concern   Not on file  Social History Narrative   Not on file   Social Drivers of Health   Financial Resource Strain: Medium Risk (05/27/2024)   Overall Financial Resource Strain (CARDIA)    Difficulty of Paying Living Expenses: Somewhat hard  Food Insecurity: No Food Insecurity (05/27/2024)   Hunger Vital Sign    Worried About Running Out of Food in the Last Year: Never true    Ran Out of Food in the Last Year: Never true  Transportation Needs: No Transportation Needs (05/27/2024)   PRAPARE - Scientist, Research (physical Sciences) (Medical): No    Lack of Transportation (Non-Medical): No  Physical Activity: Unknown (05/27/2024)   Exercise Vital Sign    Days of Exercise per Week: 7 days    Minutes of Exercise per Session: Patient declined  Stress: Stress Concern Present (05/27/2024)   [employer] of Occupational Health - Occupational Stress Questionnaire    Feeling of Stress: Very much  Social Connections: Socially Isolated (05/27/2024)   Social Connection and Isolation Panel    Frequency of Communication with Friends and Family: More than three times a week    Frequency of Social Gatherings with Friends and Family: Patient declined    Attends Religious Services: Patient declined    Database Administrator or Organizations: No    Attends Engineer, Structural: Not on file    Marital Status: Widowed  Intimate Partner Violence: Not on file     Outpatient Medications Prior to Visit  Medication Sig Dispense Refill   ALPRAZolam  (XANAX ) 0.25 MG tablet Take 1 tablet (0.25 mg total) by mouth 2 (two) times daily as needed for anxiety. 60 tablet 1   ALPRAZolam  (XANAX ) 0.25 MG tablet TAKE 1 TABLET BY MOUTH AT BEDTIME AS NEEDED FOR ANXIETY. 30 tablet 1   atorvastatin  (LIPITOR) 20 MG tablet TAKE 1 TABLET BY MOUTH EVERY DAY IN THE EVENING 30 tablet 5   buPROPion  ER (WELLBUTRIN  SR) 100 MG 12 hr tablet TAKE 1 TABLET BY MOUTH TWICE A DAY 180 tablet 1   losartan  (COZAAR ) 25 MG tablet TAKE 1 TABLET (25 MG TOTAL) BY MOUTH DAILY. 30 tablet 11   metFORMIN  (GLUCOPHAGE ) 500 MG tablet TAKE 1 TABLET (500 MG TOTAL) BY MOUTH DAILY AT 2 PM. 30 tablet 3   NIFEdipine  (PROCARDIA -XL/NIFEDICAL-XL) 30 MG 24 hr tablet TAKE 1 TABLET BY MOUTH EVERY DAY 30 tablet 5   omeprazole  (PRILOSEC) 20 MG capsule TAKE 1 TABLET HALF AN HOUR BEFORE BREAKFAST DAILY. 90 capsule 1   traZODone  (DESYREL ) 50 MG tablet TAKE 1/2 TO 1 TABLET BY MOUTH AT BEDTIME AS NEEDED FOR SLEEP 90 tablet 2   ALPRAZolam  (XANAX ) 0.25 MG tablet TAKE 1 TABLET BY MOUTH  TWICE A DAY AS NEEDED FOR ANXIETY 30 tablet 0   nicotine  (NICODERM CQ  - DOSED IN MG/24 HOURS) 14 mg/24hr patch Place 1 patch (14 mg total) onto the skin daily. 90 patch 5   No facility-administered medications prior to visit.    No Known Allergies  ROS Review of Systems Negative unless indicated in HPI.    Objective:    Physical Exam  BP 122/74   Pulse 69   Temp 98 F (36.7 C)   Ht 5' 5 (1.651 m)   Wt 152 lb 9.6 oz (69.2 kg)   SpO2 98%   BMI  25.39 kg/m  Wt Readings from Last 3 Encounters:  09/27/24 152 lb 9.6 oz (69.2 kg)  05/31/24 150 lb (68 kg)  01/30/24 153 lb (69.4 kg)     Health Maintenance  Topic Date Due   Pneumococcal Vaccine: 50+ Years (1 of 2 - PCV) Never done   Lung Cancer Screening  Never done   Zoster Vaccines- Shingrix (1 of 2) Never done   Colonoscopy  08/27/2020   Influenza Vaccine  01/21/2025 (Originally 05/24/2024)   DTaP/Tdap/Td (1 - Tdap) 01/29/2025 (Originally 08/19/1978)   Hepatitis B Vaccines 19-59 Average Risk  Completed   Hepatitis C Screening  Completed   HIV Screening  Completed   Meningococcal B Vaccine  Aged Out   COVID-19 Vaccine  Discontinued    There are no preventive care reminders to display for this patient.  Lab Results  Component Value Date   TSH 1.14 09/27/2024   Lab Results  Component Value Date   WBC 6.2 09/27/2024   HGB 14.5 09/27/2024   HCT 42.8 09/27/2024   MCV 91.8 09/27/2024   PLT 256 09/27/2024   Lab Results  Component Value Date   NA 139 09/27/2024   K 4.3 09/27/2024   CO2 29 09/27/2024   GLUCOSE 84 09/27/2024   BUN 13 09/27/2024   CREATININE 0.97 09/27/2024   BILITOT 0.4 09/27/2024   ALKPHOS 79 06/07/2024   AST 13 09/27/2024   ALT 18 09/27/2024   PROT 7.1 09/27/2024   ALBUMIN 4.9 06/07/2024   CALCIUM  10.2 09/27/2024   ANIONGAP 8 05/20/2022   EGFR 87 09/27/2024   GFR 71.58 06/07/2024   Lab Results  Component Value Date   CHOL 239 (H) 09/27/2024   Lab Results  Component Value Date   HDL  41 09/27/2024   Lab Results  Component Value Date   LDLCALC 171 (H) 09/27/2024   Lab Results  Component Value Date   TRIG 133 09/27/2024   Lab Results  Component Value Date   CHOLHDL 5.8 (H) 09/27/2024   Lab Results  Component Value Date   HGBA1C 5.4 09/27/2024      Assessment & Plan:   Assessment & Plan Urine test positive for microalbuminuria  Orders:   Microalbumin / creatinine urine ratio  Constipation, unspecified constipation type     Anxiety and depression     Hypertension, unspecified type  Orders:   CBC with Differential/Platelet   Comprehensive metabolic panel with GFR   TSH  Hyperlipidemia, unspecified hyperlipidemia type  Orders:   Lipid panel  Prediabetes  Orders:   HgB A1c  Assessment and Plan    Renal dysfunction with microalbuminuria and urinary symptoms Intermittent low back pain and urinary symptoms.  - Obtained urine specimen for analysis. - Referred to nephrology for further evaluation.  Constipation Intermittent constipation with diarrhea post-Dulcolax use. - Continue stool softeners and laxatives as needed.  Cerumen impaction Bilateral cerumen impaction causing ear discomfort. - If unsuccessful, use wax removal drops and schedule nurse follow-up for ear cleaning.  Essential hypertension Hypertension managed with losartan  and nifedipine . Discussed cost-saving measures for medications. - Continue losartan  and nifedipine . - Instructed to inquire about discount coupons for medications.  Prediabetes Managed with metformin . - Continue metformin .  Anxiety and depression Prescribed trazodone  for sleep and Xanax  for anxiety, taken as needed. Discussed need to wean off Xanax . - Wean off Xanax .       Assessment and Plan Assessment & Plan       Follow-up: No follow-ups on file.  Yaretzy Olazabal, NP

## 2024-09-27 NOTE — Assessment & Plan Note (Signed)
 SABRA

## 2024-09-28 LAB — CBC WITH DIFFERENTIAL/PLATELET
Absolute Lymphocytes: 2393 {cells}/uL (ref 850–3900)
Absolute Monocytes: 471 {cells}/uL (ref 200–950)
Basophils Absolute: 19 {cells}/uL (ref 0–200)
Basophils Relative: 0.3 %
Eosinophils Absolute: 99 {cells}/uL (ref 15–500)
Eosinophils Relative: 1.6 %
HCT: 42.8 % (ref 39.4–51.1)
Hemoglobin: 14.5 g/dL (ref 13.2–17.1)
MCH: 31.1 pg (ref 27.0–33.0)
MCHC: 33.9 g/dL (ref 31.6–35.4)
MCV: 91.8 fL (ref 81.4–101.7)
MPV: 10.3 fL (ref 7.5–12.5)
Monocytes Relative: 7.6 %
Neutro Abs: 3218 {cells}/uL (ref 1500–7800)
Neutrophils Relative %: 51.9 %
Platelets: 256 Thousand/uL (ref 140–400)
RBC: 4.66 Million/uL (ref 4.20–5.80)
RDW: 12.2 % (ref 11.0–15.0)
Total Lymphocyte: 38.6 %
WBC: 6.2 Thousand/uL (ref 3.8–10.8)

## 2024-09-28 LAB — HEMOGLOBIN A1C
Hgb A1c MFr Bld: 5.4 % (ref <5.7)
Mean Plasma Glucose: 108 mg/dL
eAG (mmol/L): 6 mmol/L

## 2024-09-28 LAB — LIPID PANEL
Cholesterol: 239 mg/dL — ABNORMAL HIGH (ref <200)
HDL: 41 mg/dL (ref >=40)
LDL Cholesterol (Calc): 171 mg/dL — ABNORMAL HIGH
Non-HDL Cholesterol (Calc): 198 mg/dL — ABNORMAL HIGH (ref <130)
Total CHOL/HDL Ratio: 5.8 (calc) — ABNORMAL HIGH (ref <5.0)
Triglycerides: 133 mg/dL (ref <150)

## 2024-09-28 LAB — MICROALBUMIN / CREATININE URINE RATIO
Creatinine, Urine: 121 mg/dL (ref 20–320)
Microalb Creat Ratio: 32 mg/g{creat} — ABNORMAL HIGH (ref <30)
Microalb, Ur: 3.9 mg/dL

## 2024-09-28 LAB — COMPREHENSIVE METABOLIC PANEL WITH GFR
AG Ratio: 2.1 (calc) (ref 1.0–2.5)
ALT: 18 U/L (ref 9–46)
AST: 13 U/L (ref 10–35)
Albumin: 4.8 g/dL (ref 3.6–5.1)
Alkaline phosphatase (APISO): 66 U/L (ref 35–144)
BUN: 13 mg/dL (ref 7–25)
CO2: 29 mmol/L (ref 20–32)
Calcium: 10.2 mg/dL (ref 8.6–10.3)
Chloride: 102 mmol/L (ref 98–110)
Creat: 0.97 mg/dL (ref 0.70–1.35)
Globulin: 2.3 g/dL (ref 1.9–3.7)
Glucose, Bld: 84 mg/dL (ref 65–99)
Potassium: 4.3 mmol/L (ref 3.5–5.3)
Sodium: 139 mmol/L (ref 135–146)
Total Bilirubin: 0.4 mg/dL (ref 0.2–1.2)
Total Protein: 7.1 g/dL (ref 6.1–8.1)
eGFR: 87 mL/min/1.73m2 (ref >=60)

## 2024-09-28 LAB — TSH: TSH: 1.14 m[IU]/L (ref 0.40–4.50)

## 2024-09-30 ENCOUNTER — Ambulatory Visit: Payer: Self-pay | Admitting: Nurse Practitioner

## 2024-09-30 NOTE — Progress Notes (Signed)
 Please inform patient: Urine microalbumin improved compared to the previous labs.  Advised patient to continue losartan  25 mg for kidney protection and blood pressure control.  Total cholesterol and LDL elevated.  Continue taking Lipitor 20 mg daily.  Manage diet and exercise.  Liver, kidney, thyroid, electrolytes and hemoglobin A1c stable.

## 2024-10-01 ENCOUNTER — Other Ambulatory Visit: Payer: Self-pay | Admitting: Nurse Practitioner

## 2024-10-01 NOTE — Assessment & Plan Note (Signed)
 SABRA

## 2024-10-01 NOTE — Telephone Encounter (Signed)
Controlled substance database reviewed.  Medication sent to pharmacy.

## 2024-10-01 NOTE — Assessment & Plan Note (Signed)
  Orders:   HgB A1c

## 2024-10-01 NOTE — Progress Notes (Signed)
 Noted

## 2024-10-01 NOTE — Assessment & Plan Note (Signed)
 Orders:    Lipid panel

## 2024-10-01 NOTE — Assessment & Plan Note (Signed)
  Orders:   CBC with Differential/Platelet   Comprehensive metabolic panel with GFR   TSH

## 2024-10-01 NOTE — Progress Notes (Signed)
 Xanax  was refilled on 09/27/24

## 2024-10-02 MED ORDER — TAMSULOSIN HCL 0.4 MG PO CAPS: 0.4000 mg | ORAL_CAPSULE | Freq: Every day | ORAL | 3 refills | Status: AC

## 2024-10-02 NOTE — Assessment & Plan Note (Signed)
 Patient has intermittent low back pain that worsens with the movement.  Back pain improves with ibuprofen. -Advised patient to use ibuprofen and heating pad. -Will let us  know if symptoms not improving.

## 2024-10-20 ENCOUNTER — Other Ambulatory Visit: Payer: Self-pay | Admitting: Nurse Practitioner

## 2024-11-08 ENCOUNTER — Ambulatory Visit (INDEPENDENT_AMBULATORY_CARE_PROVIDER_SITE_OTHER): Payer: Self-pay | Admitting: Nurse Practitioner

## 2024-11-08 ENCOUNTER — Encounter: Payer: Self-pay | Admitting: Nurse Practitioner

## 2024-11-08 VITALS — BP 116/68 | HR 71 | Temp 97.9°F | Ht 65.0 in | Wt 152.2 lb

## 2024-11-08 DIAGNOSIS — F419 Anxiety disorder, unspecified: Secondary | ICD-10-CM | POA: Diagnosis not present

## 2024-11-08 DIAGNOSIS — K5900 Constipation, unspecified: Secondary | ICD-10-CM

## 2024-11-08 DIAGNOSIS — R399 Unspecified symptoms and signs involving the genitourinary system: Secondary | ICD-10-CM | POA: Diagnosis not present

## 2024-11-08 DIAGNOSIS — I10 Essential (primary) hypertension: Secondary | ICD-10-CM

## 2024-11-08 DIAGNOSIS — F32A Depression, unspecified: Secondary | ICD-10-CM

## 2024-11-08 DIAGNOSIS — E7800 Pure hypercholesterolemia, unspecified: Secondary | ICD-10-CM

## 2024-11-08 DIAGNOSIS — Z860100 Personal history of colon polyps, unspecified: Secondary | ICD-10-CM

## 2024-11-08 DIAGNOSIS — Z1211 Encounter for screening for malignant neoplasm of colon: Secondary | ICD-10-CM

## 2024-11-08 DIAGNOSIS — E785 Hyperlipidemia, unspecified: Secondary | ICD-10-CM | POA: Diagnosis not present

## 2024-11-08 DIAGNOSIS — M2061 Acquired deformities of toe(s), unspecified, right foot: Secondary | ICD-10-CM

## 2024-11-08 MED ORDER — OMEPRAZOLE 20 MG PO CPDR: DELAYED_RELEASE_CAPSULE | ORAL | 1 refills | Status: AC

## 2024-11-08 MED ORDER — SERTRALINE HCL 25 MG PO TABS: 25.0000 mg | ORAL_TABLET | Freq: Every day | ORAL | 3 refills | Status: AC

## 2024-11-08 NOTE — Progress Notes (Unsigned)
 "  Established Patient Office Visit  Subjective:  Patient ID: Joshua Acevedo, male    DOB: [birth year]  Age: 64 y.o. MRN: [id number]  CC:  Chief Complaint  Patient presents with   Medical Management of Chronic Issues  Discussed the use of AI scribe software for clinical note transcription with the patient, who gave verbal consent to proceed. History of Present Illness   History of Present Illness   Joshua Acevedo is a 64 year old male who presents for chronic disease follow up.  He has had a change in bowel habits since December, with decreased regularity and constipation that often requires Dulcolax to produce a bowel movement, followed by diarrhea. He previously had a formed morning bowel movement. He has not been taking Benefiber and has not changed his diet. He associates the change with starting Buspar, which he takes twice daily.  He has new urinary symptoms with a weaker stream and difficulty initiating urination. He denies urgency or frequency. He was started on Flomax  but has taken it inconsistently over the past week. He previously urinated without difficulty. He declined urinary consult at present.   He notes a longstanding knot on his right foot that has recently become painful and is affecting his walking. He would like to see podiatry for it.   He reports increased irritability and anxiety despite treatment with Wellbutrin  and Buspar. He previously took Xanax . He attributes some of his symptoms to significant financial stress and concern about losing his car. He quit drinking and smoking several months ago but does not feel his mood has improved.       Past Medical History:  Diagnosis Date   Arthritis    everywhere   GERD (gastroesophageal reflux disease)    Hepatitis 2009   c - treated   Hepatitis C    Hyperlipidemia    Hypertension    Shortness of breath dyspnea    Wears dentures    partial upper    Past Surgical History:  Procedure Laterality Date    COLONOSCOPY WITH PROPOFOL  N/A 08/28/2015   Procedure: COLONOSCOPY WITH PROPOFOL ;  Surgeon: Rogelia Copping, MD;  Location: [facility];  Service: Endoscopy;  Laterality: N/A;   NO PAST SURGERIES     POLYPECTOMY  08/28/2015   Procedure: POLYPECTOMY;  Surgeon: Rogelia Copping, MD;  Location: [facility];  Service: Endoscopy;;    Family History  Problem Relation Age of Onset   Diabetes Paternal Grandfather    Heart attack Maternal Grandmother    Heart attack Maternal Grandfather    Hypertension Mother    Cataracts Mother    Diabetes Father    Coronary artery disease Father     Social History   Socioeconomic History   Marital status: Single    Spouse name: Not on file   Number of children: Not on file   Years of education: Not on file   Highest education level: 12th grade  Occupational History   Not on file  Tobacco Use   Smoking status: Former    Current packs/day: 0.75    Average packs/day: 0.8 packs/day for 41.2 years (30.9 ttl pk-yrs)    Types: Cigarettes    Start date: 04/23/2022   Smokeless tobacco: Never   Tobacco comments:    Using nicotine  patch from 1 month, not smoking since then, 05/31/24   Vaping Use   Vaping status: Never Used  Substance and Sexual Activity   Alcohol use: Not Currently    Comment:  occasionally drink beer   Drug use: Not Currently    Comment: previously used marijuana, quit in 2009 when diagnosed with Hepatitis C   Sexual activity: Not on file  Other Topics Concern   Not on file  Social History Narrative   Not on file   Social Drivers of Health   Tobacco Use: Medium Risk (11/14/2024)   Patient History    Smoking Tobacco Use: Former    Smokeless Tobacco Use: Never    Passive Exposure: Not on file  Financial Resource Strain: Medium Risk (11/05/2024)   Overall Financial Resource Strain (CARDIA)    Difficulty of Paying Living Expenses: Somewhat hard  Food Insecurity: No Food Insecurity (11/05/2024)   Epic    Worried About Patent Examiner in the Last Year: Never true    Ran Out of Food in the Last Year: Never true  Transportation Needs: No Transportation Needs (11/05/2024)   Epic    Lack of Transportation (Medical): No    Lack of Transportation (Non-Medical): No  Physical Activity: Unknown (11/05/2024)   Exercise Vital Sign    Days of Exercise per Week: 5 days    Minutes of Exercise per Session: Not on file  Stress: No Stress Concern Present (11/05/2024)   [employer] of Occupational Health - Occupational Stress Questionnaire    Feeling of Stress: Only a little  Social Connections: Socially Isolated (11/05/2024)   Social Connection and Isolation Panel    Frequency of Communication with Friends and Family: Three times a week    Frequency of Social Gatherings with Friends and Family: Patient declined    Attends Religious Services: Patient declined    Active Member of Clubs or Organizations: No    Attends Engineer, Structural: Not on file    Marital Status: Widowed  Intimate Partner Violence: Not on file  Depression (PHQ2-9): Low Risk (11/08/2024)   Depression (PHQ2-9)    PHQ-2 Score: 4  Alcohol Screen: Low Risk (05/27/2024)   Alcohol Screen    Last Alcohol Screening Score (AUDIT): 5  Housing: Unknown (11/05/2024)   Epic    Unable to Pay for Housing in the Last Year: No    Number of Times Moved in the Last Year: Not on file    Homeless in the Last Year: No  Utilities: Not on file  Health Literacy: Not on file     Outpatient Medications Prior to Visit  Medication Sig Dispense Refill   ALPRAZolam  (XANAX ) 0.25 MG tablet TAKE 1 TABLET BY MOUTH TWICE A DAY AS NEEDED FOR ANXIETY 30 tablet 0   ALPRAZolam  (XANAX ) 0.25 MG tablet Take 1 tablet (0.25 mg total) by mouth 2 (two) times daily as needed for anxiety. 60 tablet 1   ALPRAZolam  (XANAX ) 0.25 MG tablet TAKE 1 TABLET BY MOUTH AT BEDTIME AS NEEDED FOR ANXIETY. 30 tablet 1   atorvastatin  (LIPITOR) 20 MG tablet TAKE 1 TABLET BY MOUTH EVERY DAY IN THE  EVENING 30 tablet 5   buPROPion  ER (WELLBUTRIN  SR) 100 MG 12 hr tablet TAKE 1 TABLET BY MOUTH TWICE A DAY 180 tablet 1   losartan  (COZAAR ) 25 MG tablet TAKE 1 TABLET (25 MG TOTAL) BY MOUTH DAILY. 30 tablet 11   nicotine  (NICODERM CQ  - DOSED IN MG/24 HOURS) 14 mg/24hr patch Place 1 patch (14 mg total) onto the skin daily. 90 patch 5   NIFEdipine  (PROCARDIA -XL/NIFEDICAL-XL) 30 MG 24 hr tablet TAKE 1 TABLET BY MOUTH EVERY DAY 30 tablet 5   tamsulosin  (FLOMAX )  0.4 MG CAPS capsule Take 1 capsule (0.4 mg total) by mouth daily. 30 capsule 3   metFORMIN  (GLUCOPHAGE ) 500 MG tablet TAKE 1 TABLET (500 MG TOTAL) BY MOUTH DAILY AT 2 PM. 30 tablet 3   omeprazole  (PRILOSEC) 20 MG capsule TAKE 1 TABLET HALF AN HOUR BEFORE BREAKFAST DAILY. 90 capsule 1   traZODone  (DESYREL ) 50 MG tablet TAKE 1/2 TO 1 TABLET BY MOUTH AT BEDTIME AS NEEDED FOR SLEEP 30 tablet 8   No facility-administered medications prior to visit.    Allergies[1]  ROS Review of Systems Negative unless indicated in HPI.    Objective:    Physical Exam Constitutional:      Appearance: Normal appearance.  HENT:     Mouth/Throat:     Mouth: Mucous membranes are moist.  Eyes:     Conjunctiva/sclera: Conjunctivae normal.     Pupils: Pupils are equal, round, and reactive to light.  Cardiovascular:     Rate and Rhythm: Normal rate and regular rhythm.     Pulses: Normal pulses.     Heart sounds: Normal heart sounds.  Pulmonary:     Effort: Pulmonary effort is normal.     Breath sounds: Normal breath sounds.  Abdominal:     General: Bowel sounds are normal.     Palpations: Abdomen is soft.  Musculoskeletal:     Cervical back: Normal range of motion. No tenderness.  Skin:    General: Skin is warm.     Findings: No bruising.  Neurological:     General: No focal deficit present.     Mental Status: He is alert and oriented to person, place, and time. Mental status is at baseline.  Psychiatric:        Mood and Affect: Mood normal.         Behavior: Behavior normal.        Thought Content: Thought content normal.        Judgment: Judgment normal.     BP 116/68   Pulse 71   Temp 97.9 F (36.6 C)   Ht 5' 5 (1.651 m)   Wt 152 lb 3.2 oz (69 kg)   SpO2 99%   BMI 25.33 kg/m  Wt Readings from Last 3 Encounters:  11/08/24 152 lb 3.2 oz (69 kg)  09/27/24 152 lb 9.6 oz (69.2 kg)  05/31/24 150 lb (68 kg)     Health Maintenance  Topic Date Due   Medicare Annual Wellness (AWV)  Never done   Pneumococcal Vaccine: 50+ Years (1 of 2 - PCV) Never done   Lung Cancer Screening  Never done   Zoster Vaccines- Shingrix (1 of 2) Never done   Colonoscopy  08/27/2020   Influenza Vaccine  01/21/2025 (Originally 05/24/2024)   DTaP/Tdap/Td (1 - Tdap) 01/29/2025 (Originally 08/19/1978)   Hepatitis B Vaccines 19-59 Average Risk  Completed   Hepatitis C Screening  Completed   HIV Screening  Completed   Meningococcal B Vaccine  Aged Out   COVID-19 Vaccine  Discontinued    There are no preventive care reminders to display for this patient.  Lab Results  Component Value Date   TSH 1.14 09/27/2024   Lab Results  Component Value Date   WBC 6.2 09/27/2024   HGB 14.5 09/27/2024   HCT 42.8 09/27/2024   MCV 91.8 09/27/2024   PLT 256 09/27/2024   Lab Results  Component Value Date   NA 139 09/27/2024   K 4.3 09/27/2024   CO2 29 09/27/2024  GLUCOSE 84 09/27/2024   BUN 13 09/27/2024   CREATININE 0.97 09/27/2024   BILITOT 0.4 09/27/2024   ALKPHOS 79 06/07/2024   AST 13 09/27/2024   ALT 18 09/27/2024   PROT 7.1 09/27/2024   ALBUMIN 4.9 06/07/2024   CALCIUM  10.2 09/27/2024   ANIONGAP 8 05/20/2022   EGFR 87 09/27/2024   GFR 71.58 06/07/2024   Lab Results  Component Value Date   CHOL 239 (H) 09/27/2024   Lab Results  Component Value Date   HDL 41 09/27/2024   Lab Results  Component Value Date   LDLCALC 171 (H) 09/27/2024   Lab Results  Component Value Date   TRIG 133 09/27/2024   Lab Results  Component Value  Date   CHOLHDL 5.8 (H) 09/27/2024   Lab Results  Component Value Date   HGBA1C 5.4 09/27/2024      Assessment & Plan:   Assessment & Plan Hypertension, unspecified type Chronic stable. -Continue losartan  25 mg and nifideipine daily.     Hyperlipidemia, unspecified hyperlipidemia type Chronic. -Continue lipitor 20 mg daily.     History of colon polyps Colonoscopy needed, transportation issues at for [facility] location. - Sent referral for colonoscopy to [facility] in [place].  Orders:   Ambulatory referral to Gastroenterology  Constipation, unspecified constipation type Chronic constipation with alternating diarrhea,  - Recommended Benefiber for bowel regulation. - Advised mixing Benefiber with coffee, juice, or water . - Encouraged increased fluid intake and consumption of fruits and vegetables.    Lower urinary tract symptoms (LUTS) Decreased urinary stream and irregular urination. Pt not taking flomax  as prescribed. - Pt declined referred to urology for further evaluation.     Anxiety and depression Increased anxiety and irritability due to significant financial stress and concern about losing his car.  On Wellbutrin  twice a day and as needed xanax  0.25 mg.  - Continue Wellbutrin  as prescribed. - Will add zoloft  25 mg.     Acquired deformity of joint of big toe of right foot Chronic bump on the  dorsal aspect of right big toe MTP joint. Pain affect daily activities.  - Documented picture in the chart.  - Referred to foot specialist. Orders:   Ambulatory referral to Podiatry   Assessment & Plan       Follow-up: Return in about 3 months (around 02/06/2025).   Chelsea Aurora, NP     [1] No Known Allergies  "

## 2024-11-08 NOTE — Assessment & Plan Note (Signed)
 SABRA

## 2024-11-08 NOTE — Patient Instructions (Signed)
 During your visit, we addressed your constipation, urinary symptoms, anxiety, and a painful knot on your right foot. We also discussed the need for a colonoscopy.  YOUR PLAN:  CONSTIPATION: You have been experiencing chronic constipation with alternating diarrhea, possibly due to Buspar. -Start taking Benefiber regularly to help regulate your bowel movements. You can mix it with coffee, juice, or water . -Increase your fluid intake and eat more fruits and vegetables.  ANXIETY DISORDER: You have increased anxiety and irritability, possibly exacerbated by Buspar. -Start taking Zoloft  as prescribed. -Continue taking Wellbutrin  as prescribed.  RIGHT FOOT SOFT TISSUE MASS: You have a painful knot on your right foot that is affecting your walking. -You have been referred to a foot specialist for further evaluation.  COLORECTAL CANCER SCREENING: You need a colonoscopy for colorectal cancer screening. -A referral for a colonoscopy has been sent to [facility] in [place].

## 2024-11-08 NOTE — Assessment & Plan Note (Addendum)
Chronic ?Continue lipitor 20 mg daily ? ? ?

## 2024-11-08 NOTE — Assessment & Plan Note (Addendum)
 Chronic stable. -Continue losartan  25 mg and nifideipine daily.

## 2024-11-13 ENCOUNTER — Other Ambulatory Visit: Payer: Self-pay | Admitting: Family

## 2024-11-13 ENCOUNTER — Other Ambulatory Visit: Payer: Self-pay | Admitting: Nurse Practitioner

## 2024-11-13 NOTE — Telephone Encounter (Signed)
 Medication refill.

## 2024-11-14 ENCOUNTER — Encounter: Payer: Self-pay | Admitting: Nurse Practitioner

## 2024-11-14 DIAGNOSIS — M2061 Acquired deformities of toe(s), unspecified, right foot: Secondary | ICD-10-CM | POA: Insufficient documentation

## 2024-11-14 DIAGNOSIS — R399 Unspecified symptoms and signs involving the genitourinary system: Secondary | ICD-10-CM | POA: Insufficient documentation

## 2024-11-14 DIAGNOSIS — K5900 Constipation, unspecified: Secondary | ICD-10-CM | POA: Insufficient documentation

## 2024-11-14 NOTE — Assessment & Plan Note (Signed)
 Chronic constipation with alternating diarrhea,  - Recommended Benefiber for bowel regulation. - Advised mixing Benefiber with coffee, juice, or water . - Encouraged increased fluid intake and consumption of fruits and vegetables.

## 2024-11-14 NOTE — Assessment & Plan Note (Signed)
 Chronic bump on the  dorsal aspect of right big toe MTP joint. Pain affect daily activities.  - Documented picture in the chart.  - Referred to foot specialist. Orders:   Ambulatory referral to Podiatry

## 2024-11-14 NOTE — Assessment & Plan Note (Signed)
 Decreased urinary stream and irregular urination. Pt not taking flomax  as prescribed. - Pt declined referred to urology for further evaluation.

## 2024-11-14 NOTE — Assessment & Plan Note (Signed)
 Increased anxiety and irritability due to significant financial stress and concern about losing his car.  On Wellbutrin  twice a day and as needed xanax  0.25 mg.  - Continue Wellbutrin  as prescribed. - Will add zoloft  25 mg.

## 2024-11-19 ENCOUNTER — Ambulatory Visit: Payer: Self-pay | Admitting: Podiatry

## 2024-12-03 ENCOUNTER — Ambulatory Visit: Admitting: Podiatry

## 2025-02-06 ENCOUNTER — Ambulatory Visit: Admitting: Nurse Practitioner
